# Patient Record
Sex: Female | Born: 1960 | Race: White | Hispanic: No | Smoking: Current every day smoker
Health system: Southern US, Community
[De-identification: ages and names within clinical notes are randomized; demographics above are authoritative.]

## PROBLEM LIST (undated history)

## (undated) DIAGNOSIS — K2970 Gastritis, unspecified, without bleeding: Secondary | ICD-10-CM

## (undated) DIAGNOSIS — R05 Cough: Secondary | ICD-10-CM

## (undated) DIAGNOSIS — M659 Unspecified synovitis and tenosynovitis, unspecified site: Secondary | ICD-10-CM

## (undated) DIAGNOSIS — K30 Functional dyspepsia: Secondary | ICD-10-CM

## (undated) DIAGNOSIS — H269 Unspecified cataract: Secondary | ICD-10-CM

## (undated) DIAGNOSIS — A048 Other specified bacterial intestinal infections: Secondary | ICD-10-CM

## (undated) DIAGNOSIS — I10 Essential (primary) hypertension: Secondary | ICD-10-CM

## (undated) DIAGNOSIS — F419 Anxiety disorder, unspecified: Secondary | ICD-10-CM

## (undated) DIAGNOSIS — T7840XA Allergy, unspecified, initial encounter: Secondary | ICD-10-CM

## (undated) DIAGNOSIS — J40 Bronchitis, not specified as acute or chronic: Secondary | ICD-10-CM

## (undated) DIAGNOSIS — K219 Gastro-esophageal reflux disease without esophagitis: Secondary | ICD-10-CM

## (undated) DIAGNOSIS — F17200 Nicotine dependence, unspecified, uncomplicated: Secondary | ICD-10-CM

## (undated) DIAGNOSIS — F32A Depression, unspecified: Secondary | ICD-10-CM

## (undated) DIAGNOSIS — K648 Other hemorrhoids: Secondary | ICD-10-CM

## (undated) DIAGNOSIS — K8050 Calculus of bile duct without cholangitis or cholecystitis without obstruction: Secondary | ICD-10-CM

## (undated) DIAGNOSIS — K635 Polyp of colon: Secondary | ICD-10-CM

## (undated) DIAGNOSIS — F1020 Alcohol dependence, uncomplicated: Secondary | ICD-10-CM

## (undated) DIAGNOSIS — F329 Major depressive disorder, single episode, unspecified: Secondary | ICD-10-CM

## (undated) DIAGNOSIS — K644 Residual hemorrhoidal skin tags: Secondary | ICD-10-CM

## (undated) DIAGNOSIS — E871 Hypo-osmolality and hyponatremia: Secondary | ICD-10-CM

## (undated) HISTORY — DX: Other specified bacterial intestinal infections: A04.8

## (undated) HISTORY — PX: APPENDECTOMY: SHX54

## (undated) HISTORY — DX: Unspecified cataract: H26.9

## (undated) HISTORY — DX: Gastritis, unspecified, without bleeding: K29.70

## (undated) HISTORY — DX: Cough: R05

## (undated) HISTORY — DX: Depression, unspecified: F32.A

## (undated) HISTORY — DX: Bronchitis, not specified as acute or chronic: J40

## (undated) HISTORY — DX: Unspecified synovitis and tenosynovitis, unspecified site: M65.90

## (undated) HISTORY — DX: Synovitis and tenosynovitis, unspecified: M65.9

## (undated) HISTORY — DX: Gastro-esophageal reflux disease without esophagitis: K21.9

## (undated) HISTORY — DX: Major depressive disorder, single episode, unspecified: F32.9

## (undated) HISTORY — DX: Anxiety disorder, unspecified: F41.9

## (undated) HISTORY — DX: Hypo-osmolality and hyponatremia: E87.1

## (undated) HISTORY — DX: Other hemorrhoids: K64.8

## (undated) HISTORY — DX: Polyp of colon: K63.5

## (undated) HISTORY — DX: Residual hemorrhoidal skin tags: K64.4

## (undated) HISTORY — DX: Allergy, unspecified, initial encounter: T78.40XA

## (undated) HISTORY — DX: Functional dyspepsia: K30

## (undated) HISTORY — DX: Nicotine dependence, unspecified, uncomplicated: F17.200

## (undated) HISTORY — DX: Alcohol dependence, uncomplicated: F10.20

---

## 1998-11-23 ENCOUNTER — Other Ambulatory Visit: Admission: RE | Admit: 1998-11-23 | Discharge: 1998-11-23 | Payer: Self-pay | Admitting: Obstetrics & Gynecology

## 2003-12-02 ENCOUNTER — Other Ambulatory Visit: Admission: RE | Admit: 2003-12-02 | Discharge: 2003-12-02 | Payer: Self-pay | Admitting: Obstetrics & Gynecology

## 2004-03-12 HISTORY — PX: OTHER SURGICAL HISTORY: SHX169

## 2004-04-09 ENCOUNTER — Ambulatory Visit (HOSPITAL_COMMUNITY): Admission: RE | Admit: 2004-04-09 | Discharge: 2004-04-10 | Payer: Self-pay | Admitting: Neurosurgery

## 2004-05-27 ENCOUNTER — Encounter: Admission: RE | Admit: 2004-05-27 | Discharge: 2004-05-27 | Payer: Self-pay | Admitting: Neurosurgery

## 2004-07-15 ENCOUNTER — Encounter: Admission: RE | Admit: 2004-07-15 | Discharge: 2004-07-15 | Payer: Self-pay | Admitting: Neurosurgery

## 2004-08-26 ENCOUNTER — Encounter: Admission: RE | Admit: 2004-08-26 | Discharge: 2004-08-26 | Payer: Self-pay | Admitting: Neurosurgery

## 2006-11-09 ENCOUNTER — Ambulatory Visit (HOSPITAL_COMMUNITY): Admission: RE | Admit: 2006-11-09 | Discharge: 2006-11-09 | Payer: Self-pay | Admitting: *Deleted

## 2006-11-09 ENCOUNTER — Encounter (INDEPENDENT_AMBULATORY_CARE_PROVIDER_SITE_OTHER): Payer: Self-pay | Admitting: Specialist

## 2007-12-09 ENCOUNTER — Encounter: Admission: RE | Admit: 2007-12-09 | Discharge: 2007-12-09 | Payer: Self-pay | Admitting: Neurosurgery

## 2008-03-18 ENCOUNTER — Ambulatory Visit (HOSPITAL_COMMUNITY): Admission: RE | Admit: 2008-03-18 | Discharge: 2008-03-18 | Payer: Self-pay | Admitting: *Deleted

## 2008-03-18 ENCOUNTER — Encounter (INDEPENDENT_AMBULATORY_CARE_PROVIDER_SITE_OTHER): Payer: Self-pay | Admitting: *Deleted

## 2009-03-10 ENCOUNTER — Encounter: Admission: RE | Admit: 2009-03-10 | Discharge: 2009-03-10 | Payer: Self-pay | Admitting: Internal Medicine

## 2009-10-29 ENCOUNTER — Encounter: Admission: RE | Admit: 2009-10-29 | Discharge: 2009-10-29 | Payer: Self-pay | Admitting: Obstetrics & Gynecology

## 2010-06-07 ENCOUNTER — Emergency Department (HOSPITAL_COMMUNITY): Admission: EM | Admit: 2010-06-07 | Discharge: 2010-06-08 | Payer: Self-pay | Admitting: Emergency Medicine

## 2010-10-14 ENCOUNTER — Other Ambulatory Visit: Payer: Self-pay | Admitting: Obstetrics and Gynecology

## 2010-11-19 ENCOUNTER — Other Ambulatory Visit: Payer: Self-pay | Admitting: Occupational Medicine

## 2010-11-19 ENCOUNTER — Ambulatory Visit: Payer: Self-pay

## 2010-11-19 DIAGNOSIS — R52 Pain, unspecified: Secondary | ICD-10-CM

## 2010-11-25 LAB — COMPREHENSIVE METABOLIC PANEL
ALT: 19 U/L (ref 0–35)
AST: 27 U/L (ref 0–37)
Albumin: 4.2 g/dL (ref 3.5–5.2)
Alkaline Phosphatase: 35 U/L — ABNORMAL LOW (ref 39–117)
BUN: 4 mg/dL — ABNORMAL LOW (ref 6–23)
CO2: 20 mEq/L (ref 19–32)
Calcium: 8.7 mg/dL (ref 8.4–10.5)
Chloride: 98 mEq/L (ref 96–112)
Creatinine, Ser: 0.62 mg/dL (ref 0.4–1.2)
GFR calc Af Amer: >60 mL/min (ref >60)
GFR calc non Af Amer: >60 mL/min (ref >60)
Glucose, Bld: 74 mg/dL (ref 70–99)
Potassium: 3.6 mEq/L (ref 3.5–5.1)
Sodium: 132 mEq/L — ABNORMAL LOW (ref 135–145)
Total Bilirubin: 0.7 mg/dL (ref 0.3–1.2)
Total Protein: 6.9 g/dL (ref 6.0–8.3)

## 2010-11-25 LAB — URINALYSIS, ROUTINE W REFLEX MICROSCOPIC
Bilirubin Urine: NEGATIVE
Glucose, UA: NEGATIVE mg/dL
Ketones, ur: NEGATIVE mg/dL
Leukocytes, UA: NEGATIVE
Nitrite: NEGATIVE
Protein, ur: NEGATIVE mg/dL
Specific Gravity, Urine: 1.005 (ref 1.005–1.030)
Urobilinogen, UA: 0.2 mg/dL (ref 0.0–1.0)
pH: 6 (ref 5.0–8.0)

## 2010-11-25 LAB — URINE MICROSCOPIC-ADD ON

## 2010-11-25 LAB — POCT CARDIAC MARKERS
CKMB, poc: <1 ng/mL — ABNORMAL LOW (ref 1.0–8.0)
CKMB, poc: <1 ng/mL — ABNORMAL LOW (ref 1.0–8.0)
Myoglobin, poc: 22.7 ng/mL (ref 12–200)
Myoglobin, poc: 25.8 ng/mL (ref 12–200)
Troponin i, poc: <0.05 ng/mL (ref 0.00–0.09)
Troponin i, poc: <0.05 ng/mL (ref 0.00–0.09)

## 2010-11-25 LAB — RAPID URINE DRUG SCREEN, HOSP PERFORMED
Amphetamines: NOT DETECTED
Barbiturates: NOT DETECTED
Benzodiazepines: POSITIVE — AB
Cocaine: NOT DETECTED
Opiates: NOT DETECTED
Tetrahydrocannabinol: NOT DETECTED

## 2010-11-25 LAB — CBC
HCT: 40.9 % (ref 36.0–46.0)
Hemoglobin: 14.4 g/dL (ref 12.0–15.0)
MCH: 30.8 pg (ref 26.0–34.0)
MCHC: 35.2 g/dL (ref 30.0–36.0)
MCV: 87.4 fL (ref 78.0–100.0)
Platelets: 396 10*3/uL (ref 150–400)
RBC: 4.68 MIL/uL (ref 3.87–5.11)
RDW: 13.4 % (ref 11.5–15.5)
WBC: 12 10*3/uL — ABNORMAL HIGH (ref 4.0–10.5)

## 2010-11-25 LAB — D-DIMER, QUANTITATIVE: D-Dimer, Quant: 0.38 ug/mL-FEU (ref 0.00–0.48)

## 2010-12-09 ENCOUNTER — Observation Stay (HOSPITAL_COMMUNITY): Admit: 2010-12-09 | Payer: Self-pay | Admitting: Internal Medicine

## 2010-12-09 ENCOUNTER — Emergency Department (HOSPITAL_COMMUNITY)
Admission: EM | Admit: 2010-12-09 | Discharge: 2010-12-09 | Disposition: A | Discharge status: Home / Self Care | Payer: BC Managed Care – PPO | Attending: Emergency Medicine | Admitting: Emergency Medicine

## 2010-12-09 DIAGNOSIS — F3289 Other specified depressive episodes: Secondary | ICD-10-CM | POA: Insufficient documentation

## 2010-12-09 DIAGNOSIS — F329 Major depressive disorder, single episode, unspecified: Secondary | ICD-10-CM | POA: Insufficient documentation

## 2010-12-10 ENCOUNTER — Inpatient Hospital Stay (HOSPITAL_COMMUNITY)
Admission: AD | Admit: 2010-12-10 | Discharge: 2010-12-14 | DRG: 751 | Disposition: A | Discharge status: Home / Self Care | Payer: BC Managed Care – PPO | Source: Ambulatory Visit | Attending: Internal Medicine | Admitting: Internal Medicine

## 2010-12-10 ENCOUNTER — Inpatient Hospital Stay (HOSPITAL_COMMUNITY): Payer: BC Managed Care – PPO

## 2010-12-10 DIAGNOSIS — F329 Major depressive disorder, single episode, unspecified: Secondary | ICD-10-CM | POA: Diagnosis present

## 2010-12-10 DIAGNOSIS — Z981 Arthrodesis status: Secondary | ICD-10-CM

## 2010-12-10 DIAGNOSIS — R030 Elevated blood-pressure reading, without diagnosis of hypertension: Secondary | ICD-10-CM | POA: Diagnosis present

## 2010-12-10 DIAGNOSIS — F1020 Alcohol dependence, uncomplicated: Principal | ICD-10-CM | POA: Diagnosis present

## 2010-12-10 DIAGNOSIS — M542 Cervicalgia: Secondary | ICD-10-CM | POA: Diagnosis present

## 2010-12-10 DIAGNOSIS — G4700 Insomnia, unspecified: Secondary | ICD-10-CM | POA: Diagnosis present

## 2010-12-10 DIAGNOSIS — F17200 Nicotine dependence, unspecified, uncomplicated: Secondary | ICD-10-CM | POA: Diagnosis present

## 2010-12-10 DIAGNOSIS — F3289 Other specified depressive episodes: Secondary | ICD-10-CM | POA: Diagnosis present

## 2010-12-10 DIAGNOSIS — F10239 Alcohol dependence with withdrawal, unspecified: Secondary | ICD-10-CM | POA: Diagnosis present

## 2010-12-10 DIAGNOSIS — F10939 Alcohol use, unspecified with withdrawal, unspecified: Secondary | ICD-10-CM | POA: Diagnosis present

## 2010-12-10 DIAGNOSIS — K219 Gastro-esophageal reflux disease without esophagitis: Secondary | ICD-10-CM | POA: Diagnosis present

## 2010-12-10 LAB — BASIC METABOLIC PANEL
BUN: 3 mg/dL — ABNORMAL LOW (ref 6–23)
CO2: 23 mEq/L (ref 19–32)
Chloride: 97 mEq/L (ref 96–112)
Creatinine, Ser: 0.41 mg/dL (ref 0.4–1.2)
Potassium: 4.2 mEq/L (ref 3.5–5.1)

## 2010-12-10 LAB — RAPID URINE DRUG SCREEN, HOSP PERFORMED
Amphetamines: NOT DETECTED
Barbiturates: NOT DETECTED
Benzodiazepines: NOT DETECTED
Cocaine: NOT DETECTED
Opiates: NOT DETECTED
Tetrahydrocannabinol: NOT DETECTED

## 2010-12-10 LAB — DIFFERENTIAL
Basophils Absolute: 0 10*3/uL (ref 0.0–0.1)
Basophils Relative: 0 % (ref 0–1)
Eosinophils Absolute: 0.1 10*3/uL (ref 0.0–0.7)
Eosinophils Relative: 1 % (ref 0–5)
Lymphocytes Relative: 30 % (ref 12–46)
Lymphs Abs: 3.6 10*3/uL (ref 0.7–4.0)
Monocytes Absolute: 0.8 10*3/uL (ref 0.1–1.0)
Monocytes Relative: 7 % (ref 3–12)
Neutro Abs: 7.5 10*3/uL (ref 1.7–7.7)
Neutrophils Relative %: 62 % (ref 43–77)

## 2010-12-10 LAB — CBC
HCT: 42.9 % (ref 36.0–46.0)
Hemoglobin: 14.7 g/dL (ref 12.0–15.0)
MCH: 29.3 pg (ref 26.0–34.0)
MCHC: 34.3 g/dL (ref 30.0–36.0)
MCV: 85.6 fL (ref 78.0–100.0)
Platelets: 377 10*3/uL (ref 150–400)
RBC: 5.01 MIL/uL (ref 3.87–5.11)
RDW: 13.9 % (ref 11.5–15.5)
WBC: 12.2 10*3/uL — ABNORMAL HIGH (ref 4.0–10.5)

## 2010-12-10 LAB — ETHANOL: Alcohol, Ethyl (B): 62 mg/dL — ABNORMAL HIGH (ref 0–10)

## 2010-12-10 LAB — ACETAMINOPHEN LEVEL: Acetaminophen (Tylenol), Serum: <10 ug/mL — ABNORMAL LOW (ref 10–30)

## 2010-12-10 LAB — SALICYLATE LEVEL: Salicylate Lvl: <4 mg/dL (ref 2.8–20.0)

## 2010-12-10 LAB — POCT PREGNANCY, URINE: Preg Test, Ur: NEGATIVE

## 2010-12-11 DIAGNOSIS — F1020 Alcohol dependence, uncomplicated: Secondary | ICD-10-CM

## 2010-12-13 NOTE — Consult Note (Signed)
Selena Farrell, Selena Farrell               ACCOUNT NO.:  [id number]  MEDICAL RECORD NO.:  [id number]           PATIENT TYPE:  I  LOCATION:  1414                         FACILITY:  [facility]  PHYSICIAN:  Eulogio Ditch, MD DATE OF BIRTH:  [birth year]  DATE OF CONSULTATION:  12/11/2010 DATE OF DISCHARGE:                                CONSULTATION   REASON FOR CONSULTATION:  Alcohol dependence and suicidal ideations.  HISTORY OF PRESENT ILLNESS:  A 50 year old white female with history of alcohol dependence.  The patient told me she is regularly drinking since 6 months for 6-7 beers per day.  The patient reported that she is feeling depressed after the death of the husband 4-1/2 years ago. Earlier she was started on Wellbutrin which did not help her, later on she was put on Lexapro which helped her, but she started having increased anxiety and decreased sleep.  The patient is very logical and goal directed during the interview.  She told me that she has ongoing conflict with the brother-in-law.  The patient denied suicidal ideation and she told me that she will never kill herself.  The patient told me that she takes care of her granddaughter who is 35-1/51-year-old and she loves taking care of her and she is one reason that she is never going to hurt herself.  The patient denied any past psych hospitalization or suicide attempt. The patient denies abuse of any illicit drugs.  The patient denies hearing any voices or any visual hallucinations. Denied any manic symptoms.  No flight of ideas present during the interview.  PAST MEDICAL HISTORY:  History of hypertension, GERD.  ALLERGIES:  The patient is allergic to, 1. ASPIRIN. 2. CODEINE. 3. CELEBREX. 4. PREDNISONE.  CURRENT PSYCHIATRIC MEDICATIONS:  The patient is on temazepam 50 mg at bedtime as needed for sleep.  The patient is on Librium detox.  MENTAL STATUS EXAMINATION:  The patient is very calm, cooperative during the  interview, pleasant on approach.  No psychomotor agitation retardation noted during the interview.  No tremors present in the hands.  Hygiene, grooming fair.  Speech normal in rate, rhythm, and volume.  Mood "okay," affect mood congruent.  Thought process, logical and goal directed.  Thought content, not suicidal or homicidal, not delusional. Thought perception, no audiovisual hallucinations reported, not internally preoccupied.  Cognition alert, awake, oriented x3. Memory immediate, recent, remote fair.  Attention and concentration good.  Abstraction ability good.  Insight and judgment intact.  Fund of knowledge fair.  DIAGNOSES:  Axis I:  Chronic alcohol dependence, depressive disorder not otherwise specified, rule out major depressive disorder recurrent type. Axis II:  Deferred. Axis III:  See medical notes. Axis IV:  Chronic alcohol abuse. Axis V:  50.  RECOMMENDATIONS: 1. The patient can be continued on Librium at this time. 2. The patient can be started on Celexa 20 mg p.o. daily for     depressive symptoms. 3. The patient wants counseling in the outpatient setting.  She do not     want any inpatient rehab at this time. 4. Psychoeducation given regarding alcohol abuse.  Thank you for involving me  in taking care of this patient.     Eulogio Ditch, MD     SA/MEDQ  D:  12/11/2010  T:  12/11/2010  Job:  [id number]  Electronically Signed by Eulogio Ditch  on 12/13/2010 02:45:30 PM

## 2010-12-23 NOTE — Discharge Summary (Signed)
NAMELATAJA, NEWLAND               ACCOUNT NO.:  [id number]  MEDICAL RECORD NO.:  [id number]           PATIENT TYPE:  I  LOCATION:  1414                         FACILITY:  [facility]  PHYSICIAN:  Massie Maroon, MD        DATE OF BIRTH:  Jan 29, 1961  DATE OF ADMISSION:  12/10/2010 DATE OF DISCHARGE:  12/14/2010                              DISCHARGE SUMMARY   DISCHARGE DIAGNOSES: 1. Alcohol dependence/in remission. 2. Minor alcohol withdrawal. 3. Depression. 4. Elevated blood pressure without diagnosis of hypertension. 5. Gastroesophageal reflux disease with mild erosions. 6. History of gastritis. 7. Appendectomy. 8. C-section. 9. Neck surgery. 10.Colonoscopy, April 26, 1994 - normal. 11.EGD, April 26, 1994, mild erosions/gastritis.  DISCHARGE MEDICATIONS: 1. Citalopram 20 mg p.o. daily. 2. Vitamin B12 1000 mcg p.o. daily. 3. Folic acid 1 mg p.o. daily. 4. Nicotine patch 21 mg topically daily. 5. Pregabalin 75 mg p.o. b.i.d. 6. Thiamine 100 mg p.o. daily. 7. Tramadol 50 mg p.o. q.i.d. p.r.n. 8. Ativan 0.5 mg p.o. q.6 h. p.r.n. anxiety. 9. Temazepam 15 mg 1-2 p.o. nightly p.r.n. insomnia. 10.Protonix 40 mg p.o. daily  HOSPITAL COURSE:  A 50 year old female with depression over the fact that she is facing financial stress as well as emotional stress from her family.  She is also alcohol dependent and wanted to have detox. The patient did not want psychiatric detox, so admitted to medical floor for detox from alcoholism.  The patient had some shaking over the course of the first 2-3 days and since then has felt better.  She has not had any delirium or seizures.  The patient was initiated on Librium taper and then also covered with Ativan IV.  She has typically been requiring Ativan 1 mg IV t.i.d. to q.i.d. over the last 2 days to help with her shaking.  The patient is also a smoker and has tried to quit smoking during this time.  The patient feels much better.  She has  received Celexa from Psychiatry and feels improved.  She did not voice any suicidal or homicidal ideation at this time.  The patient lives with her daughter and grandchild and has a job and is highly functional.  She has rental property as well.  There is no prior history of suicide.  No family history of suicide in her family according to the patient.  The patient is low risk for suicide at this time.  The patient will follow up with Dr. Pearson Grippe in 1 week for followup of alcohol withdrawal and dependence, now in remission.  PHYSICAL EXAMINATION:  VITAL SIGNS:  Temperature 98.5, pulse 71, blood pressure 109/70, pulse oximetry is 97% on room air. HEENT:  Anicteric. NECK:  No JVD. HEART:  Regular rate and rhythm, S1 and S2.  No murmurs, gallops, or rubs. LUNGS:  Clear to auscultation bilaterally. ABDOMEN:  Soft.  No hepatosplenomegaly. EXTREMITIES:  No cyanosis, clubbing, or edema. SKIN:  No palmar erythema.  No telangiectasias.  No asterixis.  Positive rosacea.  LABORATORY DATA:  On December 09, 2010, WBC 12.2, hemoglobin 14.7, platelet count 377.  Sodium 131, potassium 4.2, BUN  3, creatinine 0.41.  Alcohol level initially 62.  Tylenol level less than 10.0.  Salicylate level less than 4.  Urine pregnancy test was negative.  X-ray of the C-spine showed ACIF at C5-C6 with lucency at the inferior screws, concerning for loosening, comparison to a prior imaging would be helpful to evaluate for stability (I spoke with Dr. Jordan Likes, her Neurosurgeon, on the 2nd day of admission and he recommended that she have outpatient followup.  He reviewed the x-rays and did not feel any surgical emergency at this time.  We obviously appreciate his input).  ASSESSMENT AND PLAN: 1. Alcohol dependence in remission, with minimal symptoms of alcohol     withdrawal during detox, continue Ativan 0.5 mg p.o. q.6 h. p.r.n. anxiety. 1. Neck pain:  Please follow up with Dr. Jordan Likes, your neurosurgeon.     Continue  pregabalin 75 mg p.o. b.i.d. for neck pain and continue     tramadol 50 mg p.o. q.i.d. p.r.n. pain. 2. Tobacco dependence:  The patient was counseled for 10 minutes on     smoking cessation.  She will be prescribed a nicotine patch for     symptoms of nicotine dependence. 3. Depression.  Continue citalopram 20 mg p.o. daily.  Please call to     find a psychiatrist within the next week.  The patient has a list     of psychiatrists that are covered by her insurance.  DISPOSITION:  Follow up with Dr. Pearson Grippe in 1 week.     Massie Maroon, MD     JYK/MEDQ  D:  12/14/2010  T:  12/15/2010  Job:  [id number]  Electronically Signed by Pearson Grippe MD on 12/23/2010 01:50:20 AM

## 2010-12-23 NOTE — H&P (Signed)
Selena Farrell, Selena Farrell               ACCOUNT NO.:  [id number]  MEDICAL RECORD NO.:  [id number]           PATIENT TYPE:  I  LOCATION:  1414                         FACILITY:  [facility]  PHYSICIAN:  Massie Maroon, MD        DATE OF BIRTH:  08-28-1961  DATE OF ADMISSION:  12/10/2010 DATE OF DISCHARGE:                             HISTORY & PHYSICAL   CHIEF COMPLAINT:  Alcohol dependence, "I want to quit drinking."  HISTORY OF PRESENT ILLNESS:  Alcohol Dependence:  The patient states that she wants to quit drinking.  She would like to stop now, but she knows that she will have symptoms of withdrawal.  She would like inpatient admission to have detox as well as evaluation by Psychiatry because of depression.  It is difficult to say whether her depression is from her alcohol abuse or whether she truly has depression at this time. She has thought in the past of taking pills, but does not have any apparent present thoughts of suicidal ideation or homicidal ideation. She slept all yesterday with Librium and has not drunk for 24 hours. The patient also notes some neck pain.  She thinks it is from stress and there is no radiation of the pain down her arms.  She has had prior neck surgery.  She denies any weakness, numbness, tingling.  PAST MEDICAL HISTORY: 1. Elevated BP without diagnosis of hypertension. 2. GERD with mild erosions. 3. History of gastritis.  PAST SURGICAL HISTORY: 1. Appendectomy. 2. C-section. 3. Neck surgery. 4. Colonoscopy April 26, 1994 - normal. 5. EGD April 26, 1994 - mild erosions and gastritis.  SOCIAL HISTORY:  Tobacco Use:  One pack per day for 26 years, alcohol use heavy.  Occupation:  Works at [employer].  Born: [place], [place].  Religion:  [employer].  FAMILY HISTORY:  Father died when the patient was 50 and had hypertension.  Her mother had a stroke at age 69 and passed at the age of 25.  Her maternal aunt had diabetes.  Her  husband passed away from lung cancer, PE, and blood clots.  One sister had a brain aneurysm and she has a family history of alcoholism.  Her father in particular drank heavily.  ALLERGIES: 1. ASPIRIN. 2. CODEINE. 3. CELEBREX. 4. PREDNISONE.  MEDICATIONS: 1. Aciphex 20 mg p.o. daily. 2. Temazepam 50 mg p.o. q.h.s. p.r.n. 3. Ambien 10 mg p.o. q.h.s. p.r.n.  REVIEW OF SYSTEMS:  Negative for all 10-organ systems except for pertinent positives stated above.  PHYSICAL EXAMINATION:  VITAL SIGNS:  Height 5 feet 4-1/2 inches, weight 135 pounds, pulse 84, blood pressure 124/74. HEENT:  Anicteric. NECK:  No JVD, no bruit, no thyromegaly, no adenopathy. HEART:  Regular rate and rhythm, S1, S2.  No murmurs, gallops, or rubs. LUNGS:  Clear to auscultation bilaterally. ABDOMEN:  Soft, nontender, nondistended.  Positive bowel sounds. EXTREMITIES:  No cyanosis, clubbing, or edema. SKIN:  No palmar erythema, no rashes. LYMPH NODES:  No adenopathy. NEUROLOGIC:  Nonfocal.  No asterixis.  LABORATORY DATA:  Alcohol level yesterday December 09, 2010, 65.ASSESSMENT: 1. Alcohol dependence. 2. Depression.  3. Elevated blood pressure without history of hypertension. 4. Gastroesophageal reflux disease/gastritis. 5. Tobacco dependence.  PLAN:  The patient will be placed on Librium 50 mg p.o. daily for 1 day, then 25 mg p.o. daily for 1 day and also on Ativan IV CIWA protocol.  We will consult Psychiatry in the a.m. in regard to depression and possible suggestions on how to manage her depression.  She has been on Lexapro in the past, but this did not seem to agree with her.  We appreciate our psychiatry colleagues input.     Massie Maroon, MD     JYK/MEDQ  D:  12/11/2010  T:  12/11/2010  Job:  [id number]  Electronically Signed by Pearson Grippe MD on 12/23/2010 01:50:29 AM

## 2011-01-25 NOTE — Op Note (Signed)
NAMEQUIERA, Selena Farrell               ACCOUNT NO.:  [id number]   MEDICAL RECORD NO.:  [id number]          PATIENT TYPE:  AMB   LOCATION:  ENDO                         FACILITY:  [facility]   PHYSICIAN:  Georgiana Spinner, M.D.    DATE OF BIRTH:  09/03/1961   DATE OF PROCEDURE:  03/18/2008  DATE OF DISCHARGE:                               OPERATIVE REPORT   PROCEDURE:  Upper endoscopy.   INDICATIONS:  GERD.   ANESTHESIA:  1. Fentanyl 60 mcg.  2. Versed 6 mg.   PROCEDURE:  With the patient mildly sedated in the left lateral  decubitus position, the Pentax videoscopic endoscope was inserted in the  mouth and passed under direct vision through the esophagus, which  appeared normal, until we reached the distal esophagus.  There appeared  to be islands of Barrett's esophagus, photographed, and biopsies were  taken.  The endoscope was advanced into stomach through a hiatal hernia.  Fundus, body, antrum, duodenal bulb, and second portion of the duodenum  all appeared normal.  From this point, the endoscope was slowly  withdrawn, taking circumferential views of duodenal mucosa, until the  endoscope had been pulled back into the stomach and placed in  retroflexion to view the stomach from below.  The endoscope was then  straightened and withdrawn, taking circumferential views of the  remaining gastric and esophageal mucosa.  The patient's vital signs and  pulse oximeter remained stable.  The patient tolerated the procedure  well, without apparent complications.   FINDINGS:  Small islands of Barrett's esophagus.  Biopsies taken above a  hiatal hernia.  Await biopsy report.  The patient will call me for  results and follow up with me as an outpatient.           ______________________________  Georgiana Spinner, M.D.     GMO/MEDQ  D:  03/18/2008  T:  03/18/2008  Job:  [id number]

## 2011-01-28 NOTE — Op Note (Signed)
[name], Selena Farrell               ACCOUNT NO.:  [id number]   MEDICAL RECORD NO.:  [id number]          PATIENT TYPE:  AMB   LOCATION:  ENDO                         FACILITY:  [facility]   PHYSICIAN:  Georgiana Spinner, M.D.    DATE OF BIRTH:  April 19, 1961   DATE OF PROCEDURE:  11/09/2006  DATE OF DISCHARGE:                               OPERATIVE REPORT   PROCEDURE:  Upper endoscopy.   INDICATIONS:  GERD.   ANESTHESIA:  Fentanyl 75 mcg, Versed 7.5 mg.   PROCEDURE:  With patient mildly sedated in the left lateral decubitus  position, the Pentax videoscopic endoscope was inserted in the mouth,  passed under direct vision through the esophagus, which appeared normal  until we reached the distal esophagus and there appeared to be changes  of islands of Barrett, photographed and biopsied.  We entered into the  stomach.  The fundus, body, antrum appeared normal.  The duodenal bulb  showed some erythematous changes that were photographed and biopsied.  The second portion of duodenum appeared normal.  From this point, the  endoscope was slowly withdrawn taking circumferential views of the  duodenal mucosa until the endoscope had been pulled back into the  stomach, placed in retroflexion and viewed the stomach from below.  The  endoscope was straightened and withdrawn, taking circumferential views  of the remaining gastric and esophageal mucosa.  The patient's vital  signs and pulse oximeter remained stable.  The patient tolerated the  procedure well.  No apparent complications.   FINDINGS:  Question of Barrett esophagus, biopsied.  Erythema of  duodenal bulb, biopsied.  Await biopsy reports.  The patient will call  me for results and follow up with me as an outpatient.  Continue  Aciphex, which seems to be treating the patient's abdominal pain  symptoms quite well at this time.           ______________________________  Georgiana Spinner, M.D.     GMO/MEDQ  D:  11/09/2006  T:  11/09/2006  Job:   [id number]

## 2011-01-28 NOTE — Op Note (Signed)
NAMEJAKYLAH, Selena Farrell                           ACCOUNT NO.:  [id number]   MEDICAL RECORD NO.:  [id number]                   PATIENT TYPE:  OIB   LOCATION:  2899                                 FACILITY:  [facility]   PHYSICIAN:  Kathaleen Maser. Pool, M.D.                 DATE OF BIRTH:  08-15-1961   DATE OF PROCEDURE:  04/09/2004  DATE OF DISCHARGE:                                 OPERATIVE REPORT   SURGEON:  Sherilyn Cooter A. Pool, M.D.   SERVICE:  Neurosurgery.   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   PROCEDURE PERFORMED:  C5-C6 anterior discectomy and fusion with allograft  anterior plating.   SURGEON:  Kathaleen Maser. Pool, M.D.   ASSISTANT:  Donzetta Sprung. Wynetta Emery, M.D.   ANESTHESIA:  General.   INDICATIONS FOR PROCEDURE:  Selena Farrell is a 50 year old female with a history  of severe neck and right upper extremity symptoms, paresthesias, and  weakness due to right sided C6 radiculopathy.  She has failed conservative  management.  Workup demonstrates evidence of a large right sided C5-C6 disc  herniation with compression of the spinal cord and exiting right sided C6  nerve root.  The patient has been counseled as to her options.  She has  decided to proceed with a C5-C6 anterior cervical discectomy and fusion with  allograft anterior plating in hopes of improving her symptoms.   PROCEDURE:  The patient was brought to the operating room and placed on the  operating table in the supine position.  After an adequate level of  anesthesia was achieved, the patient was positioned supine with her neck  slightly extended and held in place with the halter traction.  The patient's  anterior cervical region was prepped and draped sterilely.  A 10 blade was  used to make a linear incision overlying the C5-C6 interspace.  This was  carried down sharply to the platysma.  The platysma was  divided vertically  and dissection proceeded along the medial border of the sternocleidomastoid  muscle and carotid sheath.  The trachea and esophagus were mobilized and  retracted toward the left.  The prevertebral fascia was stripped off the  anterior spinal column.  The longus colli muscles were elevated bilaterally  using electrocautery.  Deep self-retaining retractors were placed.  Interoperative fluoroscopy was used and the level was confirmed.  The disc  space at C5-C6 was then incised with a 15 blade.  Wide disc space clean out  was then achieved using pituitary rongeurs, forward and backward Carlens,  Kerrison rongeurs, and a high speed drill.  All elements of the disc were  removed down to the level of the posterior annulus.  The microscope was  brought onto the field and used throughout the remainder of the discectomy.  The remaining aspects of the  annulus and osteophytes were removed using the  high speed drill down to the level of the posterior longitudinal ligament.  The posterior longitudinal ligament was then elevated and resected in  piecemeal fashion using Kerrison rongeurs.  The underlying thecal sac was  identified.  A wide central decompression was then performed by under  cutting the bodies of C5 and C6 using Kerrison rongeurs.  Decompression then  proceeded out to left sided C6 foramen.  The C6 nerve root was identified  proximally and was found to be free of any compression.  Decompression then  proceeded to the right sided C6 foramen.  A large amount of free disc  herniation was encountered along the way and this was completely resected.  A wide anterior foraminotomy was performed along the course of the exiting  C6 nerve root.  There was no evidence of residual disc herniation.  There  was no evidence of injury to the thecal sac or nerve roots.  The wound was  then irrigated with antibiotic solution.  Gelfoam was placed topically and  hemostasis was found to be  good.  A 7 mm patellar wedge allograft was  impacted into place and recessed approximately 1 mm from the anterior  cortical surface.  A 23 mm Atlantis anterior cervical plate was then placed  over the C5 and C6 levels.  This was then attached under fluoroscopic  guidance using 13 mm variable angle screws.  All four screws were identified  and tightened and found to be solid in bone.  Locking screws were engaged at  both levels.  The final images revealed good position of bone grafts and  hardware.  The wound was inspected for hemostasis which was found to be  good.  It was irrigated one final time and then closed in typical fashion.  Steri-Strips and sterile dressing were applied.  There were no  complications.  The patient tolerated the procedure well and she was  returned to the recovery room postop.                                               Henry A. Pool, M.D.    HAP/MEDQ  D:  04/09/2004  T:  04/09/2004  Job:  [id number]

## 2011-04-30 ENCOUNTER — Emergency Department (HOSPITAL_COMMUNITY)
Admission: EM | Admit: 2011-04-30 | Discharge: 2011-05-01 | Disposition: A | Discharge status: Home / Self Care | Payer: BC Managed Care – PPO | Attending: Emergency Medicine | Admitting: Emergency Medicine

## 2011-04-30 DIAGNOSIS — R197 Diarrhea, unspecified: Secondary | ICD-10-CM | POA: Insufficient documentation

## 2011-04-30 DIAGNOSIS — R10812 Left upper quadrant abdominal tenderness: Secondary | ICD-10-CM | POA: Insufficient documentation

## 2011-04-30 DIAGNOSIS — F329 Major depressive disorder, single episode, unspecified: Secondary | ICD-10-CM | POA: Insufficient documentation

## 2011-04-30 DIAGNOSIS — I10 Essential (primary) hypertension: Secondary | ICD-10-CM | POA: Insufficient documentation

## 2011-04-30 DIAGNOSIS — R112 Nausea with vomiting, unspecified: Secondary | ICD-10-CM | POA: Insufficient documentation

## 2011-04-30 DIAGNOSIS — K5289 Other specified noninfective gastroenteritis and colitis: Secondary | ICD-10-CM | POA: Insufficient documentation

## 2011-04-30 DIAGNOSIS — K219 Gastro-esophageal reflux disease without esophagitis: Secondary | ICD-10-CM | POA: Insufficient documentation

## 2011-04-30 DIAGNOSIS — F3289 Other specified depressive episodes: Secondary | ICD-10-CM | POA: Insufficient documentation

## 2011-05-01 LAB — COMPREHENSIVE METABOLIC PANEL
ALT: 17 U/L (ref 0–35)
AST: 22 U/L (ref 0–37)
Albumin: 4.3 g/dL (ref 3.5–5.2)
Alkaline Phosphatase: 40 U/L (ref 39–117)
BUN: 3 mg/dL — ABNORMAL LOW (ref 6–23)
Chloride: 92 mEq/L — ABNORMAL LOW (ref 96–112)
Potassium: 3.8 mEq/L (ref 3.5–5.1)
Sodium: 127 mEq/L — ABNORMAL LOW (ref 135–145)
Total Protein: 7.1 g/dL (ref 6.0–8.3)

## 2011-05-01 LAB — CBC
HCT: 42.2 % (ref 36.0–46.0)
MCH: 31.6 pg (ref 26.0–34.0)
MCV: 85.4 fL (ref 78.0–100.0)
RBC: 4.94 MIL/uL (ref 3.87–5.11)
WBC: 12.7 10*3/uL — ABNORMAL HIGH (ref 4.0–10.5)

## 2011-05-01 LAB — DIFFERENTIAL
Eosinophils Absolute: 0.2 10*3/uL (ref 0.0–0.7)
Eosinophils Relative: 1 % (ref 0–5)
Lymphocytes Relative: 29 % (ref 12–46)
Lymphs Abs: 3.6 10*3/uL (ref 0.7–4.0)
Monocytes Relative: 11 % (ref 3–12)
Neutrophils Relative %: 59 % (ref 43–77)

## 2011-05-01 LAB — URINALYSIS, ROUTINE W REFLEX MICROSCOPIC
Bilirubin Urine: NEGATIVE
Ketones, ur: NEGATIVE mg/dL
Specific Gravity, Urine: 1.009 (ref 1.005–1.030)
pH: 5.5 (ref 5.0–8.0)

## 2011-05-01 LAB — URINE MICROSCOPIC-ADD ON

## 2011-05-01 LAB — LIPASE, BLOOD: Lipase: 20 U/L (ref 11–59)

## 2011-05-14 DIAGNOSIS — K2970 Gastritis, unspecified, without bleeding: Secondary | ICD-10-CM

## 2011-05-14 HISTORY — DX: Gastritis, unspecified, without bleeding: K29.70

## 2011-05-19 ENCOUNTER — Emergency Department (HOSPITAL_COMMUNITY): Payer: BC Managed Care – PPO

## 2011-05-19 ENCOUNTER — Inpatient Hospital Stay (HOSPITAL_COMMUNITY)
Admission: EM | Admit: 2011-05-19 | Discharge: 2011-05-23 | DRG: 183 | Disposition: A | Discharge status: Home / Self Care | Payer: BC Managed Care – PPO | Attending: Internal Medicine | Admitting: Internal Medicine

## 2011-05-19 DIAGNOSIS — F17200 Nicotine dependence, unspecified, uncomplicated: Secondary | ICD-10-CM | POA: Diagnosis present

## 2011-05-19 DIAGNOSIS — F1020 Alcohol dependence, uncomplicated: Secondary | ICD-10-CM | POA: Diagnosis present

## 2011-05-19 DIAGNOSIS — R0789 Other chest pain: Secondary | ICD-10-CM | POA: Diagnosis present

## 2011-05-19 DIAGNOSIS — K2980 Duodenitis without bleeding: Secondary | ICD-10-CM | POA: Diagnosis present

## 2011-05-19 DIAGNOSIS — K2990 Gastroduodenitis, unspecified, without bleeding: Principal | ICD-10-CM | POA: Diagnosis present

## 2011-05-19 DIAGNOSIS — K259 Gastric ulcer, unspecified as acute or chronic, without hemorrhage or perforation: Secondary | ICD-10-CM | POA: Diagnosis present

## 2011-05-19 DIAGNOSIS — K2970 Gastritis, unspecified, without bleeding: Principal | ICD-10-CM | POA: Diagnosis present

## 2011-05-19 DIAGNOSIS — I10 Essential (primary) hypertension: Secondary | ICD-10-CM | POA: Diagnosis present

## 2011-05-19 HISTORY — DX: Essential (primary) hypertension: I10

## 2011-05-19 LAB — CBC
HCT: 45.6 % (ref 36.0–46.0)
Hemoglobin: 16.1 g/dL — ABNORMAL HIGH (ref 12.0–15.0)
MCH: 31.1 pg (ref 26.0–34.0)
RBC: 5.17 MIL/uL — ABNORMAL HIGH (ref 3.87–5.11)

## 2011-05-19 LAB — DIFFERENTIAL
Basophils Relative: 0 % (ref 0–1)
Lymphocytes Relative: 21 % (ref 12–46)
Monocytes Relative: 11 % (ref 3–12)
Neutro Abs: 7.8 10*3/uL — ABNORMAL HIGH (ref 1.7–7.7)
Neutrophils Relative %: 68 % (ref 43–77)

## 2011-05-19 LAB — COMPREHENSIVE METABOLIC PANEL
ALT: 20 U/L (ref 0–35)
Alkaline Phosphatase: 38 U/L — ABNORMAL LOW (ref 39–117)
CO2: 25 mEq/L (ref 19–32)
Calcium: 9.7 mg/dL (ref 8.4–10.5)
GFR calc Af Amer: >60 mL/min (ref >60)
GFR calc non Af Amer: >60 mL/min (ref >60)
Glucose, Bld: 84 mg/dL (ref 70–99)
Potassium: 4.2 mEq/L (ref 3.5–5.1)
Sodium: 132 mEq/L — ABNORMAL LOW (ref 135–145)

## 2011-05-20 ENCOUNTER — Encounter (HOSPITAL_COMMUNITY): Payer: Self-pay | Admitting: Radiology

## 2011-05-20 ENCOUNTER — Inpatient Hospital Stay (HOSPITAL_COMMUNITY): Payer: BC Managed Care – PPO

## 2011-05-20 DIAGNOSIS — R072 Precordial pain: Secondary | ICD-10-CM

## 2011-05-20 LAB — COMPREHENSIVE METABOLIC PANEL
AST: 20 U/L (ref 0–37)
BUN: 9 mg/dL (ref 6–23)
CO2: 28 mEq/L (ref 19–32)
Calcium: 9 mg/dL (ref 8.4–10.5)
Chloride: 101 mEq/L (ref 96–112)
Creatinine, Ser: 0.79 mg/dL (ref 0.50–1.10)
GFR calc Af Amer: >60 mL/min (ref >60)
GFR calc non Af Amer: >60 mL/min (ref >60)
Glucose, Bld: 101 mg/dL — ABNORMAL HIGH (ref 70–99)
Total Bilirubin: 0.6 mg/dL (ref 0.3–1.2)

## 2011-05-20 LAB — CARDIAC PANEL(CRET KIN+CKTOT+MB+TROPI)
CK, MB: 1.7 ng/mL (ref 0.3–4.0)
Total CK: 33 U/L (ref 7–177)
Troponin I: <0.3 ng/mL (ref <0.30)

## 2011-05-20 LAB — D-DIMER, QUANTITATIVE: D-Dimer, Quant: 0.49 ug/mL-FEU — ABNORMAL HIGH (ref 0.00–0.48)

## 2011-05-20 LAB — TSH: TSH: 1.852 u[IU]/mL (ref 0.350–4.500)

## 2011-05-20 MED ORDER — IOHEXOL 300 MG/ML  SOLN
80.0000 mL | Freq: Once | INTRAMUSCULAR | Status: AC | PRN
  Administered 2011-05-20: 80 mL via INTRAVENOUS

## 2011-05-21 ENCOUNTER — Inpatient Hospital Stay (HOSPITAL_COMMUNITY): Payer: BC Managed Care – PPO

## 2011-05-21 DIAGNOSIS — K219 Gastro-esophageal reflux disease without esophagitis: Secondary | ICD-10-CM

## 2011-05-21 DIAGNOSIS — R1013 Epigastric pain: Secondary | ICD-10-CM

## 2011-05-21 DIAGNOSIS — R079 Chest pain, unspecified: Secondary | ICD-10-CM

## 2011-05-21 LAB — CBC
MCH: 30.1 pg (ref 26.0–34.0)
MCHC: 33.6 g/dL (ref 30.0–36.0)
Platelets: 312 10*3/uL (ref 150–400)
RBC: 4.45 MIL/uL (ref 3.87–5.11)

## 2011-05-21 LAB — BASIC METABOLIC PANEL
Calcium: 8.9 mg/dL (ref 8.4–10.5)
GFR calc non Af Amer: >60 mL/min (ref >60)
Potassium: 4.2 mEq/L (ref 3.5–5.1)
Sodium: 136 mEq/L (ref 135–145)

## 2011-05-21 LAB — LIPID PANEL
Cholesterol: 168 mg/dL (ref 0–200)
HDL: 67 mg/dL (ref >39)
Triglycerides: 70 mg/dL (ref <150)

## 2011-05-21 LAB — DIFFERENTIAL
Basophils Absolute: 0 10*3/uL (ref 0.0–0.1)
Basophils Relative: 0 % (ref 0–1)
Eosinophils Absolute: 0.1 10*3/uL (ref 0.0–0.7)
Monocytes Relative: 12 % (ref 3–12)
Neutrophils Relative %: 60 % (ref 43–77)

## 2011-05-22 ENCOUNTER — Other Ambulatory Visit: Payer: Self-pay | Admitting: Internal Medicine

## 2011-05-22 DIAGNOSIS — K219 Gastro-esophageal reflux disease without esophagitis: Secondary | ICD-10-CM

## 2011-05-22 DIAGNOSIS — R1013 Epigastric pain: Secondary | ICD-10-CM

## 2011-05-22 LAB — CBC
HCT: 42.3 % (ref 36.0–46.0)
Hemoglobin: 14.2 g/dL (ref 12.0–15.0)
MCH: 30 pg (ref 26.0–34.0)
MCHC: 33.6 g/dL (ref 30.0–36.0)
MCV: 89.4 fL (ref 78.0–100.0)

## 2011-05-23 DIAGNOSIS — R1013 Epigastric pain: Secondary | ICD-10-CM

## 2011-05-23 DIAGNOSIS — K259 Gastric ulcer, unspecified as acute or chronic, without hemorrhage or perforation: Secondary | ICD-10-CM

## 2011-05-24 ENCOUNTER — Telehealth: Payer: Self-pay | Admitting: *Deleted

## 2011-05-24 NOTE — Telephone Encounter (Signed)
lmom at cell for pt to return our call. Cell [phone]

## 2011-05-25 ENCOUNTER — Telehealth: Payer: Self-pay | Admitting: *Deleted

## 2011-05-25 MED ORDER — CLARITHROMYCIN 500 MG PO TABS: 500.0000 mg | ORAL_TABLET | Freq: Two times a day (BID) | ORAL | Status: AC

## 2011-05-25 MED ORDER — AMOXICILLIN 500 MG PO CAPS: ORAL_CAPSULE | ORAL | Status: DC

## 2011-05-25 NOTE — Telephone Encounter (Signed)
See previous note

## 2011-05-25 NOTE — Telephone Encounter (Signed)
Message copied by Florene Glen on Wed May 25, 2011  8:55 AM ------      Message from: Beverley Fiedler      Created: Wed May 25, 2011  8:49 AM       Aram Beecham      This is a patient I perform an upper endoscopy on last week at the hospital      She had gastritis with a gastric ulcer.      Will you please contact her and let her know that the biopsies reveal H. Pylori.      She will need to continue twice daily PPI (I prescribed this for her during hospital visit), and she will need to be given a prescription for H. pylori therapy (prev-pak, or other is okay).      She does need of followup EGD in 8-12 weeks to ensure healing      Please let her know that completing antibiotic therapy is important      Thanks

## 2011-05-25 NOTE — Telephone Encounter (Signed)
lmom to call back. She needs to schedule a f/u EGD in 8-12 weeks and she is + for H.Pylori

## 2011-05-25 NOTE — Telephone Encounter (Signed)
Informed pt that she was + for H. Pylori and she needs to complete all antibiotics we will order; pt requested [facility]. Notified pt I cannot schedule into November for Dr Rhea Belton, so I will send her info when I can schedule. She is to take the pantoprazole bid with the amoxicillin and clarithromycin. Pt stated understanding.

## 2011-06-10 NOTE — Discharge Summary (Signed)
NAMECOURTNEY, Selena Farrell               ACCOUNT NO.:  [id number]  MEDICAL RECORD NO.:  [id number]  LOCATION:  4733                         FACILITY:  [facility]  PHYSICIAN:  Richarda Overlie, MD       DATE OF BIRTH:  Nov 16, 1960  DATE OF ADMISSION:  05/19/2011 DATE OF DISCHARGE:  05/23/2011                              DISCHARGE SUMMARY   PRIMARY CARE PHYSICIAN:  Massie Maroon, MD  DISCHARGE DIAGNOSES: 1. Atypical chest pain likely secondary to gastritis. 2. Epigastric pain secondary to antral ulcer, gastritis, and     duodenitis in the proximal part of the duodenum status post     endoscopy. 3. Hypertension. 4. Nicotine dependence. 5. Alcohol dependence. 6. Alcohol dependence without any signs of withdrawal. 7. History of depression. 8. History of appendectomy, C-section.  SURGERIES:  Colonoscopy in 1995, EGD in 1995, repeat EGD in September 2012.  DISCHARGE MEDICATIONS: 1. Atenolol 25 mg p.o. daily. 2. Folic acid 1 mg p.o. daily. 3. Protonix 40 mg p.o. twice daily. 4. Thiamine 100 mg p.o. daily. 5. Ativan 0.5 mg p.o. q.6 p.r.n. 6. Celexa 20 mg p.o. daily. 7. Nicotine patch 21 mg per 24 hours. 8. Zofran 8 mg p.o. twice daily. 9. Temazepam 15 mg 1-2 capsules p.o. daily. 10.Tramadol 50 mg 1 tablet 4 times a day as needed.  SUBJECTIVE:  This is a 50 year old female who presents to the ER with a chief complaint of chest pain, epigastric in origin radiating into the chest associated with some nausea, diaphoresis, relieved with sublingual nitro.  The patient had a relatively negative workup in the ER including a negative EKG, a negative chest x-ray.  She did have some mild ST elevation with sloping into 3 and AVF.  She was admitted to the hospital for further evaluation.  HOSPITAL COURSE: 1. Chest pain.  Cardiac enzymes were cycled and were found to be     negative.  She was evaluated for risk factors and her cholesterol     panel was found to be within normal limits.  The patient also  had a     2-D echo done that did not show any wall motion abnormalities and     she had an EF of 60-65%.  The patient also had a D-dimer that was     mildly elevated at 0.49.  Subsequently she had a CT angio of the     chest that showed mild motion degradation, no evidence of pulmonary     embolism or aortic dissection.  She also had an ultrasound of the     abdomen that showed prominence of the common bile duct but her     lipase and liver function tests were within normal limits. 2. The patient had a GI consultation by [facility] and had an     endoscopy done that showed that the patient had gastritis,     duodenitis, and antral ulcer.  Explaining her symptoms, the patient     is recommended to be on a PPI for 8-12 weeks.  She would need     repeat GI consultation with [facility] and possibly a repeat endoscopy     to ensure healing  of her ulcers. 3. Alcohol dependence.  The patient was placed on CIWA protocol     without any signs of withdrawal.  The patient can continue with     folic acid, thiamine, and p.r.n. Ativan.  FOLLOWUP PLAN AND RECOMMENDATIONS:  Follow up with PCP in 5-7 days. Follow up with  GI in 3-4 weeks.  The patient will need an outpatient mammography to be arranged by the primary care provider.  She will need an outpatient adenosine Myoview to be arranged by the primary care provider.  She has been advised not to be on any aspirin, ibuprofen, or NSAIDs and the mefenamic acid has been discontinued.     Richarda Overlie, MD     NA/MEDQ  D:  05/23/2011  T:  05/23/2011  Job:  [id number]  Electronically Signed by Richarda Overlie MD on 06/10/2011 07:15:07 AM

## 2011-06-13 ENCOUNTER — Telehealth: Payer: Self-pay | Admitting: Internal Medicine

## 2011-06-13 NOTE — Telephone Encounter (Signed)
Either office visit with me or extender Thanks

## 2011-06-13 NOTE — Telephone Encounter (Signed)
Dr Rhea Belton saw pt in the hospital and did an EGD showing Gastritis and + for H.Pylori. She is to have a repeat EGD in 8-12 weeks. Pt reports she completed the antibiotic last Tuesday and she remains on Pantoprazole bid. Yesterday she woke up with her stomach hurting like it did when she went to the hospital. She reports being nauseated, her BP was up and she can't eat. [facility] was [facility] when I spoke with her this am checking her BP- 148/91. No BM in 2 days but a lot of gas. She can't sleep and the tramadol isn't helping her pain. She was on Amoxicillin and Biaxan for H. Pylori. Please advise.

## 2011-06-14 ENCOUNTER — Other Ambulatory Visit (INDEPENDENT_AMBULATORY_CARE_PROVIDER_SITE_OTHER): Payer: BC Managed Care – PPO

## 2011-06-14 ENCOUNTER — Encounter: Payer: Self-pay | Admitting: Nurse Practitioner

## 2011-06-14 ENCOUNTER — Telehealth: Payer: Self-pay | Admitting: Internal Medicine

## 2011-06-14 ENCOUNTER — Ambulatory Visit (INDEPENDENT_AMBULATORY_CARE_PROVIDER_SITE_OTHER): Payer: BC Managed Care – PPO | Admitting: Nurse Practitioner

## 2011-06-14 VITALS — BP 128/64 | HR 72 | Ht 64.0 in | Wt 131.0 lb

## 2011-06-14 DIAGNOSIS — R1013 Epigastric pain: Secondary | ICD-10-CM | POA: Insufficient documentation

## 2011-06-14 DIAGNOSIS — A048 Other specified bacterial intestinal infections: Secondary | ICD-10-CM

## 2011-06-14 DIAGNOSIS — K279 Peptic ulcer, site unspecified, unspecified as acute or chronic, without hemorrhage or perforation: Secondary | ICD-10-CM

## 2011-06-14 DIAGNOSIS — F17200 Nicotine dependence, unspecified, uncomplicated: Secondary | ICD-10-CM

## 2011-06-14 DIAGNOSIS — Z720 Tobacco use: Secondary | ICD-10-CM

## 2011-06-14 DIAGNOSIS — K2970 Gastritis, unspecified, without bleeding: Secondary | ICD-10-CM

## 2011-06-14 DIAGNOSIS — B9681 Helicobacter pylori [H. pylori] as the cause of diseases classified elsewhere: Secondary | ICD-10-CM | POA: Insufficient documentation

## 2011-06-14 LAB — CBC WITH DIFFERENTIAL/PLATELET
Basophils Absolute: 0 10*3/uL (ref 0.0–0.1)
Basophils Relative: 0.2 % (ref 0.0–3.0)
Eosinophils Absolute: 0 10*3/uL (ref 0.0–0.7)
MCHC: 33.5 g/dL (ref 30.0–36.0)
MCV: 91.3 fl (ref 78.0–100.0)
Monocytes Absolute: 0.9 10*3/uL (ref 0.1–1.0)
Neutrophils Relative %: 84.2 % — ABNORMAL HIGH (ref 43.0–77.0)
Platelets: 293 10*3/uL (ref 150.0–400.0)
RDW: 13.3 % (ref 11.5–14.6)

## 2011-06-14 LAB — COMPREHENSIVE METABOLIC PANEL
AST: 72 U/L — ABNORMAL HIGH (ref 0–37)
Albumin: 4.4 g/dL (ref 3.5–5.2)
Alkaline Phosphatase: 125 U/L — ABNORMAL HIGH (ref 39–117)
BUN: 7 mg/dL (ref 6–23)
Potassium: 4.4 mEq/L (ref 3.5–5.1)
Sodium: 138 mEq/L (ref 135–145)

## 2011-06-14 LAB — LIPASE: Lipase: 20 U/L (ref 11.0–59.0)

## 2011-06-14 MED ORDER — HYDROCODONE-ACETAMINOPHEN 5-325 MG PO TABS: 1.0000 | ORAL_TABLET | Freq: Four times a day (QID) | ORAL | Status: AC | PRN

## 2011-06-14 MED ORDER — SUCRALFATE 1 GM/10ML PO SUSP: 1.0000 g | Freq: Four times a day (QID) | ORAL | Status: DC

## 2011-06-14 NOTE — Assessment & Plan Note (Signed)
Plans cessation on November 1st.

## 2011-06-14 NOTE — Progress Notes (Signed)
Selena Farrell [id number] 10/23/1960   HISTORY OR PRESENT ILLNESS : Patient is a 50 year old female hospitalized a couple of days early last month with nausea, vomiting, abdominal pain and chest pains. Her CBC on admission was mildly elevated at 11.5. Hemoglobin normal. Her liver enzyme studies were normal. Lipase normal at 15. She had upper endoscopy by Dr. Rhea Belton which showed several small isolated islands of salmon-colored mucosa. A clean-based superficial ulcer was found in the antrum, moderate gastritis. Biopsies were positive for H. pylori, patient completed the prescribed treatment. Her pain resolved but recurred Sunday and yeserday she developed nausea and vomiting (non-blodoy emesis).Taking Protonix twice daily, no NSAIDS. Ultram not even "touching the pain". No BMs okay though no BM in 3 days. History of ETOH, none since prior to hospital admission.   Current Medications, Allergies, Past Medical History, Past Surgical History, Family History and Social History were reviewed in [employer] record.   PHYSICAL EXAMINATION : General: Well developed  female in no acute distress Head: Normocephalic and atraumatic Eyes:  sclerae anicteric,conjunctive pink. Ears: Normal auditory acuity Mouth: No deformity or lesions Neck: Supple, no masses.  Lungs: Clear throughout to auscultation Heart: Regular rate and rhythm; no murmurs heard Abdomen: Soft, nondistended, moderate RUQ tenderness. No masses or hepatomegaly noted. Normal bowel sounds Rectal: not done Musculoskeletal: Symmetrical with no gross deformities  Skin: No lesions on visible extremities Extremities: No edema or deformities noted Neurological: Alert oriented x 4, grossly nonfocal Cervical Nodes:  No significant cervical adenopathy Psychological:  Alert and cooperative. Emotional.   ASSESSMENT AND PLAN :

## 2011-06-14 NOTE — Assessment & Plan Note (Signed)
She completed antibiotics last Tuesday. Will check stool H. pylori antigen for eradication.

## 2011-06-14 NOTE — Telephone Encounter (Signed)
Pt reports she felt better last pm, but today she woke up with nausea again. Pt to see Selena Cluster, NP at 2pm.

## 2011-06-14 NOTE — Assessment & Plan Note (Addendum)
Recurrent epigastric pain, severe and associated with nausea and vomiting. Pain reminiscent of when she was recently hospitalized and found to have a clean based gastric ulcer. She successfully treated H.Pylori treatment, is still on twice daily PPI so doubtful PUD causing all this pain.  U/S in the hospital was negative for stones but the CBD was 12mm which needs to be evaluated. Will give her Vicodin for pain, Zofran for nausea. Add Carafate. Her LFTs were normal in hospital but need to recheck. Also check Lipase and CBC. If LFTs are elevated will obtain MRCP, if LFTs abnormal may move on to ERCP for evaluation of upper abdominal pain and enlarged CBD. Will call her with test results and further recommendations.

## 2011-06-14 NOTE — Patient Instructions (Addendum)
Please go to the basement level to have your labs drawn.   You have been given a separate informational sheet regarding your tobacco use, the importance of quitting and local resources to help you quit.  We [facility]ed a prescription [facility]odin to [facility].  We have given you the prescription to take to the pharmacy.   We sent a prescription for Carafate to take 4 times daily, with meals and at bedtime.

## 2011-06-15 ENCOUNTER — Encounter: Payer: Self-pay | Admitting: Nurse Practitioner

## 2011-06-15 DIAGNOSIS — K279 Peptic ulcer, site unspecified, unspecified as acute or chronic, without hemorrhage or perforation: Secondary | ICD-10-CM | POA: Insufficient documentation

## 2011-06-15 NOTE — Assessment & Plan Note (Signed)
Clean based antral ulcer on recent EGD. H.Pylori positive, treatment complete. Continue BID PPI until evaluation of recurrent upper abdominal pain is complete.

## 2011-06-16 ENCOUNTER — Encounter: Payer: Self-pay | Admitting: Nurse Practitioner

## 2011-06-16 ENCOUNTER — Telehealth: Payer: Self-pay | Admitting: *Deleted

## 2011-06-16 DIAGNOSIS — R1013 Epigastric pain: Secondary | ICD-10-CM

## 2011-06-16 NOTE — Progress Notes (Signed)
Reviewed and agree.

## 2011-06-16 NOTE — Telephone Encounter (Signed)
Received a call from [name], NP that patient needs to have MRCP set up due to abnormal labs, abd pain, abnormal ultrasound.  Scheduled patient on 06/23/11 11:45 AM arrival for 12:00 PM procedure. NPO 4 hours prior. Patient does have surgical screws in her neck. Tobi Bastos with radiology scheduling aware and made a note of this. Spoke with patient and gave her the instruction. She states she is still hurting and the pain medication is only lasting 3 hours. She has not had a bowel movement in 3 days. She is not sure she can go back to work tomorrow with the way she is feeling today.

## 2011-06-17 ENCOUNTER — Telehealth: Payer: Self-pay | Admitting: Nurse Practitioner

## 2011-06-17 ENCOUNTER — Other Ambulatory Visit: Payer: Self-pay | Admitting: Nurse Practitioner

## 2011-06-17 ENCOUNTER — Telehealth: Payer: Self-pay | Admitting: *Deleted

## 2011-06-17 ENCOUNTER — Other Ambulatory Visit: Payer: BC Managed Care – PPO

## 2011-06-17 DIAGNOSIS — Z720 Tobacco use: Secondary | ICD-10-CM

## 2011-06-17 DIAGNOSIS — R7989 Other specified abnormal findings of blood chemistry: Secondary | ICD-10-CM

## 2011-06-17 DIAGNOSIS — R1013 Epigastric pain: Secondary | ICD-10-CM

## 2011-06-17 DIAGNOSIS — B9681 Helicobacter pylori [H. pylori] as the cause of diseases classified elsewhere: Secondary | ICD-10-CM

## 2011-06-17 DIAGNOSIS — R945 Abnormal results of liver function studies: Secondary | ICD-10-CM

## 2011-06-17 MED ORDER — ACETAMINOPHEN-CODEINE #3 300-30 MG PO TABS: 1.0000 | ORAL_TABLET | ORAL | Status: DC | PRN

## 2011-06-17 MED ORDER — CIPROFLOXACIN HCL 500 MG PO TABS: 500.0000 mg | ORAL_TABLET | Freq: Two times a day (BID) | ORAL | Status: AC

## 2011-06-17 NOTE — Telephone Encounter (Signed)
-----   Message ----- From: Hart Carwin, MD Sent: 06/16/2011 10:35 PM To: Meredith Pel, NP Subject: abnl LFT's   I agree to start Cipro 500mg  po bid, repeat LFT on Monday 108/2012, Tylenol #3 #15 1 po q 4 hrs prn pain, Call if chiils or fever ----- Message ----- From: Meredith Pel, NP Sent: 06/16/2011 5:57 PM To: Hart Carwin, MD  Verlee Monte, I sent you my note on Selena Farrell who i saw Tuesday. Her lfts are mildly elevated but they were normal in hospital recently. Also her white count is over 14 now. She has significant RUQ pain and tenderness with a CBD of 12mm. No stones or other findings on ultrasound. [name] couldn't get her MRCP scheduled any sooner than next Thursday but I am not comfortable with that. I wonder about acalculous cholecystitis though not sure why CBD would be enlarged. Do you think we should cover her with some antibiotics for now? Thanks            Forwarded by:     Meredith Pel Date: 06/17/2011         Spoke with patient and gave her recommendations. She will use Miralax or enema for constipation as per [name], NP. Rx sent to pharmacy

## 2011-06-17 NOTE — Telephone Encounter (Signed)
I spoke to the pt after [name] had called her with the information from Dr. Juanda Chance, the prescription for Tylenol #3 and the antibiotic.  The pt had just used an enema right before I called her.  She was able to get some stool out with the enema.  She did fill some stool in the stool cup the lab gave her. She wasn't sure it was enough. I told her I would call the lab.  The lab told me if she has the blue top bottle, she doesn't need to worry as long as she has some stool in the bottle.  If it is the [place], [place] or [place], there is liquid in the bottle and the stool will bring the liquid up to the red line.  The pt has the blue top stool bottle and her husband is bringing it to the lab today .I spoke to [place] and she said it is okay for the pt to do another enema this evening and she can use the Miralax or the similar product the hospital gave her to move her bowels 1-2 times a day until she gets into a rhythm of moving her bowels.

## 2011-06-18 NOTE — H&P (Signed)
Selena Farrell, Selena Farrell NO.:  [id number]  MEDICAL RECORD NO.:  [id number]  LOCATION:  [facility]                         FACILITY:  [facility]  PHYSICIAN:  Houston Siren, MD           DATE OF BIRTH:  02/18/1961  DATE OF ADMISSION:  05/19/2011 DATE OF DISCHARGE:                             HISTORY & PHYSICAL   PRIMARY CARE PHYSICIAN:  Massie Maroon, MD  REASON FOR ADMISSION:  Chest pain.  ADVANCE DIRECTIVE:  Full code.  HISTORY OF PRESENT ILLNESS:  This is a 50 year old female with history of Barrett esophagitis, prior ulcer many years ago, hypertension, tobacco use, presents to the emergency room with substernal chest pain that was severe at rest today, accompanied with diaphoresis and nausea. She had relief with 3 sublingual nitroglycerins.  Currently, she is rather pain-free.  Workup included an unremarkable EKG and negative cardiac markers along with a clear chest x-ray.  Her EKG specifically showed sinus rhythm at 80 with axis of 60 degrees.  There is a vague suggestion of ST elevation with sloping in II, III aVF through the precordial leads.  Hospitalist was asked to admit the patient for rule out.  Though she said she was allergic to ASPIRIN, she was told not to take aspirin because of previous ulcer.  She had an EEG done 3 years ago reportedly was negative except for Barrett esophagitis.  PAST MEDICAL HISTORY:  As above.  FAMILY HISTORY:  No family history of coronary artery disease.  SOCIAL HISTORY:  She is a widow.  She does smoke, but denies alcohol or drug use.  She has 2 children.  PHYSICAL EXAMINATION:  VITAL SIGNS:  Blood pressure 122/70, previous blood pressure 195/112, pulse of 80, respiratory rate of 16, afebrile. GENERAL:  She is alert and oriented and in no apparent distress. HEENT:  She has facial symmetry and fluent speech.  Tongue is midline. No carotid bruit. NECK:  Supple.  She has a scar from her neck surgery anteriorly, well healed. CARDIAC:   S1 and S2 regular.  I did not hear any friction rub. LUNGS:  Clear.  No wheezes, rales or any evidence of consolidation. ABDOMEN:  Soft, nondistended, and nontender. EXTREMITIES:  No edema.  Good distal pulses.  No calf tenderness. SKIN:  Warm and dry.  LABORATORY STUDY:  White count 11.5 thousand, hemoglobin 16.1. Creatinine of 0.73, potassium of 4.2, serum sodium 132.  Chest x-ray shows no acute cardiopulmonary abnormality.  Troponin less than 0.3.  IMPRESSION:  This is a 50 year old female with chest pain and increased cardiac risk factors, admitted for atypical chest pain.  Her EKG  gaves a hint of pericarditis, but her symptoms are not consistent with that of the diagnosis.  I will give her aspirin and Protonix.  Because of her hypertension and her getting cardiac ruled out, we will go ahead and start her on atenolol 25 mg per day.  I like to add nitro paste 1 inch q.8 h. to her chest wall.  We will get an echo along with fasting lipid profile.  She must stop smoking and should continue her nicotine patch.  We will admit her to  [facility].  She is a full code.     Houston Siren, MD     PL/MEDQ  D:  05/20/2011  T:  05/20/2011  Job:  [id number]  cc:   Massie Maroon, MD  Electronically Signed by Houston Siren  on 06/18/2011 01:50:59 AM

## 2011-06-20 ENCOUNTER — Other Ambulatory Visit (INDEPENDENT_AMBULATORY_CARE_PROVIDER_SITE_OTHER): Payer: BC Managed Care – PPO

## 2011-06-20 ENCOUNTER — Other Ambulatory Visit: Payer: Self-pay | Admitting: *Deleted

## 2011-06-20 DIAGNOSIS — R7989 Other specified abnormal findings of blood chemistry: Secondary | ICD-10-CM

## 2011-06-20 DIAGNOSIS — R945 Abnormal results of liver function studies: Secondary | ICD-10-CM

## 2011-06-20 LAB — HEPATIC FUNCTION PANEL
ALT: 46 U/L — ABNORMAL HIGH (ref 0–35)
Total Protein: 6.4 g/dL (ref 6.0–8.3)

## 2011-06-20 MED ORDER — ACETAMINOPHEN-CODEINE #3 300-30 MG PO TABS: 1.0000 | ORAL_TABLET | ORAL | Status: AC | PRN

## 2011-06-20 NOTE — Telephone Encounter (Signed)
Pt came to our office today , she had been down to the lab this morning.  I asked her to call me with a condition update.  She is a little better than when she was here last week but still in pain.  She did finish the Tylenol #3 Dr Juanda Chance has prescribed and the vicodin [name] ACNP prescribed last week.  Gunnar Fusi did agree to prescribe today # 20 no refills on the Tylenol #3.  I [facility]ed a signed [facility] to [facility].  I discussed with the pt to continue the Purilax ( Miralax) twice daily Am & PM.  She should also continue enemas 1-2 daily to try to empty bowels.  She is able to move bowels after an enema but stools very dark.  She is seeing some BRB but she is certain that is from the hemorrhoids.  She is using suppository at bedtime and rectal cream during the day. I reminded her of her appt on Thurs 06-23-2011 for the MR/MRA.  She asked me about taking time off , she said she cannot work feeling this way.  I told her she would have to discuss this with her place of employment. If they give her paperwork she should take to our department in our basement level that would fill out the forms. They would then send them up here for [facility] to look over and review. If she agrees with time off for the patient she would sign them.

## 2011-06-21 ENCOUNTER — Inpatient Hospital Stay (HOSPITAL_COMMUNITY): Payer: BC Managed Care – PPO

## 2011-06-21 ENCOUNTER — Other Ambulatory Visit: Payer: Self-pay | Admitting: *Deleted

## 2011-06-21 ENCOUNTER — Inpatient Hospital Stay (HOSPITAL_COMMUNITY)
Admission: AD | Admit: 2011-06-21 | Discharge: 2011-06-26 | DRG: 183 | Disposition: A | Discharge status: Home / Self Care | Payer: BC Managed Care – PPO | Source: Ambulatory Visit | Attending: Internal Medicine | Admitting: Internal Medicine

## 2011-06-21 ENCOUNTER — Telehealth: Payer: Self-pay | Admitting: *Deleted

## 2011-06-21 ENCOUNTER — Telehealth: Payer: Self-pay | Admitting: Internal Medicine

## 2011-06-21 DIAGNOSIS — K2970 Gastritis, unspecified, without bleeding: Principal | ICD-10-CM | POA: Diagnosis present

## 2011-06-21 DIAGNOSIS — I10 Essential (primary) hypertension: Secondary | ICD-10-CM | POA: Diagnosis present

## 2011-06-21 DIAGNOSIS — K5900 Constipation, unspecified: Secondary | ICD-10-CM | POA: Diagnosis present

## 2011-06-21 DIAGNOSIS — K8020 Calculus of gallbladder without cholecystitis without obstruction: Secondary | ICD-10-CM | POA: Diagnosis present

## 2011-06-21 DIAGNOSIS — Z79899 Other long term (current) drug therapy: Secondary | ICD-10-CM

## 2011-06-21 DIAGNOSIS — Z23 Encounter for immunization: Secondary | ICD-10-CM

## 2011-06-21 DIAGNOSIS — F1010 Alcohol abuse, uncomplicated: Secondary | ICD-10-CM | POA: Diagnosis present

## 2011-06-21 DIAGNOSIS — F411 Generalized anxiety disorder: Secondary | ICD-10-CM | POA: Diagnosis present

## 2011-06-21 DIAGNOSIS — R1013 Epigastric pain: Secondary | ICD-10-CM

## 2011-06-21 DIAGNOSIS — Z6379 Other stressful life events affecting family and household: Secondary | ICD-10-CM

## 2011-06-21 DIAGNOSIS — F329 Major depressive disorder, single episode, unspecified: Secondary | ICD-10-CM | POA: Diagnosis present

## 2011-06-21 DIAGNOSIS — R112 Nausea with vomiting, unspecified: Secondary | ICD-10-CM

## 2011-06-21 DIAGNOSIS — F3289 Other specified depressive episodes: Secondary | ICD-10-CM | POA: Diagnosis present

## 2011-06-21 LAB — HELICOBACTER PYLORI  SPECIAL ANTIGEN

## 2011-06-21 LAB — CBC
Hemoglobin: 13.4 g/dL (ref 12.0–15.0)
Platelets: 330 10*3/uL (ref 150–400)
RBC: 4.35 MIL/uL (ref 3.87–5.11)
WBC: 11.1 10*3/uL — ABNORMAL HIGH (ref 4.0–10.5)

## 2011-06-21 LAB — COMPREHENSIVE METABOLIC PANEL
AST: 32 U/L (ref 0–37)
Albumin: 3.4 g/dL — ABNORMAL LOW (ref 3.5–5.2)
Alkaline Phosphatase: 263 U/L — ABNORMAL HIGH (ref 39–117)
Chloride: 100 mEq/L (ref 96–112)
Potassium: 3.5 mEq/L (ref 3.5–5.1)
Total Bilirubin: 0.3 mg/dL (ref 0.3–1.2)
Total Protein: 6.4 g/dL (ref 6.0–8.3)

## 2011-06-21 LAB — PROTIME-INR
INR: 0.94 (ref 0.00–1.49)
Prothrombin Time: 12.8 seconds (ref 11.6–15.2)

## 2011-06-21 NOTE — Telephone Encounter (Signed)
Pt will go to [facility] before 5pm. Faxed admission orders to nurse's station at [phone], [phone] and to admitting [phone]

## 2011-06-21 NOTE — Telephone Encounter (Signed)
Notified pt that we are admitting her to [facility]; we will fax admission orders. Pt stated understanding.

## 2011-06-21 NOTE — Telephone Encounter (Signed)
lmom for pt to call back. Spoke with both Dr Rhea Belton and [name], NP about pt. Gunnar Fusi will consider a Direct Admit because MRCP can't be done any earlier per scheduling.

## 2011-06-22 ENCOUNTER — Inpatient Hospital Stay (HOSPITAL_COMMUNITY): Payer: BC Managed Care – PPO

## 2011-06-22 DIAGNOSIS — R1013 Epigastric pain: Secondary | ICD-10-CM

## 2011-06-22 DIAGNOSIS — R112 Nausea with vomiting, unspecified: Secondary | ICD-10-CM

## 2011-06-22 DIAGNOSIS — R932 Abnormal findings on diagnostic imaging of liver and biliary tract: Secondary | ICD-10-CM

## 2011-06-22 LAB — COMPREHENSIVE METABOLIC PANEL WITH GFR
ALT: 35 U/L (ref 0–35)
AST: 31 U/L (ref 0–37)
Albumin: 3 g/dL — ABNORMAL LOW (ref 3.5–5.2)
Alkaline Phosphatase: 232 U/L — ABNORMAL HIGH (ref 39–117)
BUN: 5 mg/dL — ABNORMAL LOW (ref 6–23)
CO2: 27 meq/L (ref 19–32)
Calcium: 9 mg/dL (ref 8.4–10.5)
Chloride: 101 meq/L (ref 96–112)
Creatinine, Ser: 0.72 mg/dL (ref 0.50–1.10)
GFR calc Af Amer: >90 mL/min
GFR calc non Af Amer: >90 mL/min
Glucose, Bld: 96 mg/dL (ref 70–99)
Potassium: 4.1 meq/L (ref 3.5–5.1)
Sodium: 138 meq/L (ref 135–145)
Total Bilirubin: 0.3 mg/dL (ref 0.3–1.2)
Total Protein: 6 g/dL (ref 6.0–8.3)

## 2011-06-22 LAB — CBC
HCT: 37.6 % (ref 36.0–46.0)
Hemoglobin: 12.6 g/dL (ref 12.0–15.0)
MCHC: 33.5 g/dL (ref 30.0–36.0)
RBC: 4.19 MIL/uL (ref 3.87–5.11)

## 2011-06-22 LAB — LIPASE, BLOOD: Lipase: 19 U/L (ref 11–59)

## 2011-06-22 LAB — HEPATITIS A ANTIBODY, TOTAL: Hep A Total Ab: NEGATIVE

## 2011-06-22 LAB — HEPATITIS B SURFACE ANTIBODY,QUALITATIVE

## 2011-06-22 LAB — HEPATITIS C ANTIBODY: HCV Ab: NEGATIVE

## 2011-06-22 MED ORDER — GADOBENATE DIMEGLUMINE 529 MG/ML IV SOLN
15.0000 mL | Freq: Once | INTRAVENOUS | Status: AC
  Administered 2011-06-22: 15 mL via INTRAVENOUS

## 2011-06-23 ENCOUNTER — Ambulatory Visit (HOSPITAL_COMMUNITY)
Admission: RE | Admit: 2011-06-23 | Discharge: 2011-06-23 | Disposition: A | Discharge status: Home / Self Care | Payer: BC Managed Care – PPO | Source: Ambulatory Visit | Attending: Internal Medicine | Admitting: Internal Medicine

## 2011-06-23 ENCOUNTER — Other Ambulatory Visit (HOSPITAL_COMMUNITY): Payer: BC Managed Care – PPO

## 2011-06-23 ENCOUNTER — Inpatient Hospital Stay (HOSPITAL_COMMUNITY): Payer: BC Managed Care – PPO

## 2011-06-23 DIAGNOSIS — K8050 Calculus of bile duct without cholangitis or cholecystitis without obstruction: Secondary | ICD-10-CM

## 2011-06-23 HISTORY — DX: Calculus of bile duct without cholangitis or cholecystitis without obstruction: K80.50

## 2011-06-23 HISTORY — PX: ERCP W/ SPHICTEROTOMY: SHX1523

## 2011-06-25 DIAGNOSIS — R1013 Epigastric pain: Secondary | ICD-10-CM

## 2011-06-25 DIAGNOSIS — R112 Nausea with vomiting, unspecified: Secondary | ICD-10-CM

## 2011-06-25 DIAGNOSIS — R932 Abnormal findings on diagnostic imaging of liver and biliary tract: Secondary | ICD-10-CM

## 2011-06-27 ENCOUNTER — Telehealth: Payer: Self-pay | Admitting: *Deleted

## 2011-06-27 NOTE — Telephone Encounter (Signed)
Phoned pt to schedule her appt and she had called this am to schedule; pt will come 07/07/11 at 3pm. Pt stated she feels so much better and will call for further questions or problems.

## 2011-06-29 ENCOUNTER — Ambulatory Visit: Payer: BC Managed Care – PPO | Admitting: Internal Medicine

## 2011-06-30 ENCOUNTER — Encounter: Payer: Self-pay | Admitting: Internal Medicine

## 2011-07-04 ENCOUNTER — Ambulatory Visit (INDEPENDENT_AMBULATORY_CARE_PROVIDER_SITE_OTHER): Payer: BC Managed Care – PPO | Admitting: Nurse Practitioner

## 2011-07-04 ENCOUNTER — Telehealth: Payer: Self-pay | Admitting: Internal Medicine

## 2011-07-04 ENCOUNTER — Other Ambulatory Visit: Payer: BC Managed Care – PPO

## 2011-07-04 ENCOUNTER — Encounter: Payer: Self-pay | Admitting: Nurse Practitioner

## 2011-07-04 ENCOUNTER — Ambulatory Visit (INDEPENDENT_AMBULATORY_CARE_PROVIDER_SITE_OTHER)
Admission: RE | Admit: 2011-07-04 | Discharge: 2011-07-04 | Disposition: A | Discharge status: Home / Self Care | Payer: BC Managed Care – PPO | Source: Ambulatory Visit | Attending: Nurse Practitioner | Admitting: Nurse Practitioner

## 2011-07-04 ENCOUNTER — Other Ambulatory Visit (INDEPENDENT_AMBULATORY_CARE_PROVIDER_SITE_OTHER): Payer: BC Managed Care – PPO

## 2011-07-04 ENCOUNTER — Telehealth: Payer: Self-pay | Admitting: Nurse Practitioner

## 2011-07-04 VITALS — BP 110/60 | HR 92 | Temp 98.4°F | Ht 64.0 in | Wt 136.0 lb

## 2011-07-04 DIAGNOSIS — R109 Unspecified abdominal pain: Secondary | ICD-10-CM

## 2011-07-04 DIAGNOSIS — R10A1 Flank pain, right side: Secondary | ICD-10-CM

## 2011-07-04 LAB — COMPREHENSIVE METABOLIC PANEL
Albumin: 3.8 g/dL (ref 3.5–5.2)
Alkaline Phosphatase: 86 U/L (ref 39–117)
BUN: 8 mg/dL (ref 6–23)
CO2: 26 mEq/L (ref 19–32)
Calcium: 8.2 mg/dL — ABNORMAL LOW (ref 8.4–10.5)
Chloride: 95 mEq/L — ABNORMAL LOW (ref 96–112)
Glucose, Bld: 73 mg/dL (ref 70–99)
Potassium: 3.1 mEq/L — ABNORMAL LOW (ref 3.5–5.1)
Sodium: 131 mEq/L — ABNORMAL LOW (ref 135–145)
Total Protein: 6.4 g/dL (ref 6.0–8.3)

## 2011-07-04 NOTE — Assessment & Plan Note (Addendum)
Nausea, vomiting, right flank and right back pain. I do not think this is GI in nature but will check some basic labs, obtain urinalysis and get a KUB of the abdomen. This could be a kidney stone though pain more constant than expected. Pyelonephritis is possible. Musculoskeletal pain possible but dysuria doesn't fit that picture. I will call patient with results as soon as they become available.

## 2011-07-04 NOTE — Progress Notes (Signed)
Selena Farrell [id number] 10/09/1960   HISTORY OR PRESENT ILLNESS : Patient is a 50 year old female who was hospitalized in September with upper abdominal pain, found to have a clean based antral ulcer on EGD. She was H. Pylori positive and completed treatment for that. Patient readmitted several days ago with recurrent upper abdominal pain (mainly RUQ) which was this time associated with elevated LFTs. She had a dilated CBD of unclear etiology on U/S and MRCP. For further evaluation she underwent ERCP which revealed dilated common hepatic duct and common bile duct dilation up to 11mm. No strictures, stones or other pathology seen. A sphincterotomy through a long segment of sphincter was done.  Post-ERCP patient's pain significantly improved and it has now totally resolved. Patient comes in today with a new type of pain. She has acute right flank / back pain, dysuria, nausea and vomiting. No fevers. No hematuria. She hasn't ever had this pain before. Of note, no evidence of residual / recurrent gastric ulcers on recent ERCP.  Current Medications, Allergies, Past Medical History, Past Surgical History, Family History and Social History were reviewed in [employer] electronic medical record  PHYSICAL EXAMINATION : General: Well developed  female lying on exam table moaning. Eyes:  sclerae anicteric,conjunctive pink. Ears: Normal auditory acuity Mouth: No deformity or lesions Neck: Supple, no masses.  Lungs: Clear throughout to auscultation Heart: Regular rate and rhythm; no murmurs heard Abdomen: Soft, nondistended, nontender. No masses or hepatomegaly noted. Normal bowel sounds +CVA tenderness. Rectal:not done Musculoskeletal: Symmetrical with no gross deformities  Skin: No lesions on visible extremities Extremities: No edema or deformities noted Neurological: Alert oriented x 4, grossly nonfocal Cervical Nodes:  No significant cervical adenopathy Psychological:  Alert and cooperative.  Normal mood and affect  ASSESSMENT AND PLAN :

## 2011-07-04 NOTE — Telephone Encounter (Signed)
Selena Farrell, you saw this pt on 06/14/11 for a f/u hospitalization. Pt with hx of abdominal pain seen in the ER several times since August with a couple of admissions. In September you was found to have a gastic ulcer by Dr Rhea Belton and was positive for H. Pylori. On 06/23/11, pt was again hospitalized for apin and underwent an ERCP with Sphincterotomy with dilation of both the Common Bile Duct and the Common Hepatic Duct.  Yesterday she developed terrible pain on her right side unrelieved by Tramadol, but relieved by Tylox. She has had BM's that are loose, semi formed. She did call Dr Juanda Chance yesterday. Pt given an appt with Selena Farrell at 1:30pm today

## 2011-07-04 NOTE — Patient Instructions (Signed)
Go to the X-ray department, basement level.  Please go to the basement level to have your labs drawn.   We will call you with the results of the labs and X-ray.

## 2011-07-05 LAB — LIPASE: Lipase: 28 U/L (ref 11.0–59.0)

## 2011-07-05 LAB — URINALYSIS
Ketones, ur: NEGATIVE
Specific Gravity, Urine: 1.01 (ref 1.000–1.030)
Total Protein, Urine: NEGATIVE
Urine Glucose: NEGATIVE
Urobilinogen, UA: 0.2 (ref 0.0–1.0)
pH: 8 (ref 5.0–8.0)

## 2011-07-05 NOTE — Telephone Encounter (Signed)
Spoke with patient and she is feeling some better today. She is wondering if gas build up was causing her the pain. She had been taking Gas ex daily. She is also wanting the results of her xray. Please, advise.

## 2011-07-06 ENCOUNTER — Telehealth: Payer: Self-pay | Admitting: *Deleted

## 2011-07-06 DIAGNOSIS — E876 Hypokalemia: Secondary | ICD-10-CM

## 2011-07-06 NOTE — Telephone Encounter (Signed)
See phone note on 07/06/11

## 2011-07-06 NOTE — Telephone Encounter (Signed)
Spoke with patient. She is not taking any diuretics. She is having diarrhea 3-4/day. States she is having the "force out" liquid stool and it makes her back hurt. She is having flank pain. She will come tomorrow for repeat labs.

## 2011-07-06 NOTE — Telephone Encounter (Signed)
Message copied by Daphine Deutscher on Wed Jul 06, 2011 10:28 AM ------      Message from: Meredith Pel      Created: Wed Jul 06, 2011 10:22 AM       [name], urine and labs look okay except low sodium and potassium, ??reason.  Has she taken any diuretics?  Is she having diarrhea? We need to recheck BMET tomorrow.  Also, how is the flank pain?

## 2011-07-06 NOTE — Progress Notes (Signed)
Agree with initial assessment and plan. Selena Farrell. to followup on ordered studies and respond to their results accordingly

## 2011-07-07 ENCOUNTER — Ambulatory Visit (INDEPENDENT_AMBULATORY_CARE_PROVIDER_SITE_OTHER): Payer: BC Managed Care – PPO | Admitting: Internal Medicine

## 2011-07-07 ENCOUNTER — Encounter: Payer: Self-pay | Admitting: Internal Medicine

## 2011-07-07 ENCOUNTER — Other Ambulatory Visit (INDEPENDENT_AMBULATORY_CARE_PROVIDER_SITE_OTHER): Payer: BC Managed Care – PPO

## 2011-07-07 DIAGNOSIS — K834 Spasm of sphincter of Oddi: Secondary | ICD-10-CM | POA: Insufficient documentation

## 2011-07-07 DIAGNOSIS — IMO0001 Reserved for inherently not codable concepts without codable children: Secondary | ICD-10-CM

## 2011-07-07 DIAGNOSIS — M62838 Other muscle spasm: Secondary | ICD-10-CM

## 2011-07-07 DIAGNOSIS — E876 Hypokalemia: Secondary | ICD-10-CM

## 2011-07-07 DIAGNOSIS — M791 Myalgia, unspecified site: Secondary | ICD-10-CM

## 2011-07-07 LAB — BASIC METABOLIC PANEL
CO2: 27 mEq/L (ref 19–32)
Calcium: 9.2 mg/dL (ref 8.4–10.5)
Creatinine, Ser: 0.8 mg/dL (ref 0.4–1.2)
GFR: 82.66 mL/min (ref >60.00)
Sodium: 134 mEq/L — ABNORMAL LOW (ref 135–145)

## 2011-07-07 MED ORDER — TRAMADOL HCL 50 MG PO TABS: 100.0000 mg | ORAL_TABLET | Freq: Four times a day (QID) | ORAL | Status: DC | PRN

## 2011-07-07 MED ORDER — TRAZODONE HCL 50 MG PO TABS: 50.0000 mg | ORAL_TABLET | Freq: Every day | ORAL | Status: DC

## 2011-07-07 MED ORDER — CYCLOBENZAPRINE HCL 10 MG PO TABS: 10.0000 mg | ORAL_TABLET | Freq: Three times a day (TID) | ORAL | Status: AC | PRN

## 2011-07-07 NOTE — Assessment & Plan Note (Signed)
Type 1 - CBD dilation, LFT abnormalities, and pain. Sphincterotomy October 2012

## 2011-07-07 NOTE — Progress Notes (Signed)
Subjective:    Patient ID: Selena Farrell, female    DOB: 26-May-1961, 50 y.o.   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 Of note the patient was hospitalized earlier in October 4 epigastric and right upper quadrant abdominal pain associated with LFTs and abnormal MRCP demonstrating CBD dilation. During this hospitalization she underwent ERCP with sphincterotomy which revealed a dilated bile duct without other abnormality. The patient's LFTs and pain improved after sphincterotomy.  Also at endoscopy, her previous gastric ulcers were documented to have healed.    Recently, 4 days ago the patient awoke with right flank pain. She was seen by one of our extenders earlier in the week and underwent abdominal x-ray which was unremarkable as well as labs plus urinalysis. Urinalysis was negative for signs of infection. The labs were unrevealing other than a mildly low sodium and potassium.  She returns today stating that her flank pain is persistent. This pain is constant and seems to be worse with twisting or moving. It also is worse when she sits down to urinate. A heating pad as may get some better. It is not associated with eating. She's not having trouble with nausea or vomiting. No heartburn. She reports the pain is "different" than her previous epigastric and right upper quadrant pain. She denies trauma or known inciting event. She is having 3-4 loose stools a day, which has been her baseline of late. She's had no blood in her stool or melena. She reports her appetite remains okay. She has taken tramadol for the flank pain and has helped some. She's been using 100 mg of tramadol and is almost out of this medication.  She reports ongoing trouble with sleep though she would like to wean herself off of the temazepam. She is somewhat concerned about its  addictive properties. She's been without this medication for several nights, and recently tried Tylenol PM without benefit.  Review of Systems Constitutional: Negative for fever, chills, night sweats, activity change, appetite change and unexpected weight change HEENT: Negative for sore throat, mouth sores and trouble swallowing. Eyes: Negative for visual disturbance Respiratory: Negative for cough, chest tightness and shortness of breath Cardiovascular: Negative for chest pain, palpitations and lower extremity swelling Gastrointestinal: See history of present illness Genitourinary: Negative for dysuria and hematuria.  Musculoskeletal: see HPI. Skin: Negative for rash or color change Neurological: Negative for headaches, weakness, numbness Hematological: Negative for adenopathy, negative for easy bruising/bleeding Psychiatric/behavioral: Positive for ongoing depressed mood and anxiety. Trouble sleeping  Patient Active Problem List  Diagnoses  . Epigastric pain  . Tobacco abuse  . Helicobacter positive gastritis  . PUD (peptic ulcer disease)  . 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 cyclobenzaprine (FLEXERIL) 10 MG tablet Take 1 tablet (10 mg total) by mouth 3 (three) times daily as needed for muscle spasms.  30 tablet  1  . folic acid (FOLVITE) 1 MG tablet Take 1 mg by mouth daily.        Marland Kitchen LORazepam (ATIVAN) 0.5 MG tablet Take 0.5 mg by mouth every 6 (six) hours as needed.        . nicotine (NICODERM CQ - DOSED IN MG/24 HOURS) 21 mg/24hr patch Place 1 patch onto the skin daily.        Marland Kitchen  pantoprazole (PROTONIX) 40 MG tablet Take 40 mg by mouth daily.        . polyethylene glycol (MIRALAX / GLYCOLAX) packet Take 17 g by mouth daily.        . temazepam (RESTORIL) 15 MG capsule Take 15 mg by mouth at bedtime as needed.        . traMADol (ULTRAM) 50 MG tablet Take 2 tablets (100 mg total) by mouth every  6 (six) hours as needed.  90 tablet  1  . traZODone (DESYREL) 50 MG tablet Take 1 tablet (50 mg total) by mouth at bedtime.  30 tablet  1   Allergies  Allergen Reactions  . Aspirin   . Vicodin (Hydrocodone-Acetaminophen)     Social History  . Marital Status: Widowed    Number of Children: 2   Occupational History  .  [employer]   Social History Main Topics  . Smoking status: Current Everyday Smoker  . Smokeless tobacco: Never Used   Comment: Counseling to quit smoking given to patient in exam room   . Alcohol Use: No     Heavy drinker quit  Sept 2012   . Drug Use: No    Family History  Problem Relation Age of Onset  . Colon cancer Neg Hx       Objective:   Physical Exam BP 132/80  Pulse 72  Wt 133 lb 6.4 oz (60.51 kg) Constitutional: Well-developed and well-nourished. No distress. HEENT: Normocephalic and atraumatic. Oropharynx is clear and moist. No oropharyngeal exudate. Conjunctivae are normal. Pupils are equal round and reactive to light. No scleral icterus. Neck: Neck supple. Trachea midline. Cardiovascular: Normal rate, regular rhythm and intact distal pulses. No M/R/G Pulmonary/chest: Effort normal and breath sounds normal. No wheezing, rales or rhonchi. Abdominal: Soft, nontender, nondistended. Bowel sounds active throughout. There are no masses palpable. No hepatosplenomegaly. Extremities: no clubbing, cyanosis, or edema Musculoskeletal: Patient has some tenderness to palpation over the right flank near the 11th and 12th rib. Mild CVA tenderness. Pain worse with movement.  No bruising or rashes noted in this area Lymphadenopathy: No cervical adenopathy noted. Neurological: Alert and oriented to person place and time. Skin: Skin is warm and dry. No rashes noted. Psychiatric: Normal mood and affect. Behavior is normal.  CMP     Component Value Date/Time   NA 134* 07/07/2011 1121   K 4.4 07/07/2011 1121   CL 99 07/07/2011 1121   CO2 27  07/07/2011 1121   GLUCOSE 129* 07/07/2011 1121   BUN 6 07/07/2011 1121   CREATININE 0.8 07/07/2011 1121   CALCIUM 9.2 07/07/2011 1121   PROT 6.4 07/04/2011 1516   ALBUMIN 3.8 07/04/2011 1516   AST 17 07/04/2011 1516   ALT 15 07/04/2011 1516   ALKPHOS 86 07/04/2011 1516   BILITOT 0.3 07/04/2011 1516   GFRNONAA >90 06/22/2011 0625   GFRAA >90 06/22/2011 0625   CBC    Component Value Date/Time   WBC 9.7 06/22/2011 0625   RBC 4.19 06/22/2011 0625   HGB 12.6 06/22/2011 0625   HCT 37.6 06/22/2011 0625   PLT 309 06/22/2011 0625   MCV 89.7 06/22/2011 0625   MCH 30.1 06/22/2011 0625   MCHC 33.5 06/22/2011 0625   RDW 13.4 06/22/2011 0625   LYMPHSABS 1.3 06/14/2011 1601   MONOABS 0.9 06/14/2011 1601   EOSABS 0.0 06/14/2011 1601   BASOSABS 0.0 06/14/2011 1601      Assessment & Plan:  50 yo female with PMH of peptic ulcer  disease and 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 Right flank pain -- the patient's pain is most consistent with musculoskeletal injury. Her labs were reassuring and her urinalysis was negative for infection. Also there is no evidence of hematuria which might suggest renal stone. The pain is inconsistent with renal colic given it's more constant nature and the fact that it worsens with movement and palpation. I also have reviewed her abdominal imaging/MRI from earlier in the month and her kidneys at that time were normal with no evidence of stones.  For now we'll treat this as muscular skeletal pain with muscle relaxer, cyclobenzaprine 10 mg 3 times a day when necessary. Also refill tramadol 100 mg every 6 hours when necessary pain.  I've asked that she followup for this problem with her primary care physician, Dr. Selena Batten  2. Sleep -- I think that she continues to struggle with insomnia but also with likely anxiety with depression. I understand her desire to wean off temazepam given it's addictive nature.  She is out of this medication for now and I will not refill it. I will give her trazodone 50 mg each bedtime when necessary for sleep. She can discuss this further with Dr. Selena Batten and decide if this is the best medication for her long-term for insomnia.  3. PUD -- her peptic ulcers have been documented to have healed and she was treated for H. pylori. I have recommended that she continue to avoid NSAIDs  4. 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 This seems to have resolved with sphincterotomy and at this point nothing further is necessary.  Followup when necessary

## 2011-07-07 NOTE — Discharge Summary (Signed)
Selena Farrell, Selena Farrell               ACCOUNT NO.:  [id number]  MEDICAL RECORD NO.:  [id number]  LOCATION:  5527                         FACILITY:  [facility]  PHYSICIAN:  Erick Blinks, MD         DATE OF BIRTH:  1961-01-16  DATE OF ADMISSION:  06/21/2011 DATE OF DISCHARGE:  06/26/2011                              DISCHARGE SUMMARY   ADMITTING DIAGNOSES: 1. Nausea, vomiting and abdominal pain since August 2012. 2. Antral gastric ulcer on endoscopy on May 22, 2011.  Completed     treatment for positive Helicobacter in mid September 2012.     Symptoms persistent despite Helicobacter treatment and ongoing     proton pump inhibitor therapy.  Now with enlarged bile duct on     ultrasound and rising LFTs.  Rule out acalculous cholecystitis,     rule out ampullary stenosis, rule out sphincter of Oddi     dysfunction, rule out occult biliary/liver/pancreatic neoplasia. 3. History of alcohol abuse.  The patient has been sober for over 4     weeks. 4. Depression and anxiety. 5. Constipation likely owing to lack of p.o. intake as well as p.r.n.     use of narcotics. 6. Status post cervical spine diskectomy in February 2005. 7. Questionable history of Barrett esophagus.  None of the biopsies     performed on her esophagus has ever confirmed any metaplasia or     dysplasia. 8. Status post Cesarean section. 9. Status post appendectomy. 10.Hypertension. 11.Gastroesophageal reflux disease. 12.Allergies to ASPIRIN, CODEINE, CELEBREX, PREDNISONE.  Reactions to     these medications unknown. 13.Status post normal colonoscopy in August 1995. 14.Ongoing tobacco abuse.  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  Status post endoscopic retrograde cholangiopancreatography with sphincterotomy on June 23, 2011.  PROCEDURES:  Endoscopic retrograde cholangiopancreatography with sphincterotomy by Dr. Claudette Head.  Performed on June 23, 2011. Findings:   An 11-mm common bile duct and dilated common hepatic duct. Previously noted antral ulcer healed.  Stone retrieval balloon passed several times, but no apparent stones nor sludge removed.  Intra and extrahepatic bile ducts, otherwise normal, ampulla normal, no capsulitis or ampullary trauma noted.  A sphincterotomy was performed and very good drainage noted following sphincterotomy.  Intentionally, the pancreatic duct was neither cannulated nor filled.  CONSULTATIONS:  None.  BRIEF HISTORY:  Mrs. Larrivee is a 50 year old white female who started having epigastric pain as well as nausea, vomiting back in early to mid August 2012.  On May 22, 2011, an endoscopy revealed gastritis, duodenitis, and an antral ulcer.  Biopsies were positive for H. pylori and as an outpatient, she completed antibiotic treatment by June 07, 2011.  She had been on chronic PPI therapy following the endoscopy, which was performed when she was an inpatient.  She has had only brief reprieve from her GI symptoms.  She has gone back to see [name], nurse practitioner at the GI Office and further testing was performed including LFTs, which showed mild abnormality of alkaline phosphatase as well as transaminases.  During hospitalization in September, these labs have been normal.  An ultrasound performed during hospitalization on May 21, 2011, showed a 12-mm common bile duct and normal-looking gallbladder.  The patient was to have an outpatient MRCP on June 23, 2011.  However, she was having ongoing pain despite the use of narcotics and she was throwing up at times.  She was admitted to the hospital for management to GI Service covered by Dr. Erick Blinks.  LABORATORY STUDIES:  White blood cell count 11.1, decreased to 9.7 on recheck.  Hemoglobin 13.4, hematocrit 38.3, MCV 88, platelets 330.  PT 12.8, INR 0.9.  Sodium 138, potassium 4.1.  Chloride 101, CO2 27. Glucose 96.  BUN 5, creatinine 0.7.   Total bilirubin 0.3.  Alkaline phosphatase initially 263, on recheck 232.  ALT went from 40 down to 35. AST went from 32 to 31.  Albumin 3.0.  Lipase 19.  Hepatitis B core antibody negative.  Hepatitis B surface antibody negative.  Hepatitis A total antibody negative.  Hepatitis A IgM negative.  Hepatitis B surface antigen negative.  MRCP of June 22, 2011, showed mild intra and extrahepatic biliary duct dilatation.  There was abrupt tapering of the common duct at the level of the pancreatic head.  No pancreatic ductal dilatation, no pancreatic head mass.  HOSPITAL COURSE:  The patient was admitted in the evening of June 21, 2011.  She was started on IV fluids, p.r.n. Zofran and p.r.n. Dilaudid. Her oral outpatient medications were continued.  MRCP demonstrated dilated common bile duct.  She underwent ERCP with sphincterotomy as described above by Dr. Russella Dar. Following the sphincterotomy and ERCP, the patient had noticeable improvement in the level of nausea, vomiting, and abdominal pain.  Her LFTs were trending downward.  She was discharged to home in stable condition.  MEDICATIONS AT DISCHARGE: 1. Temazepam 15 mg one to two p.o. at bedtime p.r.n. sleep. 2. Citalopram 20 mg one p.o. daily. 3. Zofran 8 mg one p.o. b.i.d. for nausea. 4. Ativan 0.5 mg one p.o. q.6 h. p.r.n. anxiety. 5. Folic acid 1 mg daily. 6. Thiamin 100 mg daily. 7. Atenolol 25 mg one daily at bedtime. 8. Protonix 40 mg one p.o. daily. 9. Ambien 10 mg two p.o. at bedtime p.r.n. sleep.  This alternates     with temazepam. 10.Tramadol 50 mg one p.o. q.8 h. p.r.n. pain. 11.MiraLax 17 g p.o. daily for constipation.  Office follow up with Dr. Rhea Belton.  The patient will be called with a scheduled appointment by the GI Office.  Note that during this hospitalization, the patient underwent pneumococcal vaccine along with influenza vaccine on June 23, 2011.  DIET AT DISCHARGE:  Heart healthy.  ACTIVITY AT  DISCHARGE:  Increase activity slowly.     Jennye Moccasin, PA-C   ______________________________ Erick Blinks, MD    SG/MEDQ  D:  06/30/2011  T:  07/01/2011  Job:  [id number]  cc:   Massie Maroon, MD  Electronically Signed by Jennye Moccasin PA-C on 07/06/2011 03:57:48 PM Electronically Signed by Erick Blinks MD on 07/07/2011 11:23:02 AM

## 2011-07-07 NOTE — H&P (Signed)
Selena Farrell, Selena Farrell               ACCOUNT NO.:  [id number]  MEDICAL RECORD NO.:  [id number]  LOCATION:  5527                         FACILITY:  [facility]  PHYSICIAN:  Erick Blinks, MD         DATE OF BIRTH:  Jul 10, 1961  DATE OF ADMISSION:  06/21/2011 DATE OF DISCHARGE:                             HISTORY & PHYSICAL   PRIMARY CARE DOCTOR:  Massie Maroon, MD  REASON FOR ADMISSION:  Ongoing abdominal pain with nausea and vomiting.  HISTORY OF PRESENT ILLNESS:  Selena Farrell is a pleasant 50 year old white female whose GI symptoms date back to at least early to mid August 2012. She has been complaining of abdominal pain which is primarily epigastric but when it gets severe it tends to radiate throughout the abdomen.  She throws up non-coffee ground material, and she does not throw up solid food.  She was hospitalized briefly in early September for the symptoms. She underwent upper endoscopy on May 22, 2011.  She had gastritis, duodenitis, and an antral ulcer on that study.  The biopsies proved positive for H. pylori, and as an outpatient she completed antibiotic treatment on June 07, 2011, to eradicate the H. pylori.  She has had brief reprieves from the GI symptoms towards the end of her hospitalization in September and then again for 5 days towards the end and after completing treatment with the antibiotics.  Notably during her hospitalization in September, she underwent an ultrasound of the abdomen on May 21, 2011.  This showed a stoneless, 12-mm common bile duct. The gallbladder was visualized and was unremarkable.  Her LFTs, lipase, and amylase in September were normal.  On June 14, 2011, the patient was seen at the GI office by Tiburcio Pea, nurse-practitioner.  She was complaining of her GI symptoms and labs were obtained which showed a mildly abnormal alkaline phosphatase, AST and ALT but the bilirubin was normal as were the lipase and amylase. She was set up to  undergo an outpatient MRCP on June 23, 2011.  She was provided with a prescription for Vicodin.  The Vicodin controlled the pain but did not control it for very long, so yesterday after calling the office complaining of pain and ongoing nausea as well as constipation related to the narcotics and perhaps related to her lack of p.o. intake, she received a prescription for Tylenol No. 3, which she says does work more effectively for longer periods of time to control the pain.  She is still nauseated.  She is taking her Protonix twice daily.  Because of ongoing symptoms and the fact that she was really having trouble making it to the MRCP, arrangements were made to admit her to the hospital this afternoon.  She also had a repeat set of LFTs on June 20, 2011, which when compared with June 14, 2011, showed a rising alkaline phosphatase at 231 compared to 125.  AST was actually down from 72 on June 14, 2011, to 50 on June 20, 2011.  The ALT had gone from 80 down to 46.  The bilirubin has been normal both in October as well as when she was hospitalized in September.  On June 14, 2011, she had a white count of 14.7.  The patient describes some nocturnal chills and sweats.  The patient's p.o. intake has been limited to clear liquids since last week.  She is taking these in small to moderate amounts without vomiting them up, however, when she takes a lot of volume and she feels like she is going to vomit.  She did last vomit yesterday although nausea is ongoing through today.  The patient has been using enemas as well as MiraLax to try to induce a bowel movement but she only had the results of a small amount of stool from an enema a couple of days ago.  CURRENT MEDICATIONS: 1. Tylenol No. 3 one p.o. q.4 hours. 2. Protonix 40 mg twice daily. 3. Celexa 20 mg once daily. 4. Thiamin 100 mg daily. 5. Folic acid 1 mg daily. 6. Atenolol 25 mg daily. 7. Ativan 0.5 mg 1 p.o. q.6 h. p.r.n.   She rarely uses this. 8. Temazepam 15 mg 1-2 tablets p.o. at bedtime for sleep. 9. MiraLax 17 g once to twice daily p.r.n. constipation. 10.Fleet type enema p.r.n.  ALLERGIES:  Listed are ASPIRIN, CODEINE, CELEBREX, PREDNISONE, however, no reactions are listed.  FAMILY HISTORY:  Her father was an alcoholic.  He died when the patient was 14 from consequences of hypertension.  There is no family history of colon cancer.  Her mother suffered a stroke at 60 and died at 6.  She has a sister who suffered a brain injury and aneurysm.  REVIEW OF SYSTEMS:  For comparison, her weight on June 14, 2011, was 131 pounds.  She describes chills, nocturnal sweats, and generally feeling achy.  She denies musculoskeletal review, however, is negative for joint pain or chronic arthritis.  NEUROLOGIC:  No chronic headaches. No seizures.  PSYCHIATRIC:  She has been having insomnia mostly because of pain.  ENDOCRINE:  No history of diabetes.  HEMATOLOGIC:  No history of transfusions.  ALLERGIES AND IMMUNIZATION STATUS:  She has not had a 2012 flu shot, but she has taken flu shots without adverse affects in the past.  GENITOURINARY:  The patient does not endorse oliguria or dysuria or hematuria.  GI:  No dysphagia.  She completed antibiotic H. pylori regimen treatment on June 07, 2011.  RESPIRATORY:  If the pain is severe, she does get a bit short of breath, but generally does not have a cough or dyspnea on exertion.  CARDIOVASCULAR:  No palpitations, no extremity edema.  ENT:  No oral ulcers, no dental problems.  OCULAR:  The patient does wear glasses for reading.  SOCIAL HISTORY:  The patient is a widow.  She has 2 children.  She does have a committed boyfriend, but she does not live with him.  She smokes about a pack per day.  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  The patient says her last alcohol was about 2 days prior to her early September  admission.  PRIOR ENDOSCOPIC STUDIES: 1. Colonoscopy was normal in 1995, performed by Dr. Virginia Rochester. 2. Endoscopy in 2008, by Dr. Virginia Rochester raised the question of Barrett's but     the biopsy of the esophagus was negative for Barrett's.  She had     some bulbar erythema. 3. EGD in 2009, showed grossly small islands of Barrett's but again     the biopsy of this area was negative for Barrett's, also showed a     hiatal hernia. 4. The latest  endoscopy described above on May 22, 2011.  LABORATORIES:  The latest from June 20, 2011, with bilirubin 0.3, alkaline phosphatase 231, AST 50, ALT 46.  H. pylori serum testing was negative on June 17, 2011.  Lipase was 20 and amylase were 25 on June 14, 2011.  X-RAYS AND IMAGING:  None since May 21, 2011.  Abdominal ultrasound showing 12-mm common bile duct.  No stones.  Pancreas obscured. Gallbladder unremarkable.  PHYSICAL EXAMINATION:  VITAL SIGNS:  Not yet been obtained. GENERAL:  The patient is not diaphoretic.  She looks moderately ill. OCULAR:  No icterus, no pallor.  Extraocular movements are intact. ENT:  The oral mucosa is moist and clear. NECK:  No masses.  No JVD. LUNGS:  Clear to auscultation and percussion bilaterally. CARDIAC:  There is a regular rate and rhythm, S1, S2 audible and no murmurs, rubs or gallops. ABDOMEN:  Soft and nondistended.  There is tenderness without guarding or rebound on all quadrants though much less so in the right lower quadrant. RECTAL:  Was not performed. EXTREMITIES:  There is no pedal edema. NEUROLOGIC:  The patient has no tremor.  She moves all 4.  She is alert and oriented x3.  IMPRESSION: 1. Chronic nausea and vomiting with abdominal pain since August 2012.     May 22, 2011, upper endoscopy revealed reflux disease as well     as an antral gastric ulcer.  She was treated for Helicobacter     pylori in mid September.  She has only had brief reprieves from her     gastrointestinal  symptoms since August.  Ultrasound is showing an     enlarged bile duct and currently her alkaline phosphatase is rising     and she has minor elevation of her transaminases but normal     bilirubin.  Question if her symptoms are biliary in etiology     perhaps acalculous cholecystitis.  She does have an elevated white     count on June 14, 2011. 2. Constipation.  This may be secondary to narcotics as well as lack     of solid food intake. 3. History of alcohol abuse.  The patient has been sober for over 4     weeks. 4. Depression and anxiety.  These are stable although she is obviously     a bit more depressed because of her physical symptoms.  PLAN:  The patient being admitted for IV fluids, diet will be limited to clear liquids.  We will plan to repeat labs.  Plan for MRCP tomorrow but tonight we will get plain abdominal films to assess bowel gas pattern.     Jennye Moccasin, PA-C   ______________________________ Erick Blinks, MD    SG/MEDQ  D:  06/21/2011  T:  06/22/2011  Job:  [id number]  Electronically Signed by Jennye Moccasin PA-C on 06/30/2011 02:37:16 PM Electronically Signed by Erick Blinks MD on 07/07/2011 11:23:00 AM

## 2011-07-07 NOTE — Patient Instructions (Signed)
We have sent the following medications to your pharmacy for you to pick up at your convenience:  Dr. Rhea Belton would like you to follow up with your Primary care physician  cc: Floy Sabina.D.

## 2011-07-08 ENCOUNTER — Telehealth: Payer: Self-pay | Admitting: *Deleted

## 2011-07-08 NOTE — Telephone Encounter (Signed)
Notified pt she may pick up her LOA papers. Pt stated understanding and said to inform Dr Rhea Belton the medicine helped the muscle in her back.

## 2011-07-12 ENCOUNTER — Telehealth: Payer: Self-pay | Admitting: *Deleted

## 2011-07-12 NOTE — Telephone Encounter (Signed)
Left a message for patient and her voice mail that electrolytes have improved as per [name], NP

## 2011-07-12 NOTE — Telephone Encounter (Signed)
Message copied by Daphine Deutscher on Tue Jul 12, 2011 11:27 AM ------      Message from: Meredith Pel      Created: Tue Jul 12, 2011 11:22 AM       [name], please let her know that electrolytes improved. Thanks

## 2011-07-22 ENCOUNTER — Telehealth: Payer: Self-pay | Admitting: Internal Medicine

## 2011-07-22 NOTE — Telephone Encounter (Signed)
Faxed a Return To Work note to Lupita Leash at [phone]; rtw effective 07/11/2011.

## 2011-07-25 ENCOUNTER — Other Ambulatory Visit: Payer: Self-pay | Admitting: Internal Medicine

## 2011-08-12 ENCOUNTER — Other Ambulatory Visit: Payer: Self-pay | Admitting: Internal Medicine

## 2011-08-14 ENCOUNTER — Other Ambulatory Visit: Payer: Self-pay | Admitting: Internal Medicine

## 2011-08-15 NOTE — Telephone Encounter (Signed)
Dr Rhea Belton should the pt call Dr Selena Batten about this med per your last office note the pt should speak to Dr Selena Batten about long term use?

## 2011-09-07 ENCOUNTER — Telehealth: Payer: Self-pay | Admitting: Internal Medicine

## 2011-09-07 NOTE — Telephone Encounter (Signed)
Pt called to request Dr Rhea Belton write a new script for Trazadone to increase the dose to 50mg  , 2 tabs qhs. Pt stated 1 tablet simply doesn't help and she has had to increase nightly which is causing her to be almost out of the med. Please advise. Thanks.

## 2011-09-08 MED ORDER — TRAZODONE HCL 50 MG PO TABS: ORAL_TABLET | ORAL | Status: DC

## 2011-09-08 NOTE — Telephone Encounter (Signed)
Notified pt I ordered her med, but she needs to f/u with her PCP for chronic sleep problems per Dr Rhea Belton; pt stated understanding.

## 2011-09-08 NOTE — Telephone Encounter (Signed)
Yes, but insomnia is likely best managed chronically by her PCP.  Can increase to trazodone 100 mg qhsprn sleep.

## 2011-09-08 NOTE — Telephone Encounter (Signed)
Okay to increase to 50 mg qhs prn sleep.

## 2011-09-08 NOTE — Telephone Encounter (Signed)
OK to increase from 50mg  TO 100mg  qhs prn sleep? Thanks.

## 2011-09-23 ENCOUNTER — Other Ambulatory Visit: Payer: Self-pay | Admitting: Internal Medicine

## 2011-09-25 ENCOUNTER — Other Ambulatory Visit: Payer: Self-pay | Admitting: Internal Medicine

## 2011-10-21 ENCOUNTER — Other Ambulatory Visit: Payer: Self-pay | Admitting: Internal Medicine

## 2011-11-02 ENCOUNTER — Other Ambulatory Visit: Payer: Self-pay | Admitting: Internal Medicine

## 2011-11-13 ENCOUNTER — Other Ambulatory Visit: Payer: Self-pay | Admitting: Internal Medicine

## 2011-11-18 IMAGING — CR DG ABDOMEN 2V
2 series · 2 of 2 positions shown · non-contrast
Comparison: Acute abdominal series 06/21/2011.

CLINICAL DATA: Severe acute right-sided abdominal pain with nausea,
vomiting and diarrhea.

ABDOMEN - 2 VIEW

[view not recorded (1 of 2)]
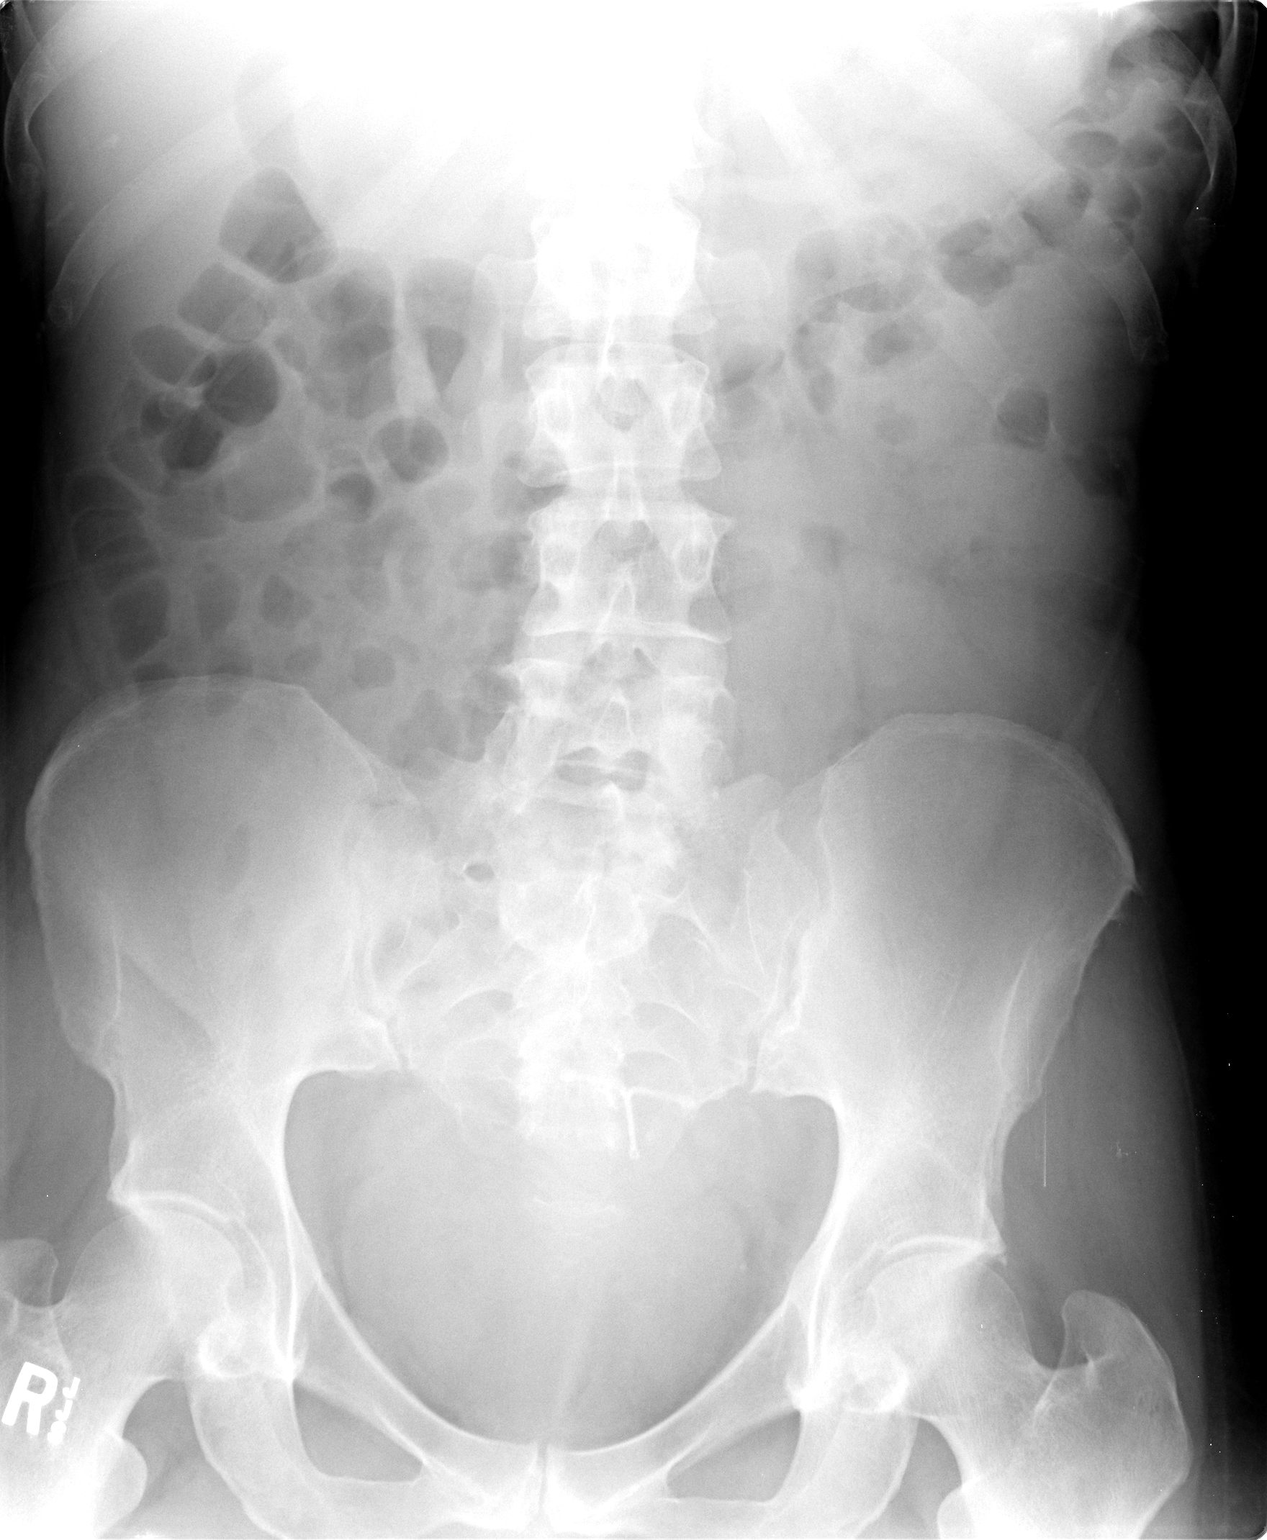

[view not recorded (2 of 2)]
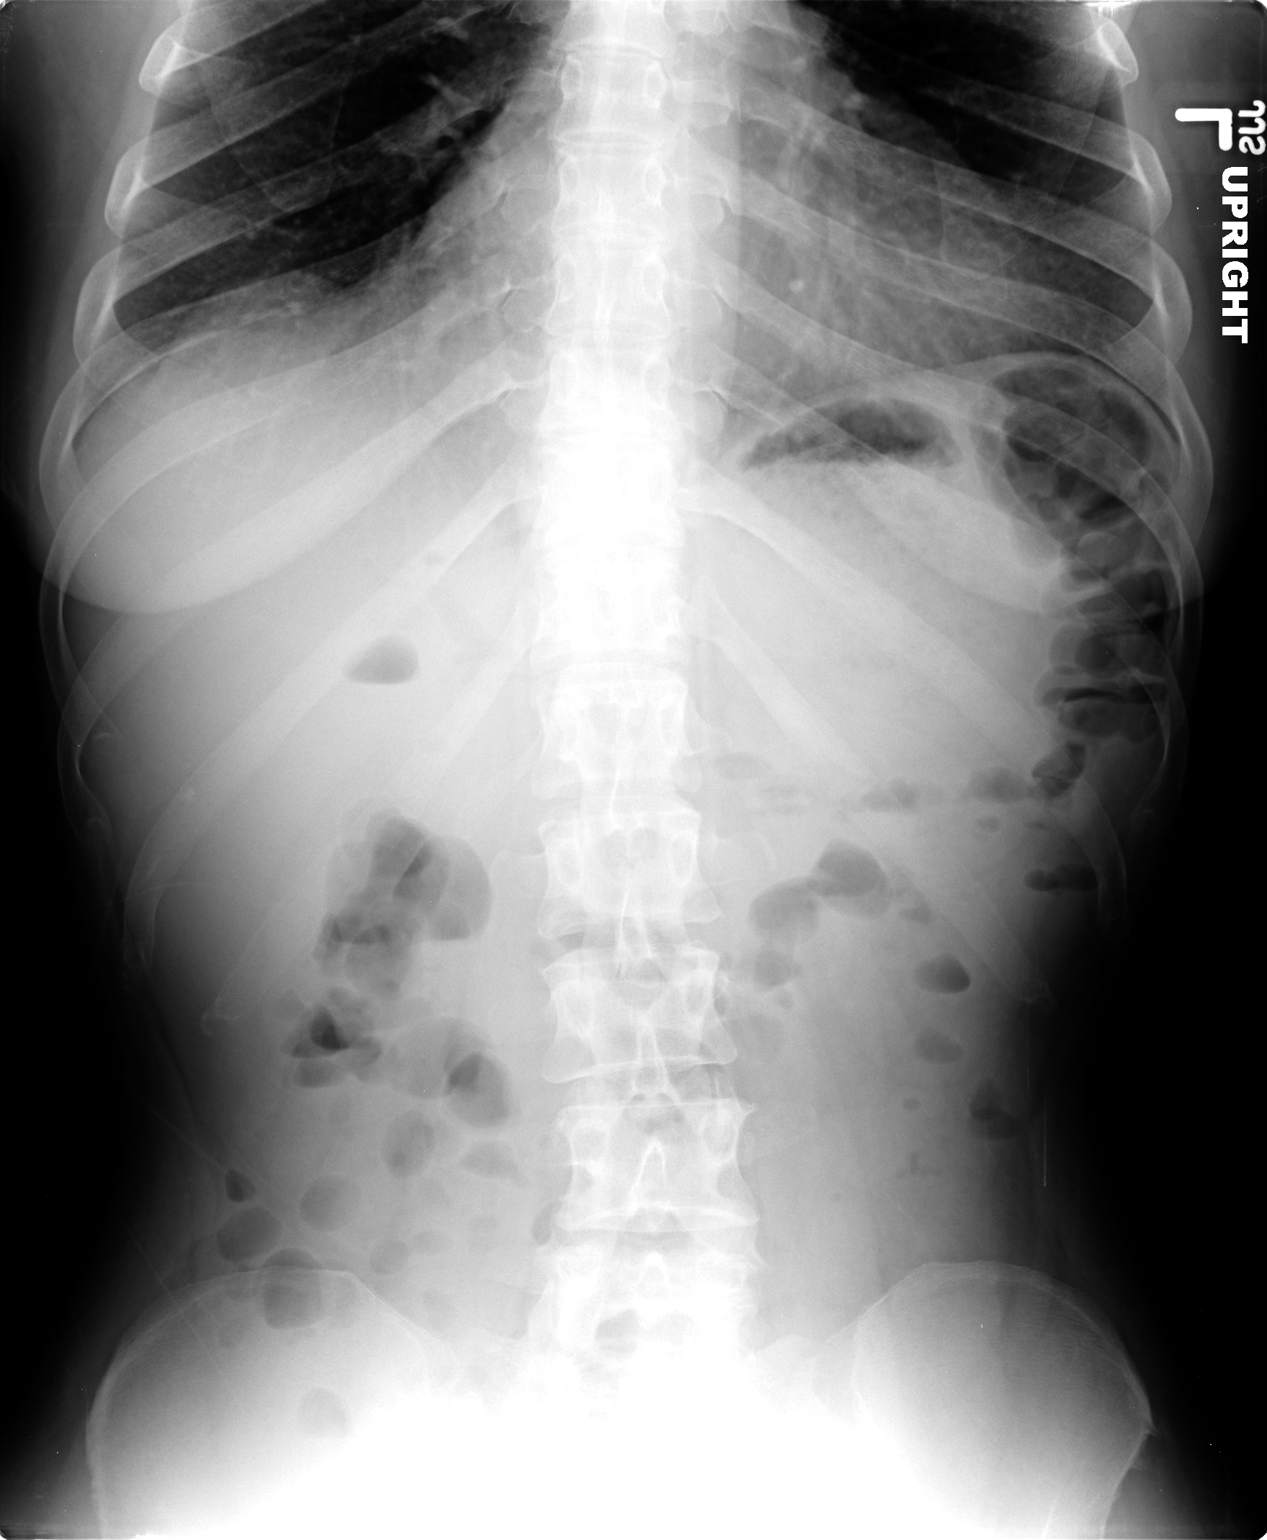

[2 of 2 positions shown; findings below may reference images not displayed]

FINDINGS: The bowel gas pattern is normal.  There is no free
intraperitoneal air or suspicious abdominal calcification.
Intrauterine device is noted.  A mild scoliosis may be positional.
There are no acute osseous findings.
IMPRESSION: No acute abdominal findings demonstrated.

## 2011-12-18 ENCOUNTER — Other Ambulatory Visit: Payer: Self-pay | Admitting: Internal Medicine

## 2011-12-27 ENCOUNTER — Other Ambulatory Visit: Payer: Self-pay | Admitting: Internal Medicine

## 2011-12-28 ENCOUNTER — Telehealth: Payer: Self-pay | Admitting: Internal Medicine

## 2011-12-28 NOTE — Telephone Encounter (Signed)
Pt reports a "popping" in upper r breast area where " her bile duct surgery was". She reports this "builds up" and she catches her breath, it pops and then she gets relief until it happens again. This can occur when drinking, eating or when she's idle doing nothing. The "popping" has gotten worse since she had a GI bug on Friday. Please advise. Thanks.

## 2011-12-29 ENCOUNTER — Telehealth: Payer: Self-pay | Admitting: *Deleted

## 2011-12-29 NOTE — Telephone Encounter (Signed)
Uncertain what this is. May be a post-infectious irritable bowel issue, if she is recovering from a GI bug. Would rec that she continue with pantoprazole daily and can use tramadol which we recently renewed if needed. Also, can try simethicone 160 mg with meals to try and relief potential gas pressure

## 2011-12-29 NOTE — Telephone Encounter (Signed)
Her primary care doctor needs to handle her sleep issues. This is beyond the scope of her GI care

## 2011-12-29 NOTE — Telephone Encounter (Signed)
Informed pt of Dr Lauro Franklin advice to try Simethicone with meals; call for worsening. Pt stated understanding.

## 2011-12-29 NOTE — Telephone Encounter (Signed)
Pt called back to report she hasn't slept in 2 nights; wants to know if you will order anything. She has Ativan and Restoril listed and reports she tried them and they didn't work. Please advise. Thanks.

## 2011-12-30 NOTE — Telephone Encounter (Signed)
lmom for pt to call back  If she has questions, but Dr Rhea Belton requests she call her PCP for sleep issues.

## 2012-01-22 ENCOUNTER — Other Ambulatory Visit: Payer: Self-pay | Admitting: Internal Medicine

## 2012-02-12 ENCOUNTER — Other Ambulatory Visit: Payer: Self-pay | Admitting: Internal Medicine

## 2012-02-24 ENCOUNTER — Other Ambulatory Visit: Payer: Self-pay | Admitting: Internal Medicine

## 2012-03-20 ENCOUNTER — Other Ambulatory Visit: Payer: Self-pay | Admitting: Internal Medicine

## 2012-03-21 ENCOUNTER — Other Ambulatory Visit: Payer: Self-pay | Admitting: Internal Medicine

## 2012-03-27 ENCOUNTER — Other Ambulatory Visit: Payer: Self-pay | Admitting: Gastroenterology

## 2012-03-27 MED ORDER — TRAMADOL HCL 50 MG PO TABS: 50.0000 mg | ORAL_TABLET | Freq: Four times a day (QID) | ORAL | Status: DC | PRN

## 2012-03-31 ENCOUNTER — Telehealth: Payer: Self-pay | Admitting: Internal Medicine

## 2012-03-31 ENCOUNTER — Other Ambulatory Visit: Payer: Self-pay | Admitting: Internal Medicine

## 2012-03-31 MED ORDER — ATENOLOL 25 MG PO TABS: 25.0000 mg | ORAL_TABLET | Freq: Every day | ORAL | Status: DC

## 2012-03-31 MED ORDER — ONDANSETRON 4 MG PO TBDP: 4.0000 mg | ORAL_TABLET | Freq: Three times a day (TID) | ORAL | Status: AC | PRN

## 2012-03-31 MED ORDER — TRAZODONE HCL 50 MG PO TABS: 100.0000 mg | ORAL_TABLET | Freq: Every day | ORAL | Status: DC

## 2012-03-31 NOTE — Telephone Encounter (Signed)
Ms Beauchaine called.  She had return of upper abd pain with nausea over the past several days.  She is particularly stressed right now at home.  Has not been sleeping well.  Is out of BP meds.  No vomiting.  No fevers.  No bleeding. Taking protonix daily.  Using tramadol for chronic pains, but doesn't seem to last long enough.  This is likely a flare of her IBS type symptoms.  She has been treated for PUD with H pylori in the past and her ulcers were documented to have healed.  She has remained on PPI. She thinks this is mostly stress related, and perhaps it is.  Her BP is a bit high at home as she has run out of atenolol in the last 2 days.  I have refilled atenolol 25 mg daily, refill trazodone 100 mg qhsprn sleep.  I also with give her 20 tab of zofran ODT for nausea. I've asked she call if not better soon or if her symptoms worsen. She voiced understanding and thanked me for the call.

## 2012-04-13 ENCOUNTER — Emergency Department (HOSPITAL_COMMUNITY): Payer: BC Managed Care – PPO

## 2012-04-13 ENCOUNTER — Emergency Department (HOSPITAL_COMMUNITY)
Admission: EM | Admit: 2012-04-13 | Discharge: 2012-04-13 | Disposition: A | Discharge status: Home / Self Care | Payer: BC Managed Care – PPO | Attending: Emergency Medicine | Admitting: Emergency Medicine

## 2012-04-13 ENCOUNTER — Encounter (HOSPITAL_COMMUNITY): Payer: Self-pay | Admitting: *Deleted

## 2012-04-13 DIAGNOSIS — K623 Rectal prolapse: Secondary | ICD-10-CM | POA: Insufficient documentation

## 2012-04-13 DIAGNOSIS — F3289 Other specified depressive episodes: Secondary | ICD-10-CM | POA: Insufficient documentation

## 2012-04-13 DIAGNOSIS — K644 Residual hemorrhoidal skin tags: Secondary | ICD-10-CM | POA: Insufficient documentation

## 2012-04-13 DIAGNOSIS — Z79899 Other long term (current) drug therapy: Secondary | ICD-10-CM | POA: Insufficient documentation

## 2012-04-13 DIAGNOSIS — K5900 Constipation, unspecified: Secondary | ICD-10-CM | POA: Insufficient documentation

## 2012-04-13 DIAGNOSIS — I10 Essential (primary) hypertension: Secondary | ICD-10-CM | POA: Insufficient documentation

## 2012-04-13 DIAGNOSIS — F17200 Nicotine dependence, unspecified, uncomplicated: Secondary | ICD-10-CM | POA: Insufficient documentation

## 2012-04-13 DIAGNOSIS — F329 Major depressive disorder, single episode, unspecified: Secondary | ICD-10-CM | POA: Insufficient documentation

## 2012-04-13 DIAGNOSIS — Z9089 Acquired absence of other organs: Secondary | ICD-10-CM | POA: Insufficient documentation

## 2012-04-13 LAB — COMPREHENSIVE METABOLIC PANEL
ALT: 10 U/L (ref 0–35)
AST: 17 U/L (ref 0–37)
Albumin: 4 g/dL (ref 3.5–5.2)
Alkaline Phosphatase: 69 U/L (ref 39–117)
CO2: 19 mEq/L (ref 19–32)
Chloride: 100 mEq/L (ref 96–112)
Creatinine, Ser: 0.56 mg/dL (ref 0.50–1.10)
GFR calc non Af Amer: >90 mL/min (ref >90)
Potassium: 4 mEq/L (ref 3.5–5.1)
Total Bilirubin: 0.8 mg/dL (ref 0.3–1.2)

## 2012-04-13 LAB — URINALYSIS, ROUTINE W REFLEX MICROSCOPIC
Bilirubin Urine: NEGATIVE
Glucose, UA: NEGATIVE mg/dL
Ketones, ur: 15 mg/dL — AB
Leukocytes, UA: NEGATIVE
Protein, ur: NEGATIVE mg/dL
pH: 6.5 (ref 5.0–8.0)

## 2012-04-13 LAB — CBC WITH DIFFERENTIAL/PLATELET
Basophils Absolute: 0 10*3/uL (ref 0.0–0.1)
Basophils Relative: 0 % (ref 0–1)
HCT: 45.7 % (ref 36.0–46.0)
Hemoglobin: 16 g/dL — ABNORMAL HIGH (ref 12.0–15.0)
Lymphocytes Relative: 11 % — ABNORMAL LOW (ref 12–46)
MCHC: 35 g/dL (ref 30.0–36.0)
Monocytes Absolute: 1.5 10*3/uL — ABNORMAL HIGH (ref 0.1–1.0)
Neutro Abs: 18 10*3/uL — ABNORMAL HIGH (ref 1.7–7.7)
Neutrophils Relative %: 82 % — ABNORMAL HIGH (ref 43–77)
RDW: 13.4 % (ref 11.5–15.5)
WBC: 21.9 10*3/uL — ABNORMAL HIGH (ref 4.0–10.5)

## 2012-04-13 LAB — URINE MICROSCOPIC-ADD ON

## 2012-04-13 MED ORDER — HYDROMORPHONE HCL PF 1 MG/ML IJ SOLN
1.0000 mg | Freq: Once | INTRAMUSCULAR | Status: AC
  Administered 2012-04-13: 1 mg via INTRAVENOUS
  Filled 2012-04-13: qty 1

## 2012-04-13 MED ORDER — POLYETHYLENE GLYCOL 3350 17 GM/SCOOP PO POWD: 17.0000 g | Freq: Every day | ORAL | Status: AC

## 2012-04-13 MED ORDER — HYDROMORPHONE HCL PF 1 MG/ML IJ SOLN: 1.0000 mg | Freq: Once | INTRAMUSCULAR | Status: DC

## 2012-04-13 MED ORDER — HYDROMORPHONE HCL PF 1 MG/ML IJ SOLN
0.5000 mg | Freq: Once | INTRAMUSCULAR | Status: AC
  Administered 2012-04-13: 0.5 mg via INTRAVENOUS
  Filled 2012-04-13: qty 1

## 2012-04-13 MED ORDER — MAGNESIUM CITRATE PO SOLN
1.0000 | Freq: Once | ORAL | Status: AC
  Administered 2012-04-13: 1 via ORAL
  Filled 2012-04-13: qty 296

## 2012-04-13 MED ORDER — FLEET ENEMA 7-19 GM/118ML RE ENEM
1.0000 | ENEMA | Freq: Once | RECTAL | Status: AC
  Administered 2012-04-13: 1 via RECTAL
  Filled 2012-04-13: qty 1

## 2012-04-13 MED ORDER — ONDANSETRON HCL 4 MG/2ML IJ SOLN
INTRAMUSCULAR | Status: AC
  Administered 2012-04-13: 4 mg
  Filled 2012-04-13: qty 2

## 2012-04-13 MED ORDER — MORPHINE SULFATE 4 MG/ML IJ SOLN
4.0000 mg | Freq: Once | INTRAMUSCULAR | Status: AC
  Administered 2012-04-13: 4 mg via INTRAVENOUS
  Filled 2012-04-13: qty 1

## 2012-04-13 MED ORDER — DOCUSATE SODIUM 100 MG PO CAPS: 100.0000 mg | ORAL_CAPSULE | Freq: Two times a day (BID) | ORAL | Status: AC

## 2012-04-13 MED ORDER — LORAZEPAM 2 MG/ML IJ SOLN
1.0000 mg | Freq: Once | INTRAMUSCULAR | Status: AC
  Administered 2012-04-13: 1 mg via INTRAVENOUS
  Filled 2012-04-13: qty 1

## 2012-04-13 MED ORDER — IOHEXOL 300 MG/ML  SOLN
100.0000 mL | Freq: Once | INTRAMUSCULAR | Status: AC | PRN
  Administered 2012-04-13: 100 mL via INTRAVENOUS

## 2012-04-13 MED ORDER — PROMETHAZINE HCL 25 MG/ML IJ SOLN
12.5000 mg | INTRAMUSCULAR | Status: AC
  Administered 2012-04-13: 12.5 mg via INTRAVENOUS
  Filled 2012-04-13: qty 1

## 2012-04-13 MED ORDER — IOHEXOL 300 MG/ML  SOLN: 20.0000 mL | INTRAMUSCULAR | Status: AC

## 2012-04-13 MED ORDER — METOCLOPRAMIDE HCL 5 MG/ML IJ SOLN
10.0000 mg | Freq: Once | INTRAMUSCULAR | Status: AC
  Administered 2012-04-13: 10 mg via INTRAVENOUS
  Filled 2012-04-13: qty 2

## 2012-04-13 MED ORDER — SODIUM CHLORIDE 0.9 % IV BOLUS (SEPSIS)
500.0000 mL | Freq: Once | INTRAVENOUS | Status: AC
  Administered 2012-04-13: 500 mL via INTRAVENOUS

## 2012-04-13 NOTE — ED Notes (Addendum)
Pt in via EMS- pt in c/o constipation x5 days, pt with no relief with home enemas, states she has not tried any laxatives, pt in c/o abd pain and anxiety. Pt states she was recently placed on medication for constipation but she has not been able to start this yet. C/o nausea but denies vomiting

## 2012-04-13 NOTE — ED Notes (Signed)
Pt to bathroom to attempt bowel movement.

## 2012-04-13 NOTE — ED Notes (Signed)
Pt unable to give a urine sample, pt stated every time she "pushed to urinate, she had a watery bowel movement".

## 2012-04-13 NOTE — ED Provider Notes (Signed)
4:40 PM Assumed care of patient in the CDU.  Patient presented today with abdominal pain and constipation.  Patient had a CT done of her abdomen, which did not show any acute findings.  Patient brought to the CDU for symptomatic control. Patient has not yet been given any medication for constipation.   Will order patient Magnesium Citrate and an enema and then reassess..     6:45 PM Reassessed patient.  She reports that her pain has improved.  She has had a couple of bowel movements.  Pain improved after bowel movements.  No acute distress.  Patient alert and orientated x 3, Heart RRR, Lungs CTAB, Abdomen soft and nontender, Rectum with several nonthrombosed external hemorrhoids.  Patient requesting discharge.  Feel that patient can be discharged home.  Patient instructed to follow up with GI.  Patient also given prescription for Miralax and Colace.  Pascal Lux [place], PA-C 04/14/12 [phone]

## 2012-04-13 NOTE — ED Provider Notes (Signed)
History     CSN: [id number]  Arrival date & time 04/13/12  1045   First MD Initiated Contact with Patient 04/13/12 1127      Chief Complaint  Patient presents with  . Constipation    (Consider location/radiation/quality/duration/timing/severity/associated sxs/prior treatment) HPI  51 year old female with hx of abdominal surgery presents complaining of constipation. Patient reports having constipation for the past 5 days. Reports having increased thirst or having bowel movement without able to produce a good bowel movement. Reports having diffuse abdominal pain and described as a cramping sensation, constant, with associated nausea without vomiting. Have subjective fever and chills. Has history of hemorrhoid and is having increasing rectal pain while trying to having bowel movement. Denies blood per rectum. She has tried enema at home without any relief. She has prior history of bile duct obstruction but no history of small bowel obstruction. Denies chest pain or shortness of breath, back pain, urinary symptoms. Has history of anxiety and having increased anxiety due to unable to produce bowel movement.  Past Medical History  Diagnosis Date  . Hypertension   . Gastritis 05/2011    EGD-Dr. Rhea Belton   . Gastric ulcer 05/2011    EGD-Dr. Rhea Belton   . Depression   . Nicotine dependence   . Alcohol dependence     Quit Sept. 2012   . Helicobacter pylori (H. pylori) infection     Hx of     Past Surgical History  Procedure Date  . Appendectomy   . Cesarean section     Family History  Problem Relation Age of Onset  . Colon cancer Neg Hx     History  Substance Use Topics  . Smoking status: Current Everyday Smoker  . Smokeless tobacco: Never Used   Comment: Counseling to quit smoking given to patient in exam room   . Alcohol Use: No     Heavy drinker quit  Sept 2012     OB History    Grav Para Term Preterm Abortions TAB SAB Ect Mult Living                  Review of Systems    All other systems reviewed and are negative.    Allergies  Aspirin and Vicodin  Home Medications   Current Outpatient Rx  Name Route Sig Dispense Refill  . ATENOLOL 25 MG PO TABS Oral Take 25 mg by mouth daily.    Marland Kitchen CITALOPRAM HYDROBROMIDE 20 MG PO TABS Oral Take 20 mg by mouth daily.    Marland Kitchen LORAZEPAM 0.5 MG PO TABS Oral Take 0.5 mg by mouth every 6 (six) hours as needed. For anxiety    . MELATONIN 10 MG PO TABS Oral Take 10 mg by mouth at bedtime.    Marland Kitchen PANTOPRAZOLE SODIUM 40 MG PO TBEC Oral Take 40 mg by mouth daily.      Marland Kitchen TEMAZEPAM 15 MG PO CAPS Oral Take 15 mg by mouth at bedtime as needed. For sleep    . TRAMADOL HCL 50 MG PO TABS Oral Take 50 mg by mouth every 6 (six) hours as needed. For pain    . TRAZODONE HCL 50 MG PO TABS Oral Take 100 mg by mouth at bedtime.      There were no vitals taken for this visit.  Physical Exam  Nursing note and vitals reviewed. Constitutional: She is oriented to person, place, and time. She appears well-developed and well-nourished. No distress.       Awake, alert, nontoxic  appearance  HENT:  Head: Atraumatic.  Eyes: Conjunctivae are normal. Right eye exhibits no discharge. Left eye exhibits no discharge.  Neck: Neck supple.  Cardiovascular: Normal rate and regular rhythm.   Pulmonary/Chest: Effort normal. No respiratory distress. She exhibits no tenderness.  Abdominal: Soft. There is no tenderness. There is no rebound.       Generalized abdominal tenderness, most significant at left lower quadrant. Guarding without rebound tenderness  Horizontal surgical scar below abdomen and consistence with C-section. No evidence of hernia noted  Genitourinary: Guaiac positive stool.       Chaperone present  A non-thrombosed rectal prolapse visible on exam, ttp, no evidence of thrombosis.  No gross blood  Musculoskeletal: Normal range of motion. She exhibits no tenderness.       ROM appears intact, no obvious focal weakness  Neurological: She is  alert and oriented to person, place, and time.       Mental status and motor strength appears intact  Skin: Skin is warm. No rash noted.  Psychiatric: She has a normal mood and affect.    ED Course  Procedures (including critical care time)  Results for orders placed during the hospital encounter of 04/13/12  CBC WITH DIFFERENTIAL      Component Value Range   WBC 21.9 (*) 4.0 - 10.5 K/uL   RBC 5.34 (*) 3.87 - 5.11 MIL/uL   Hemoglobin 16.0 (*) 12.0 - 15.0 g/dL   HCT 62.1  30.8 - 65.7 %   MCV 85.6  78.0 - 100.0 fL   MCH 30.0  26.0 - 34.0 pg   MCHC 35.0  30.0 - 36.0 g/dL   RDW 84.6  96.2 - 95.2 %   Platelets 351  150 - 400 K/uL   Neutrophils Relative 82 (*) 43 - 77 %   Neutro Abs 18.0 (*) 1.7 - 7.7 K/uL   Lymphocytes Relative 11 (*) 12 - 46 %   Lymphs Abs 2.3  0.7 - 4.0 K/uL   Monocytes Relative 7  3 - 12 %   Monocytes Absolute 1.5 (*) 0.1 - 1.0 K/uL   Eosinophils Relative 0  0 - 5 %   Eosinophils Absolute 0.1  0.0 - 0.7 K/uL   Basophils Relative 0  0 - 1 %   Basophils Absolute 0.0  0.0 - 0.1 K/uL  COMPREHENSIVE METABOLIC PANEL      Component Value Range   Sodium 135  135 - 145 mEq/L   Potassium 4.0  3.5 - 5.1 mEq/L   Chloride 100  96 - 112 mEq/L   CO2 19  19 - 32 mEq/L   Glucose, Bld 118 (*) 70 - 99 mg/dL   BUN 6  6 - 23 mg/dL   Creatinine, Ser 8.41  0.50 - 1.10 mg/dL   Calcium 9.4  8.4 - 32.4 mg/dL   Total Protein 7.2  6.0 - 8.3 g/dL   Albumin 4.0  3.5 - 5.2 g/dL   AST 17  0 - 37 U/L   ALT 10  0 - 35 U/L   Alkaline Phosphatase 69  39 - 117 U/L   Total Bilirubin 0.8  0.3 - 1.2 mg/dL   GFR calc non Af Amer >90  >90 mL/min   GFR calc Af Amer >90  >90 mL/min  LIPASE, BLOOD      Component Value Range   Lipase 15  11 - 59 U/L  OCCULT BLOOD, POC DEVICE      Component Value Range   Fecal  Occult Bld POSITIVE     Ct Abdomen Pelvis W Contrast  04/13/2012  *RADIOLOGY REPORT*  Clinical Data: Abdominal pain, constipation, elevated white blood cell count, history of gastric  ulcer, hypertension and appendectomy  CT ABDOMEN AND PELVIS WITH CONTRAST  Technique:  Multidetector CT imaging of the abdomen and pelvis was performed following the standard protocol during bolus administration of intravenous contrast.  Contrast: OMNIPAQUE IOHEXOL 300 MG/ML  SOLN  Comparison: Abdominal MRI - 06/22/2011  Findings:  Normal hepatic contour. No discrete hepatic lesions. Unchanged mild intrahepatic biliary dilatation.  Normal appearance of the gallbladder.  There is unchanged mild dilatation of the common bile duct, measuring approximately 9 mm in greatest transverse axial dimension (image 28, series 2).  No ascites.  There is symmetric enhancement and excretion of the bilateral kidneys.  No definite renal stones on the post contrast examination.  No urinary obstruction.  No perinephric stranding. Normal appearance of the bilateral adrenal glands, pancreas and spleen.  A minimal amount enteric contrast has been ingested and extends to the level of the mid/distal small bowel.  Moderate to large colonic stool burden without evidence of obstruction.  The appendix is not visualized compatible with provided surgical history.  No pneumoperitoneum, pneumatosis or portal venous gas.  Scattered atherosclerotic calcifications within a normal caliber abdominal aorta.  The major branch vessels of the abdominal aorta are patent.  Incidental note is made of a retroaortic left sided renal vein.  No retroperitoneal, mesenteric, pelvic or inguinal lymphadenopathy.  Pelvic organs are normal for age with bilateral ovarian cysts.  An IUD is seen within the uterus.  No free fluid in the pelvis.  Limited visualization of the lower thorax demonstrates bibasilar dependent subpleural ground-glass opacities, right greater than left, likely atelectasis.  No focal airspace opacity or pleural effusion.  Normal heart size.  Small amount of pericardial fluid, presumably physiologic.  No acute or aggressive osseous  abnormalities.  IMPRESSION:  1.  Moderate to large colonic stool burden without evidence of enteric obstruction. 2.  Bilateral ovarian cysts, presumably physiologic.  3. Stable mild dilatation of the intrahepatic biliary system and common bile duct similar to prior abdominal MRI performed 06/2011.  Original Report Authenticated By: Waynard Reeds, M.D.   Dg Abd Acute W/chest  04/13/2012  *RADIOLOGY REPORT*  Clinical Data: Constipation, right lower quadrant abdominal pain  ACUTE ABDOMEN SERIES (ABDOMEN 2 VIEW & CHEST 1 VIEW)  Comparison: 07/04/2011; 06/21/2011  Findings:  Grossly unchanged cardiac silhouette and mediastinal contours. Granuloma again overlies the left lower lung.  There is persistent mild eventration of the right hemidiaphragm.  No focal parenchymal opacity.  No definite pleural effusion or pneumothorax.  Nonobstructive bowel gas pattern.  No definite abnormal intra- abdominal calcifications.  An IUD device overlies the pelvis.  Unchanged relatively mild scoliotic curvature of the thoracolumbar spine.  Post lower cervical ACDF, incompletely imaged.  IMPRESSION: 1.  No acute cardiopulmonary disease. 2.  Nonobstructive bowel gas pattern.  Original Report Authenticated By: Waynard Reeds, M.D.     1. Rectal prolapse  MDM  Constipation with diffused abd tenderness on exam.  Pt has evidence of non-thrombosed rectal prolapse on exam, possibly obstructing her rectum.  Acute abd xray shows no evidence of obstruction.     2:11 PM Pt continues to endorse abd pain.  Pain medication given.  Pt has elevated WBC.  Abd/pelvic CT ordered for further evaluation.    3:25 PM Report given to [place], PA-C in CDU, who  will continue to monitor patient and dispo as appropriate.  Discussed care with my attending, who agrees with plan.     Fayrene Helper, PA-C 04/13/12 1528

## 2012-04-14 NOTE — ED Provider Notes (Signed)
Medical screening examination/treatment/procedure(s) were performed by non-physician practitioner and as supervising physician I was immediately available for consultation/collaboration.   Loren Racer, MD 04/14/12 [phone]

## 2012-04-26 NOTE — Telephone Encounter (Signed)
See note on 03-27-12

## 2012-05-20 ENCOUNTER — Other Ambulatory Visit: Payer: Self-pay | Admitting: Internal Medicine

## 2012-05-21 MED ORDER — TRAMADOL HCL 50 MG PO TABS: 50.0000 mg | ORAL_TABLET | Freq: Four times a day (QID) | ORAL | Status: DC | PRN

## 2012-05-31 ENCOUNTER — Other Ambulatory Visit: Payer: Self-pay | Admitting: Internal Medicine

## 2012-06-28 ENCOUNTER — Other Ambulatory Visit: Payer: Self-pay | Admitting: Internal Medicine

## 2012-07-26 ENCOUNTER — Telehealth: Payer: Self-pay | Admitting: Gastroenterology

## 2012-07-26 NOTE — Telephone Encounter (Signed)
Per Dr. Lauro Franklin last office visit note, He gave pt an Rx for Trazodone, but said he would not refill it and pt. Should follow up with PCP regarding medication.  [facility] faxed over a refill authorization for the medication, I denied it, and put comment: to have pt follow up with PCP.

## 2012-08-22 ENCOUNTER — Other Ambulatory Visit: Payer: Self-pay | Admitting: Gastroenterology

## 2012-11-19 ENCOUNTER — Encounter: Payer: Self-pay | Admitting: Internal Medicine

## 2012-11-19 ENCOUNTER — Other Ambulatory Visit: Payer: Self-pay | Admitting: Internal Medicine

## 2012-11-19 DIAGNOSIS — R1011 Right upper quadrant pain: Secondary | ICD-10-CM

## 2012-11-20 ENCOUNTER — Ambulatory Visit (INDEPENDENT_AMBULATORY_CARE_PROVIDER_SITE_OTHER): Payer: BC Managed Care – PPO | Admitting: Internal Medicine

## 2012-11-20 ENCOUNTER — Encounter: Payer: Self-pay | Admitting: Internal Medicine

## 2012-11-20 VITALS — BP 118/74 | HR 64 | Ht 64.0 in | Wt 145.0 lb

## 2012-11-20 DIAGNOSIS — R110 Nausea: Secondary | ICD-10-CM

## 2012-11-20 DIAGNOSIS — R1011 Right upper quadrant pain: Secondary | ICD-10-CM

## 2012-11-20 DIAGNOSIS — R109 Unspecified abdominal pain: Secondary | ICD-10-CM

## 2012-11-20 MED ORDER — HYDROCODONE-ACETAMINOPHEN 5-325 MG PO TABS: 1.0000 | ORAL_TABLET | Freq: Four times a day (QID) | ORAL | Status: DC | PRN

## 2012-11-20 MED ORDER — PROMETHAZINE HCL 12.5 MG PO TABS: 25.0000 mg | ORAL_TABLET | Freq: Four times a day (QID) | ORAL | Status: DC | PRN

## 2012-11-20 NOTE — Progress Notes (Signed)
Patient ID: Selena Farrell, female   DOB: 1961/01/19, 52 y.o.   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 She is alone today. She reports over the last several months, but more specifically over the past week she has noticed an epigastric and right upper quadrant abdominal pain which radiates into her back and right shoulder. This is an intermittent pain which tends to come and go but can last for one to 2 hours. It does seem to be worse with eating. She is worried that it might be her gallbladder and notes that all 4 of her siblings have needed cholecystectomy for gallbladder disease. She's had associated nausea but no vomiting. She also reports a poor appetite and has noticed increased upper GI gas and belching. She has continue to strictly avoid NSAIDs. No change in bowel habits other than noticing mild constipation over last several days. No blood in her stool or melena. She denies jaundice, itching, and dark urine. She tried tramadol for this pain without benefit. No fevers or chills.  Review of Systems  As per history of present illness, otherwise negative   Past Medical History  Diagnosis Date  . Hypertension   . Gastritis 05/2011    EGD-Dr. Rhea Farrell   . Gastric ulcer 05/2011    EGD-Dr. Rhea Farrell   . Depression   . Nicotine dependence   . Alcohol dependence     Quit Sept. 2012   . Helicobacter pylori (H. pylori) infection     Hx of     Current Outpatient Prescriptions  Medication Sig Dispense Refill  . atenolol (TENORMIN) 25 MG tablet TAKE 1 TABLET (25 MG TOTAL) BY MOUTH DAILY.  30 tablet  1  . citalopram (CELEXA) 20 MG tablet Take 20 mg by mouth daily.      Marland Kitchen LORazepam (ATIVAN) 0.5 MG tablet Take 0.5 mg by mouth every 6 (six) hours as needed. For anxiety      .  Melatonin 10 MG TABS Take 10 mg by mouth at bedtime.      . pantoprazole (PROTONIX) 40 MG tablet Take 40 mg by mouth daily.        . temazepam (RESTORIL) 15 MG capsule Take 15 mg by mouth at bedtime as needed. For sleep      . traMADol (ULTRAM) 50 MG tablet TAKE 1 TABLET (50 MG TOTAL) BY MOUTH EVERY 6 (SIX) HOURS AS NEEDED FOR PAIN.  90 tablet  1  . traZODone (DESYREL) 50 MG tablet Take 100 mg by mouth at bedtime.       Marland Kitchen HYDROcodone-acetaminophen (NORCO/VICODIN) 5-325 MG per tablet Take 1 tablet by mouth every 6 (six) hours as needed for pain.  45 tablet  0  . promethazine (PHENERGAN) 12.5 MG tablet Take 2 tablets (25 mg total) by mouth every 6 (six) hours as needed for nausea.  30 tablet  0   No current facility-administered medications for this visit.    Allergies  Allergen Reactions  . Aspirin Other (See Comments)    Reaction unknown  . Vicodin (Hydrocodone-Acetaminophen) Other (See Comments)    Reaction unknown    Family History  Problem Relation Age of Onset  . Colon cancer Neg Hx     History  Substance Use Topics  . Smoking status:  Current Every Day Smoker -- 0.50 packs/day    Types: Cigarettes  . Smokeless tobacco: Never Used     Comment: Counseling to quit smoking given to patient in exam room   . Alcohol Use: No     Comment: Heavy drinker quit  Sept 2012     OBJECTIVE: BP 118/74  Pulse 64  Ht 5\' 4"  (1.626 m)  Wt 145 lb (65.772 kg)  BMI 24.88 kg/m2 Constitutional: Well-developed and well-nourished. No distress. HEENT: Normocephalic and atraumatic. Oropharynx is clear and moist. No oropharyngeal exudate. Conjunctivae are normal. No scleral icterus. Neck: Neck supple. Trachea midline. Cardiovascular: Normal rate, regular rhythm and intact distal pulses. No M/R/G Pulmonary/chest: Effort normal and breath sounds normal. No wheezing, rales or rhonchi. Abdominal: Soft, mild to moderate epigastric and right upper quadrant tenderness without rebound or guarding,  nondistended. Bowel sounds active throughout.  Extremities: no clubbing, cyanosis, or edema Lymphadenopathy: No cervical adenopathy noted. Neurological: Alert and oriented to person place and time. Skin: Skin is warm and dry. No rashes noted. Psychiatric: Normal mood and affect. Behavior is normal.  Labs and Imaging -- Labs performed recently by Dr. Selena Farrell, her PCP. These have been requested. She was also scheduled for an abdominal ultrasound this Thursday ordered by Dr. 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   1.  RUQ pain/nausea -- her symptoms could be gallbladder related, and I agree with the abdominal ultrasound. If this is negative then we will proceed with a HIDA scan.  Also given her history of H. pylori peptic ulcer disease, if her gallbladder workup is unrevealing she will likely require repeat endoscopy. She has avoided NSAIDs, and she is encouraged to continue to do so. She is nontoxic-appearing, but I do want to review her labs done yesterday by her primary care provider. We've requested these be faxed to [place] today. Recurrent papillary stenosis is felt less likely, but liver tests and ultrasound should help in this regard. I will give her prescription for Vicodin to be used as needed and as directed for severe pain. She was also given promethazine to be used as needed and as directed for nausea.

## 2012-11-20 NOTE — Patient Instructions (Addendum)
You have been scheduled for a HIDA scan at [facility] on 11/27/2012  @ 12:30pm  Please arrive 15 minutes prior to your appointment. If you need to reschedule please call [phone]  Have Nothing to eat or drink and NO VICODEN 6 hours prior to your scan.   We have sent the following medications to your pharmacy for you to pick up at your convenience: Vicoden and Phenagren please take all meds as prescribed.   Stay away from NSAIDS

## 2012-11-22 ENCOUNTER — Telehealth: Payer: Self-pay | Admitting: *Deleted

## 2012-11-22 ENCOUNTER — Ambulatory Visit
Admission: RE | Admit: 2012-11-22 | Discharge: 2012-11-22 | Disposition: A | Discharge status: Home / Self Care | Payer: BC Managed Care – PPO | Source: Ambulatory Visit | Attending: Internal Medicine | Admitting: Internal Medicine

## 2012-11-22 DIAGNOSIS — R935 Abnormal findings on diagnostic imaging of other abdominal regions, including retroperitoneum: Secondary | ICD-10-CM

## 2012-11-22 DIAGNOSIS — R1011 Right upper quadrant pain: Secondary | ICD-10-CM

## 2012-11-22 DIAGNOSIS — R109 Unspecified abdominal pain: Secondary | ICD-10-CM

## 2012-11-22 NOTE — Telephone Encounter (Signed)
Cancelled [facility] HIDA scan per Dr Rhea Belton. Ordered 2View of the Abdomen to r/o a CBD stent; also ordered MRCP to evaluate CBD abnormality seen by U/S. Spoke with pt to inform her she has debris in her CBD and she needs an XRAY today and an MRCP tomorrow. She can have the abd XRAY done here, but d/t her HTN she needs labs, cmet. She reports she had them done at Dr Kim's ofc yesterday. MRCP tomorrow at 5pm at [facility], NPO 4 hrs prior. Pt states understanding. 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 Thanks.  Received labs from Dr Selena Batten and results taxed to [facility] Scheduling [phone]

## 2012-11-23 ENCOUNTER — Other Ambulatory Visit: Payer: Self-pay | Admitting: Internal Medicine

## 2012-11-23 ENCOUNTER — Telehealth: Payer: Self-pay | Admitting: Internal Medicine

## 2012-11-23 ENCOUNTER — Ambulatory Visit (INDEPENDENT_AMBULATORY_CARE_PROVIDER_SITE_OTHER)
Admission: RE | Admit: 2012-11-23 | Discharge: 2012-11-23 | Disposition: A | Discharge status: Home / Self Care | Payer: BC Managed Care – PPO | Source: Ambulatory Visit | Attending: Internal Medicine | Admitting: Internal Medicine

## 2012-11-23 ENCOUNTER — Ambulatory Visit (HOSPITAL_COMMUNITY)
Admission: RE | Admit: 2012-11-23 | Discharge: 2012-11-23 | Disposition: A | Discharge status: Home / Self Care | Payer: BC Managed Care – PPO | Source: Ambulatory Visit | Attending: Internal Medicine | Admitting: Internal Medicine

## 2012-11-23 DIAGNOSIS — R935 Abnormal findings on diagnostic imaging of other abdominal regions, including retroperitoneum: Secondary | ICD-10-CM

## 2012-11-23 DIAGNOSIS — R1013 Epigastric pain: Secondary | ICD-10-CM | POA: Insufficient documentation

## 2012-11-23 DIAGNOSIS — K838 Other specified diseases of biliary tract: Secondary | ICD-10-CM | POA: Insufficient documentation

## 2012-11-23 MED ORDER — GADOBENATE DIMEGLUMINE 529 MG/ML IV SOLN
13.0000 mL | Freq: Once | INTRAVENOUS | Status: AC | PRN
  Administered 2012-11-23: 13 mL via INTRAVENOUS

## 2012-11-26 LAB — POCT I-STAT, CHEM 8
BUN: 6 mg/dL (ref 6–23)
Calcium, Ion: 1.23 mmol/L (ref 1.12–1.23)
Chloride: 102 mEq/L (ref 96–112)
Glucose, Bld: 88 mg/dL (ref 70–99)

## 2012-11-26 NOTE — Telephone Encounter (Signed)
MRCP shows a slightly enlarged CBD which is NOT new for her.  No stones or debris seen in the bile ducts.  Liver and pancreas look normal. [place] was a false positive for debris in the bile duct. Would proceed to HIDA with [facility] given her recent symptoms. Did we get recent LFTs or CMP from Dr. Selena Batten??

## 2012-11-26 NOTE — Telephone Encounter (Signed)
R/S the Hida I cancelled for the same time. Again, instructed her of time and location and NPO status. Pt stated understanding.

## 2012-11-26 NOTE — Telephone Encounter (Signed)
Yes okay for work excuse while work up of abd pain ongoing

## 2012-11-26 NOTE — Addendum Note (Signed)
Addended by: Florene Glen on: 11/26/2012 08:52 AM   Modules accepted: Orders

## 2012-11-26 NOTE — Telephone Encounter (Signed)
Pt states she just called about the results; informed her Dr Rhea Belton will review them today and we will let her know. Pt stated understanding.

## 2012-11-26 NOTE — Telephone Encounter (Signed)
Faxed work excuse to [employer], [phone]

## 2012-11-26 NOTE — Telephone Encounter (Signed)
Pt called back for a work excuse; OK to make it until the end of this week, 11/30/12? Also, labs: total bili 0.3; alk phos 86; ast 21; alt 24; all normal. Only things abnormal were: WBC 11.5; BUN 6; BUN/CREA Ratio 6.0  Range 11-26.

## 2012-11-27 ENCOUNTER — Telehealth: Payer: Self-pay | Admitting: *Deleted

## 2012-11-27 ENCOUNTER — Inpatient Hospital Stay (HOSPITAL_COMMUNITY)
Admission: EM | Admit: 2012-11-27 | Discharge: 2012-11-30 | DRG: 493 | Disposition: A | Discharge status: Home / Self Care | Payer: BC Managed Care – PPO | Attending: Internal Medicine | Admitting: Internal Medicine

## 2012-11-27 ENCOUNTER — Encounter (HOSPITAL_COMMUNITY)
Admission: RE | Admit: 2012-11-27 | Discharge: 2012-11-27 | Disposition: A | Discharge status: Home / Self Care | Payer: BC Managed Care – PPO | Source: Ambulatory Visit | Attending: Internal Medicine | Admitting: Internal Medicine

## 2012-11-27 ENCOUNTER — Encounter (HOSPITAL_COMMUNITY): Payer: BC Managed Care – PPO

## 2012-11-27 DIAGNOSIS — B9681 Helicobacter pylori [H. pylori] as the cause of diseases classified elsewhere: Secondary | ICD-10-CM

## 2012-11-27 DIAGNOSIS — F17200 Nicotine dependence, unspecified, uncomplicated: Secondary | ICD-10-CM | POA: Diagnosis present

## 2012-11-27 DIAGNOSIS — Z9089 Acquired absence of other organs: Secondary | ICD-10-CM

## 2012-11-27 DIAGNOSIS — R948 Abnormal results of function studies of other organs and systems: Secondary | ICD-10-CM

## 2012-11-27 DIAGNOSIS — E876 Hypokalemia: Secondary | ICD-10-CM | POA: Diagnosis present

## 2012-11-27 DIAGNOSIS — R109 Unspecified abdominal pain: Secondary | ICD-10-CM

## 2012-11-27 DIAGNOSIS — R110 Nausea: Secondary | ICD-10-CM

## 2012-11-27 DIAGNOSIS — K838 Other specified diseases of biliary tract: Secondary | ICD-10-CM | POA: Diagnosis present

## 2012-11-27 DIAGNOSIS — K279 Peptic ulcer, site unspecified, unspecified as acute or chronic, without hemorrhage or perforation: Secondary | ICD-10-CM | POA: Diagnosis present

## 2012-11-27 DIAGNOSIS — K828 Other specified diseases of gallbladder: Secondary | ICD-10-CM

## 2012-11-27 DIAGNOSIS — R1013 Epigastric pain: Secondary | ICD-10-CM

## 2012-11-27 DIAGNOSIS — Z886 Allergy status to analgesic agent status: Secondary | ICD-10-CM

## 2012-11-27 DIAGNOSIS — Z720 Tobacco use: Secondary | ICD-10-CM

## 2012-11-27 DIAGNOSIS — F3289 Other specified depressive episodes: Secondary | ICD-10-CM | POA: Diagnosis present

## 2012-11-27 DIAGNOSIS — Z800 Family history of malignant neoplasm of digestive organs: Secondary | ICD-10-CM

## 2012-11-27 DIAGNOSIS — R1011 Right upper quadrant pain: Secondary | ICD-10-CM | POA: Diagnosis present

## 2012-11-27 DIAGNOSIS — Z79899 Other long term (current) drug therapy: Secondary | ICD-10-CM

## 2012-11-27 DIAGNOSIS — D72829 Elevated white blood cell count, unspecified: Secondary | ICD-10-CM | POA: Diagnosis present

## 2012-11-27 DIAGNOSIS — K319 Disease of stomach and duodenum, unspecified: Secondary | ICD-10-CM | POA: Diagnosis present

## 2012-11-27 DIAGNOSIS — A048 Other specified bacterial intestinal infections: Secondary | ICD-10-CM | POA: Diagnosis present

## 2012-11-27 DIAGNOSIS — F1011 Alcohol abuse, in remission: Secondary | ICD-10-CM | POA: Diagnosis present

## 2012-11-27 DIAGNOSIS — R935 Abnormal findings on diagnostic imaging of other abdominal regions, including retroperitoneum: Secondary | ICD-10-CM

## 2012-11-27 DIAGNOSIS — I10 Essential (primary) hypertension: Secondary | ICD-10-CM | POA: Diagnosis present

## 2012-11-27 DIAGNOSIS — K811 Chronic cholecystitis: Principal | ICD-10-CM | POA: Diagnosis present

## 2012-11-27 DIAGNOSIS — Z885 Allergy status to narcotic agent status: Secondary | ICD-10-CM

## 2012-11-27 DIAGNOSIS — K2960 Other gastritis without bleeding: Secondary | ICD-10-CM | POA: Diagnosis present

## 2012-11-27 DIAGNOSIS — K259 Gastric ulcer, unspecified as acute or chronic, without hemorrhage or perforation: Secondary | ICD-10-CM | POA: Diagnosis present

## 2012-11-27 DIAGNOSIS — G8929 Other chronic pain: Secondary | ICD-10-CM | POA: Diagnosis present

## 2012-11-27 LAB — PROTIME-INR
INR: 0.97 (ref 0.00–1.49)
Prothrombin Time: 12.8 seconds (ref 11.6–15.2)

## 2012-11-27 LAB — CBC WITH DIFFERENTIAL/PLATELET
Basophils Absolute: 0 10*3/uL (ref 0.0–0.1)
Basophils Relative: 0 % (ref 0–1)
Eosinophils Relative: 1 % (ref 0–5)
HCT: 40.6 % (ref 36.0–46.0)
MCHC: 34.2 g/dL (ref 30.0–36.0)
MCV: 87.1 fL (ref 78.0–100.0)
Monocytes Absolute: 0.9 10*3/uL (ref 0.1–1.0)
RDW: 12.9 % (ref 11.5–15.5)

## 2012-11-27 LAB — LIPASE, BLOOD: Lipase: 21 U/L (ref 11–59)

## 2012-11-27 LAB — COMPREHENSIVE METABOLIC PANEL
AST: 29 U/L (ref 0–37)
Albumin: 3.8 g/dL (ref 3.5–5.2)
CO2: 27 mEq/L (ref 19–32)
Calcium: 9.2 mg/dL (ref 8.4–10.5)
Creatinine, Ser: 0.82 mg/dL (ref 0.50–1.10)
GFR calc non Af Amer: 81 mL/min — ABNORMAL LOW (ref >90)

## 2012-11-27 MED ORDER — MORPHINE SULFATE 4 MG/ML IJ SOLN
INTRAMUSCULAR | Status: AC
  Filled 2012-11-27: qty 1

## 2012-11-27 MED ORDER — HYDROMORPHONE HCL PF 1 MG/ML IJ SOLN
0.5000 mg | Freq: Once | INTRAMUSCULAR | Status: AC
  Administered 2012-11-27: 0.5 mg via INTRAVENOUS
  Filled 2012-11-27: qty 1

## 2012-11-27 MED ORDER — HYDROMORPHONE HCL PF 1 MG/ML IJ SOLN
0.5000 mg | Freq: Once | INTRAMUSCULAR | Status: AC
  Administered 2012-11-28: 0.5 mg via INTRAVENOUS

## 2012-11-27 MED ORDER — MORPHINE SULFATE 4 MG/ML IJ SOLN
2.6000 mg | Freq: Once | INTRAMUSCULAR | Status: AC
  Administered 2012-11-27: 2.6 mg via INTRAVENOUS

## 2012-11-27 MED ORDER — ONDANSETRON HCL 4 MG/2ML IJ SOLN
4.0000 mg | Freq: Once | INTRAMUSCULAR | Status: AC
  Administered 2012-11-27: 4 mg via INTRAVENOUS
  Filled 2012-11-27: qty 2

## 2012-11-27 MED ORDER — ONDANSETRON HCL 4 MG/2ML IJ SOLN
INTRAMUSCULAR | Status: AC
  Filled 2012-11-27: qty 2

## 2012-11-27 MED ORDER — TECHNETIUM TC 99M MEBROFENIN IV KIT
5.5000 | PACK | Freq: Once | INTRAVENOUS | Status: AC | PRN
  Administered 2012-11-27: 5.5 via INTRAVENOUS

## 2012-11-27 MED ORDER — SODIUM CHLORIDE 0.9 % IV SOLN
1000.0000 mL | Freq: Once | INTRAVENOUS | Status: AC
  Administered 2012-11-27: 1000 mL via INTRAVENOUS

## 2012-11-27 MED ORDER — ONDANSETRON HCL 4 MG/2ML IJ SOLN
4.0000 mg | Freq: Once | INTRAMUSCULAR | Status: AC
  Administered 2012-11-27: 4 mg via INTRAVENOUS

## 2012-11-27 NOTE — ED Provider Notes (Signed)
History     CSN: [id number]  Arrival date & time 11/27/12  2006   First MD Initiated Contact with Patient 11/27/12 2030      Chief Complaint  Patient presents with  . Abdominal Pain    (Consider location/radiation/quality/duration/timing/severity/associated sxs/prior treatment) HPI  The patient presents with worsening abdominal pain.  She notes a months long history of epigastric and right upper quadrant pain.  Today as part of the evaluation of her ongoing pain she had a HIDA scan.  Sternotomy exam the patient felt a recurrence of severe pain in the aforementioned areas.  The pain is severe, not improved at home narcotics.  There is mild new nausea, but no new fever, chest pain, dyspnea. No other new or concerning aspects of her pain.   Past Medical History  Diagnosis Date  . Hypertension   . Gastritis 05/2011    EGD-Dr. Rhea Belton   . Gastric ulcer 05/2011    EGD-Dr. Rhea Belton   . Depression   . Nicotine dependence   . Alcohol dependence     Quit Sept. 2012   . Helicobacter pylori (H. pylori) infection     Hx of     Past Surgical History  Procedure Laterality Date  . Appendectomy    . Cesarean section      Family History  Problem Relation Age of Onset  . Colon cancer Neg Hx     History  Substance Use Topics  . Smoking status: Current Every Day Smoker -- 0.50 packs/day    Types: Cigarettes  . Smokeless tobacco: Never Used     Comment: Counseling to quit smoking given to patient in exam room   . Alcohol Use: No     Comment: Heavy drinker quit  Sept 2012     OB History   Grav Para Term Preterm Abortions TAB SAB Ect Mult Living                  Review of Systems  Constitutional:       Per HPI, otherwise negative  HENT:       Per HPI, otherwise negative  Respiratory:       Per HPI, otherwise negative  Cardiovascular:       Per HPI, otherwise negative  Gastrointestinal: Positive for nausea, vomiting and abdominal pain. Negative for diarrhea and anal  bleeding.  Endocrine:       Negative aside from HPI  Genitourinary:       Neg aside from HPI   Musculoskeletal:       Per HPI, otherwise negative  Skin: Negative.   Neurological: Negative for syncope.    Allergies  Aspirin and Vicodin  Home Medications   Current Outpatient Rx  Name  Route  Sig  Dispense  Refill  . atenolol (TENORMIN) 25 MG tablet      TAKE 1 TABLET (25 MG TOTAL) BY MOUTH DAILY.   30 tablet   1   . citalopram (CELEXA) 20 MG tablet   Oral   Take 20 mg by mouth daily.         Marland Kitchen HYDROcodone-acetaminophen (NORCO/VICODIN) 5-325 MG per tablet   Oral   Take 1 tablet by mouth every 6 (six) hours as needed for pain.   45 tablet   0   . LORazepam (ATIVAN) 0.5 MG tablet   Oral   Take 0.5 mg by mouth every 6 (six) hours as needed. For anxiety         . pantoprazole (PROTONIX)  40 MG tablet   Oral   Take 40 mg by mouth daily.           . promethazine (PHENERGAN) 12.5 MG tablet   Oral   Take 2 tablets (25 mg total) by mouth every 6 (six) hours as needed for nausea.   30 tablet   0   . temazepam (RESTORIL) 15 MG capsule   Oral   Take 15 mg by mouth at bedtime as needed. For sleep         . traMADol (ULTRAM) 50 MG tablet      TAKE 1 TABLET (50 MG TOTAL) BY MOUTH EVERY 6 (SIX) HOURS AS NEEDED FOR PAIN.   90 tablet   1   . traZODone (DESYREL) 50 MG tablet   Oral   Take 100 mg by mouth at bedtime.            BP 148/87  Pulse 67  Temp(Src) 97.7 F (36.5 C) (Oral)  Resp 18  Ht 5\' 4"  (1.626 m)  Wt 144 lb (65.318 kg)  BMI 24.71 kg/m2  SpO2 98%  Physical Exam  Nursing note and vitals reviewed. Constitutional: She is oriented to person, place, and time. She appears well-developed and well-nourished. No distress.  HENT:  Head: Normocephalic and atraumatic.  Eyes: Conjunctivae and EOM are normal.  Cardiovascular: Normal rate and regular rhythm.   Pulmonary/Chest: Effort normal and breath sounds normal. No stridor. No respiratory distress.   Abdominal: She exhibits no distension. There is no hepatosplenomegaly. There is tenderness in the right upper quadrant and epigastric area. There is guarding. There is no rigidity, no rebound and no CVA tenderness.  Musculoskeletal: She exhibits no edema.  Neurological: She is alert and oriented to person, place, and time. No cranial nerve deficit.  Skin: Skin is warm and dry.  Psychiatric: She has a normal mood and affect.    ED Course  Procedures (including critical care time)  Labs Reviewed  CBC WITH DIFFERENTIAL  COMPREHENSIVE METABOLIC PANEL  LIPASE, BLOOD  PROTIME-INR   Nm Hepato W/eject Fract  11/27/2012  *RADIOLOGY REPORT*  Clinical Data:  Abdominal pain  NUCLEAR MEDICINE HEPATOBILIARY IMAGING  Technique:  Sequential images of the abdomen were obtained out to 60 minutes following intravenous administration of radiopharmaceutical.  Radiopharmaceutical:  5.80mCi Tc-64m Choletec  Comparison:  None.  Findings: Gallbladder activity failed to fill during the first hour.  There is normal uptake throughout the liver.  Small bowel activity occurs after 15 minutes.  After the administration of 2.6 mg morphine and 4 mg Zofran, additional imaging demonstrates gallbladder activity after 10 minutes.  IMPRESSION: Cystic and common bile ducts are patent.   Original Report Authenticated By: Jolaine Click, M.D.      No diagnosis found.  Immediately after the initial evaluation I discussed the patient's case with her gastroenterologist.  We discussed the recent studies, including today's HIDA scan.  The scan likely did not allow for adequate time for GB distension and so dysfunction remains a consideration, though cholecystitis seems less likely.  I also discussed the patient's case with our surgeon on call.  Absent e/o acute cholecystitis, she can be re-evaluated tomorrow        MDM  Patient presents with ongoing abdominal pain, nausea.  Notably, the patient has had multiple exams and imaging  of her gastrointestinal tract, including a HIDA scan today.  Following initial evaluation the patient and discussed her care with both gastroenterologist and surgeon.  Absent distress, elevation of her  transaminases, though there is a mild leukocytosis, there is no indication for emergent intervention.  The patient was admitted for further evaluation and management of her abdominal pain, possibly due to biliary dyskinesia.  Gerhard Munch, MD 11/27/12 2350

## 2012-11-27 NOTE — H&P (Signed)
History and Physical  Selena Farrell ZOX:[id number] DOB: May 25, 1961 DOA: 11/27/2012  Referring physician: ER PCP: Pearson Grippe, MD   Chief Complaint: abdominal pain  HPI: Patient is a 52 year old female with past medical history most significant for hypertension, gastric ulcer disease, depression, chronic abdominal pain who has been under workup by Dr. Rhea Belton. Patient was to get a HIDA scan today for evaluation of abdominal pain when she started having acute worsening. Patient was sent to the ER for pain control by IV medications. The patient presents with worsening abdominal pain. She notes a months long history of epigastric and right upper quadrant pain. The pain was described as 10 out of 10, aching in character, worse with food, better with Vicodin or other pain medications, associated with nausea. She has had workup in the past including EGD which revealed gastritis and she completed the course for H. pylori infection in the past. No new fever, chest pain, dyspnea, chills, change in the color of urine, change in bowel movements noted.  Gastroenterology will consult the patient in the morning an ER physician has already talked to Dr. Daphine Deutscher from surgery. Medical admission is requested at this time.   Review of Systems:  15 point review of system was negative except what is noted above.  Past Medical History  Diagnosis Date  . Hypertension   . Gastritis 05/2011    EGD-Dr. Rhea Belton   . Gastric ulcer 05/2011    EGD-Dr. Rhea Belton   . Depression   . Nicotine dependence   . Alcohol dependence     Quit Sept. 2012   . Helicobacter pylori (H. pylori) infection     Hx of     Past Surgical History  Procedure Laterality Date  . Appendectomy    . Cesarean section      Social History:  reports that she has been smoking Cigarettes.  She has been smoking about 0.50 packs per day. She has never used smokeless tobacco. She reports that she does not drink alcohol or use illicit drugs.  Allergies   Allergen Reactions  . Aspirin Other (See Comments)    Reaction unknown  . Vicodin (Hydrocodone-Acetaminophen) Other (See Comments)    Reaction unknown    Family History  Problem Relation Age of Onset  . Colon cancer Neg Hx      Prior to Admission medications   Medication Sig Start Date End Date Taking? Authorizing Provider  atenolol (TENORMIN) 25 MG tablet TAKE 1 TABLET (25 MG TOTAL) BY MOUTH DAILY. 05/31/12  Yes Beverley Fiedler, MD  citalopram (CELEXA) 20 MG tablet Take 20 mg by mouth daily.   Yes Historical Provider, MD  HYDROcodone-acetaminophen (NORCO/VICODIN) 5-325 MG per tablet Take 1 tablet by mouth every 6 (six) hours as needed for pain. 11/20/12  Yes Beverley Fiedler, MD  LORazepam (ATIVAN) 0.5 MG tablet Take 0.5 mg by mouth every 6 (six) hours as needed. For anxiety   Yes Historical Provider, MD  pantoprazole (PROTONIX) 40 MG tablet Take 40 mg by mouth daily.     Yes Historical Provider, MD  promethazine (PHENERGAN) 12.5 MG tablet Take 2 tablets (25 mg total) by mouth every 6 (six) hours as needed for nausea. 11/20/12  Yes Beverley Fiedler, MD  temazepam (RESTORIL) 15 MG capsule Take 15 mg by mouth at bedtime as needed. For sleep   Yes Historical Provider, MD  traMADol (ULTRAM) 50 MG tablet TAKE 1 TABLET (50 MG TOTAL) BY MOUTH EVERY 6 (SIX) HOURS AS NEEDED FOR PAIN.  06/28/12  Yes Beverley Fiedler, MD  traZODone (DESYREL) 50 MG tablet Take 100 mg by mouth at bedtime.  03/31/12  Yes Beverley Fiedler, MD   Physical Exam: Filed Vitals:   11/27/12 2011  BP: 148/87  Pulse: 67  Temp: 97.7 F (36.5 C)  TempSrc: Oral  Resp: 18  Height: 5\' 4"  (1.626 m)  Weight: 144 lb (65.318 kg)  SpO2: 98%   Physical Exam: General: Vital signs reviewed and noted. Well-developed, well-nourished, in no acute distress; alert, appropriate and cooperative throughout examination.  Head: Normocephalic, atraumatic.  Eyes: PERRL, EOMI, No signs of anemia or jaundince.  Nose: Mucous membranes moist, not inflammed,  nonerythematous.  Throat: Oropharynx nonerythematous, no exudate appreciated.   Neck: No deformities, masses, or tenderness noted.Supple, No carotid Bruits, no JVD.  Lungs:  Normal respiratory effort. Clear to auscultation BL without crackles or wheezes.  Heart: RRR. S1 and S2 normal without gallop, murmur, or rubs.  Abdomen:   normoactive bowel sounds, extremely tender to touch on superficial palpation in the right upper quadrant region, no Murphy's sign noted, soft abdomen otherwise no guarding or rigidity noted   Extremities: No pretibial edema.  Neurologic: A&O X3, CN II - XII are grossly intact. Motor strength is 5/5 in the all 4 extremities, Sensations intact to light touch, Cerebellar signs negative.  Skin: No visible rashes, scars.     Wt Readings from Last 3 Encounters:  11/27/12 144 lb (65.318 kg)  11/20/12 145 lb (65.772 kg)  07/07/11 133 lb 6.4 oz (60.51 kg)    Labs on Admission:  Basic Metabolic Panel:  Recent Labs Lab 11/23/12 1732 11/27/12 2055  NA 139 136  K 3.9 3.4*  CL 102 98  CO2  --  27  GLUCOSE 88 74  BUN 6 7  CREATININE 1.00 0.82  CALCIUM  --  9.2    Liver Function Tests:  Recent Labs Lab 11/27/12 2055  AST 29  ALT 29  ALKPHOS 76  BILITOT 0.3  PROT 7.0  ALBUMIN 3.8    Recent Labs Lab 11/27/12 2055  LIPASE 21   CBC:  Recent Labs Lab 11/23/12 1732 11/27/12 2055  WBC  --  13.7*  NEUTROABS  --  7.5  HGB 14.3 13.9  HCT 42.0 40.6  MCV  --  87.1  PLT  --  355    Radiological Exams on Admission: Nm Hepato W/eject Fract  11/27/2012  *RADIOLOGY REPORT*  Clinical Data:  Abdominal pain  NUCLEAR MEDICINE HEPATOBILIARY IMAGING  Technique:  Sequential images of the abdomen were obtained out to 60 minutes following intravenous administration of radiopharmaceutical.  Radiopharmaceutical:  5.71mCi Tc-18m Choletec  Comparison:  None.  Findings: Gallbladder activity failed to fill during the first hour.  There is normal uptake throughout the  liver.  Small bowel activity occurs after 15 minutes.  After the administration of 2.6 mg morphine and 4 mg Zofran, additional imaging demonstrates gallbladder activity after 10 minutes.  IMPRESSION: Cystic and common bile ducts are patent.   Original Report Authenticated By: Jolaine Click, M.D.      Principal Problem:   Abdominal pain Active Problems:   Helicobacter positive gastritis   PUD (peptic ulcer disease)   Sphincter of Oddi dysfunction   Assessment/Plan  Patient is a 52 year old female with past medical history most significant for multiple gastroenterology issues in the past including peptic ulcer disease, sphincter afford her dysfunction status post stenting, is bilaterally related to peptic ulcer disease treated with antibiotics, abdominal pain  which has been going on for last several months now and was evaluated by Dr. Rhea Belton with MRCP which did not reveal any acute pathology and HIDA scan the results of which suggests that patient was unable to fill the gallbladder which raises concern for cystic duct obstruction.  Abdominal pain: Most likely differential at this time is cholecystitis(subacute) -IV antibiotics not indicated at this time -Surgical consult obtained for possible cholecystectomy -Gastroenterology to see the patient in the morning -Patient to be put n.p.o. After midnight except meds and icechips -clear liquids for now -IV fluids -Pain control with IV Dilaudid  Restart home medications.  Heparin for DVT.   Code Status: Full code Family Communication: Family updated at bedside Disposition Plan/Anticipated LOS: Guarded  Time spent: 75 minutes  Lars Mage, MD  [facility] Team 5  If 7PM-7AM, please contact night-coverage at [email or web address], password [facility] 11/27/2012, 11:50 PM

## 2012-11-27 NOTE — ED Notes (Signed)
Pt had a tests done today on her gall bladder and came home but was in so much pain she had to come in. Dr. Rhea Belton called pt and told her to come in and that he could be called. Pt has been dealing with this for several months. N/V/D.

## 2012-11-27 NOTE — Telephone Encounter (Signed)
Message copied by Florene Glen on Tue Nov 27, 2012  4:44 PM ------      Message from: Beverley Fiedler      Created: Tue Nov 27, 2012  4:08 PM       Gallbladder did not fill normally and she required morphine to aid in filling.  It did empty and the bile duct is patent (we already knew this about the bile ducts)      Unable to calculate EF of gallbladder after giving morphine      Based on her symptoms and this test, I would refer to surgery for their opinion regarding cholecystectomy.       ------

## 2012-11-28 ENCOUNTER — Encounter (HOSPITAL_COMMUNITY): Payer: Self-pay | Admitting: *Deleted

## 2012-11-28 ENCOUNTER — Telehealth (INDEPENDENT_AMBULATORY_CARE_PROVIDER_SITE_OTHER): Payer: Self-pay

## 2012-11-28 ENCOUNTER — Encounter (HOSPITAL_COMMUNITY)
Admission: EM | Disposition: A | Discharge status: Home / Self Care | Payer: Self-pay | Source: Home / Self Care | Attending: Internal Medicine

## 2012-11-28 DIAGNOSIS — K279 Peptic ulcer, site unspecified, unspecified as acute or chronic, without hemorrhage or perforation: Secondary | ICD-10-CM

## 2012-11-28 DIAGNOSIS — R109 Unspecified abdominal pain: Secondary | ICD-10-CM

## 2012-11-28 DIAGNOSIS — A048 Other specified bacterial intestinal infections: Secondary | ICD-10-CM

## 2012-11-28 DIAGNOSIS — R1013 Epigastric pain: Secondary | ICD-10-CM

## 2012-11-28 DIAGNOSIS — E876 Hypokalemia: Secondary | ICD-10-CM

## 2012-11-28 DIAGNOSIS — K828 Other specified diseases of gallbladder: Secondary | ICD-10-CM

## 2012-11-28 DIAGNOSIS — D72829 Elevated white blood cell count, unspecified: Secondary | ICD-10-CM

## 2012-11-28 DIAGNOSIS — K811 Chronic cholecystitis: Secondary | ICD-10-CM

## 2012-11-28 HISTORY — PX: ESOPHAGOGASTRODUODENOSCOPY: SHX5428

## 2012-11-28 LAB — CBC WITH DIFFERENTIAL/PLATELET
HCT: 35.3 % — ABNORMAL LOW (ref 36.0–46.0)
Hemoglobin: 11.8 g/dL — ABNORMAL LOW (ref 12.0–15.0)
Lymphocytes Relative: 40 % (ref 12–46)
Lymphs Abs: 4.6 10*3/uL — ABNORMAL HIGH (ref 0.7–4.0)
MCHC: 33.4 g/dL (ref 30.0–36.0)
Monocytes Absolute: 0.7 10*3/uL (ref 0.1–1.0)
Monocytes Relative: 6 % (ref 3–12)
Neutro Abs: 5.9 10*3/uL (ref 1.7–7.7)
Neutrophils Relative %: 52 % (ref 43–77)
RBC: 4 MIL/uL (ref 3.87–5.11)
WBC: 11.4 10*3/uL — ABNORMAL HIGH (ref 4.0–10.5)

## 2012-11-28 LAB — BASIC METABOLIC PANEL
BUN: 6 mg/dL (ref 6–23)
CO2: 26 mEq/L (ref 19–32)
Chloride: 102 mEq/L (ref 96–112)
Creatinine, Ser: 0.83 mg/dL (ref 0.50–1.10)
Glucose, Bld: 89 mg/dL (ref 70–99)
Potassium: 3.7 mEq/L (ref 3.5–5.1)

## 2012-11-28 SURGERY — EGD (ESOPHAGOGASTRODUODENOSCOPY)
Anesthesia: Moderate Sedation

## 2012-11-28 MED ORDER — ONDANSETRON HCL 4 MG/2ML IJ SOLN
4.0000 mg | Freq: Four times a day (QID) | INTRAMUSCULAR | Status: DC | PRN
  Administered 2012-11-28 – 2012-11-30 (×5): 4 mg via INTRAVENOUS
  Filled 2012-11-28 (×6): qty 2

## 2012-11-28 MED ORDER — FENTANYL CITRATE 0.05 MG/ML IJ SOLN
INTRAMUSCULAR | Status: DC | PRN
  Administered 2012-11-28 (×3): 25 ug via INTRAVENOUS

## 2012-11-28 MED ORDER — LORAZEPAM 0.5 MG PO TABS: 0.5000 mg | ORAL_TABLET | Freq: Four times a day (QID) | ORAL | Status: DC | PRN

## 2012-11-28 MED ORDER — TRAZODONE HCL 100 MG PO TABS
100.0000 mg | ORAL_TABLET | Freq: Every day | ORAL | Status: DC
  Administered 2012-11-28 – 2012-11-29 (×3): 100 mg via ORAL
  Filled 2012-11-28 (×4): qty 1

## 2012-11-28 MED ORDER — FENTANYL CITRATE 0.05 MG/ML IJ SOLN
INTRAMUSCULAR | Status: AC
  Filled 2012-11-28: qty 2

## 2012-11-28 MED ORDER — TEMAZEPAM 15 MG PO CAPS
15.0000 mg | ORAL_CAPSULE | Freq: Every evening | ORAL | Status: DC | PRN
  Administered 2012-11-28 – 2012-11-29 (×3): 15 mg via ORAL
  Filled 2012-11-28 (×3): qty 1

## 2012-11-28 MED ORDER — HYDROMORPHONE HCL PF 1 MG/ML IJ SOLN
1.0000 mg | Freq: Once | INTRAMUSCULAR | Status: AC
  Administered 2012-11-28: 1 mg via INTRAVENOUS
  Filled 2012-11-28: qty 1

## 2012-11-28 MED ORDER — HEPARIN SODIUM (PORCINE) 5000 UNIT/ML IJ SOLN
5000.0000 [IU] | Freq: Three times a day (TID) | INTRAMUSCULAR | Status: DC
  Administered 2012-11-28 – 2012-11-30 (×4): 5000 [IU] via SUBCUTANEOUS
  Filled 2012-11-28 (×10): qty 1

## 2012-11-28 MED ORDER — HYDROMORPHONE HCL PF 1 MG/ML IJ SOLN
1.0000 mg | INTRAMUSCULAR | Status: DC | PRN
  Administered 2012-11-28: 1 mg via INTRAVENOUS
  Filled 2012-11-28: qty 1

## 2012-11-28 MED ORDER — PROMETHAZINE HCL 25 MG PO TABS
12.5000 mg | ORAL_TABLET | Freq: Four times a day (QID) | ORAL | Status: DC | PRN
  Administered 2012-11-28 (×3): 12.5 mg via ORAL
  Filled 2012-11-28 (×3): qty 1

## 2012-11-28 MED ORDER — ATENOLOL 50 MG PO TABS
50.0000 mg | ORAL_TABLET | Freq: Every day | ORAL | Status: DC
  Filled 2012-11-28 (×2): qty 1

## 2012-11-28 MED ORDER — CITALOPRAM HYDROBROMIDE 20 MG PO TABS
20.0000 mg | ORAL_TABLET | Freq: Every day | ORAL | Status: DC
Start: 2012-11-28 — End: 2012-11-30
  Administered 2012-11-28 – 2012-11-30 (×3): 20 mg via ORAL
  Filled 2012-11-28 (×3): qty 1

## 2012-11-28 MED ORDER — ATENOLOL 25 MG PO TABS
25.0000 mg | ORAL_TABLET | Freq: Every day | ORAL | Status: DC
  Administered 2012-11-28: 25 mg via ORAL
  Filled 2012-11-28: qty 1

## 2012-11-28 MED ORDER — BUTAMBEN-TETRACAINE-BENZOCAINE 2-2-14 % EX AERO
INHALATION_SPRAY | CUTANEOUS | Status: DC | PRN
  Administered 2012-11-28: 2 via TOPICAL

## 2012-11-28 MED ORDER — MIDAZOLAM HCL 10 MG/2ML IJ SOLN
INTRAMUSCULAR | Status: AC
  Filled 2012-11-28: qty 2

## 2012-11-28 MED ORDER — PANTOPRAZOLE SODIUM 40 MG PO TBEC
40.0000 mg | DELAYED_RELEASE_TABLET | Freq: Every day | ORAL | Status: DC
  Administered 2012-11-28 – 2012-11-30 (×2): 40 mg via ORAL
  Filled 2012-11-28 (×3): qty 1

## 2012-11-28 MED ORDER — MORPHINE SULFATE 2 MG/ML IJ SOLN
1.0000 mg | INTRAMUSCULAR | Status: DC | PRN
  Administered 2012-11-28 – 2012-11-30 (×13): 2 mg via INTRAVENOUS
  Filled 2012-11-28 (×14): qty 1

## 2012-11-28 MED ORDER — HYDROMORPHONE HCL PF 1 MG/ML IJ SOLN
INTRAMUSCULAR | Status: AC
  Filled 2012-11-28: qty 1

## 2012-11-28 MED ORDER — SODIUM CHLORIDE 0.9 % IV SOLN
INTRAVENOUS | Status: DC
  Administered 2012-11-28: 20 mL/h via INTRAVENOUS

## 2012-11-28 MED ORDER — HYDROMORPHONE HCL PF 1 MG/ML IJ SOLN
1.0000 mg | INTRAMUSCULAR | Status: DC | PRN
  Administered 2012-11-28 (×3): 1 mg via INTRAVENOUS
  Filled 2012-11-28 (×2): qty 1

## 2012-11-28 MED ORDER — SODIUM CHLORIDE 0.9 % IV SOLN
INTRAVENOUS | Status: DC
  Administered 2012-11-28 – 2012-11-30 (×4): via INTRAVENOUS

## 2012-11-28 MED ORDER — MIDAZOLAM HCL 10 MG/2ML IJ SOLN
INTRAMUSCULAR | Status: DC | PRN
  Administered 2012-11-28 (×3): 2 mg via INTRAVENOUS

## 2012-11-28 MED ORDER — SODIUM CHLORIDE 0.9 % IV SOLN: INTRAVENOUS | Status: AC

## 2012-11-28 MED ORDER — ACETAMINOPHEN 325 MG PO TABS: 650.0000 mg | ORAL_TABLET | Freq: Four times a day (QID) | ORAL | Status: DC | PRN

## 2012-11-28 NOTE — Consult Note (Signed)
General Surgery [facility]  Reason for Consult:  Abdominal pain, rule out biliary dyskinesia  Referring Physician:   Dr. Erick Blinks, [facility]  Selena Farrell is an 52 y.o. female.  HPI: patient is a 52 yo WF with about a two year history of RUQ abdominal pain, nausea, pain radiating into back and shoulders.  Extensive work up documented in the medical record.  Underwent ERCP with sphincterotomy and stent placement in 2012.  Persistent pain the past few weeks is now unrelenting.  Admitted for evaluation of abdomina pain.  Studies include USN, MRI, and EGD.  EGD with mild gastritis and duodenitis.  Previous abdominal surgery includes C-section and open appendectomy for perforated appendicitis.  Past Medical History  Diagnosis Date  . Hypertension   . Gastritis 05/2011    EGD-Dr. Rhea Belton   . Gastric ulcer 05/2011    EGD-Dr. Rhea Belton   . Depression   . Nicotine dependence   . Alcohol dependence     Quit Sept. 2012   . Helicobacter pylori (H. pylori) infection     Hx of     Past Surgical History  Procedure Laterality Date  . Appendectomy    . Cesarean section      Family History  Problem Relation Age of Onset  . Colon cancer Neg Hx     Social History:  reports that she has been smoking Cigarettes.  She has been smoking about 0.50 packs per day. She has never used smokeless tobacco. She reports that she does not drink alcohol or use illicit drugs.  Allergies:  Allergies  Allergen Reactions  . Aspirin Other (See Comments)    Pt has bleeding ulcer, not allowed to take aspirin  . Vicodin (Hydrocodone-Acetaminophen) Nausea And Vomiting    Medications: I have reviewed the patient's current medications.  Results for orders placed during the hospital encounter of 11/27/12 (from the past 48 hour(s))  CBC WITH DIFFERENTIAL     Status: Abnormal   Collection Time    11/27/12  8:55 PM      Result Value Range   WBC 13.7 (*) 4.0 - 10.5 K/uL   RBC 4.66  3.87 -  5.11 MIL/uL   Hemoglobin 13.9  12.0 - 15.0 g/dL   HCT 16.1  09.6 - 04.5 %   MCV 87.1  78.0 - 100.0 fL   MCH 29.8  26.0 - 34.0 pg   MCHC 34.2  30.0 - 36.0 g/dL   RDW 40.9  81.1 - 91.4 %   Platelets 355  150 - 400 K/uL   Neutrophils Relative 55  43 - 77 %   Neutro Abs 7.5  1.7 - 7.7 K/uL   Lymphocytes Relative 37  12 - 46 %   Lymphs Abs 5.1 (*) 0.7 - 4.0 K/uL   Monocytes Relative 7  3 - 12 %   Monocytes Absolute 0.9  0.1 - 1.0 K/uL   Eosinophils Relative 1  0 - 5 %   Eosinophils Absolute 0.2  0.0 - 0.7 K/uL   Basophils Relative 0  0 - 1 %   Basophils Absolute 0.0  0.0 - 0.1 K/uL  COMPREHENSIVE METABOLIC PANEL     Status: Abnormal   Collection Time    11/27/12  8:55 PM      Result Value Range   Sodium 136  135 - 145 mEq/L   Potassium 3.4 (*) 3.5 - 5.1 mEq/L   Chloride 98  96 - 112 mEq/L   CO2  27  19 - 32 mEq/L   Glucose, Bld 74  70 - 99 mg/dL   BUN 7  6 - 23 mg/dL   Creatinine, Ser 4.54  0.50 - 1.10 mg/dL   Calcium 9.2  8.4 - 09.8 mg/dL   Total Protein 7.0  6.0 - 8.3 g/dL   Albumin 3.8  3.5 - 5.2 g/dL   AST 29  0 - 37 U/L   ALT 29  0 - 35 U/L   Alkaline Phosphatase 76  39 - 117 U/L   Total Bilirubin 0.3  0.3 - 1.2 mg/dL   GFR calc non Af Amer 81 (*) >90 mL/min   GFR calc Af Amer >90  >90 mL/min   Comment:            The eGFR has been calculated     using the CKD EPI equation.     This calculation has not been     validated in all clinical     situations.     eGFR's persistently     <90 mL/min signify     possible Chronic Kidney Disease.  LIPASE, BLOOD     Status: None   Collection Time    11/27/12  8:55 PM      Result Value Range   Lipase 21  11 - 59 U/L  PROTIME-INR     Status: None   Collection Time    11/27/12  8:55 PM      Result Value Range   Prothrombin Time 12.8  11.6 - 15.2 seconds   INR 0.97  0.00 - 1.49  CBC WITH DIFFERENTIAL     Status: Abnormal   Collection Time    11/28/12  4:00 AM      Result Value Range   WBC 11.4 (*) 4.0 - 10.5 K/uL   RBC  4.00  3.87 - 5.11 MIL/uL   Hemoglobin 11.8 (*) 12.0 - 15.0 g/dL   HCT 11.9 (*) 14.7 - 82.9 %   MCV 88.3  78.0 - 100.0 fL   MCH 29.5  26.0 - 34.0 pg   MCHC 33.4  30.0 - 36.0 g/dL   RDW 56.2  13.0 - 86.5 %   Platelets 277  150 - 400 K/uL   Neutrophils Relative 52  43 - 77 %   Neutro Abs 5.9  1.7 - 7.7 K/uL   Lymphocytes Relative 40  12 - 46 %   Lymphs Abs 4.6 (*) 0.7 - 4.0 K/uL   Monocytes Relative 6  3 - 12 %   Monocytes Absolute 0.7  0.1 - 1.0 K/uL   Eosinophils Relative 2  0 - 5 %   Eosinophils Absolute 0.2  0.0 - 0.7 K/uL   Basophils Relative 0  0 - 1 %   Basophils Absolute 0.0  0.0 - 0.1 K/uL  BASIC METABOLIC PANEL     Status: Abnormal   Collection Time    11/28/12  4:00 AM      Result Value Range   Sodium 135  135 - 145 mEq/L   Potassium 3.7  3.5 - 5.1 mEq/L   Chloride 102  96 - 112 mEq/L   CO2 26  19 - 32 mEq/L   Glucose, Bld 89  70 - 99 mg/dL   BUN 6  6 - 23 mg/dL   Creatinine, Ser 7.84  0.50 - 1.10 mg/dL   Calcium 8.3 (*) 8.4 - 10.5 mg/dL   GFR calc non Af Amer 80 (*) >90 mL/min  GFR calc Af Amer >90  >90 mL/min   Comment:            The eGFR has been calculated     using the CKD EPI equation.     This calculation has not been     validated in all clinical     situations.     eGFR's persistently     <90 mL/min signify     possible Chronic Kidney Disease.  MRSA PCR SCREENING     Status: None   Collection Time    11/28/12  5:17 AM      Result Value Range   MRSA by PCR NEGATIVE  NEGATIVE   Comment:            The GeneXpert MRSA Assay (FDA     approved for NASAL specimens     only), is one component of a     comprehensive MRSA colonization     surveillance program. It is not     intended to diagnose MRSA     infection nor to guide or     monitor treatment for     MRSA infections.    Nm Hepato W/eject Fract  11/27/2012  *RADIOLOGY REPORT*  Clinical Data:  Abdominal pain  NUCLEAR MEDICINE HEPATOBILIARY IMAGING  Technique:  Sequential images of the abdomen  were obtained out to 60 minutes following intravenous administration of radiopharmaceutical.  Radiopharmaceutical:  5.64mCi Tc-26m Choletec  Comparison:  None.  Findings: Gallbladder activity failed to fill during the first hour.  There is normal uptake throughout the liver.  Small bowel activity occurs after 15 minutes.  After the administration of 2.6 mg morphine and 4 mg Zofran, additional imaging demonstrates gallbladder activity after 10 minutes.  IMPRESSION: Cystic and common bile ducts are patent.   Original Report Authenticated By: Jolaine Click, M.D.     Review of Systems  Constitutional: Negative.   HENT: Positive for neck pain.   Eyes: Negative.   Respiratory: Negative.   Cardiovascular: Negative.   Gastrointestinal: Positive for nausea, vomiting and abdominal pain. Negative for heartburn, diarrhea, constipation, blood in stool and melena.  Genitourinary: Negative.   Musculoskeletal: Positive for back pain.  Skin: Negative.   Neurological: Negative.   Endo/Heme/Allergies: Negative.   Psychiatric/Behavioral: Negative.    Blood pressure 116/63, pulse 60, temperature 97.7 F (36.5 C), temperature source Oral, resp. rate 16, height 5\' 4"  (1.626 m), weight 145 lb 3.2 oz (65.862 kg), SpO2 97.00%. Physical Exam  Constitutional: She is oriented to person, place, and time. She appears well-developed and well-nourished. No distress.  HENT:  Head: Normocephalic and atraumatic.  Right Ear: External ear normal.  Left Ear: External ear normal.  Mouth/Throat: Oropharynx is clear and moist.  Eyes: Conjunctivae are normal. Pupils are equal, round, and reactive to light. No scleral icterus.  Neck: Normal range of motion. Neck supple. No tracheal deviation present. No thyromegaly present.  Cardiovascular: Normal rate, regular rhythm and normal heart sounds.   No murmur heard. Respiratory: Effort normal and breath sounds normal. She has no wheezes.  GI: Soft. Bowel sounds are normal. She exhibits  no distension and no mass. There is tenderness (RUQ). There is guarding (RUQ). There is no rebound.  Musculoskeletal: Normal range of motion. She exhibits no edema.  Lymphadenopathy:    She has no cervical adenopathy.  Neurological: She is alert and oriented to person, place, and time.  Skin: Skin is warm and dry.  Psychiatric: She has a normal mood and  affect. Her behavior is normal.    Assessment/Plan: 1.  RUQ abdominal pain / nausea / suspected biliary dyskinesia  Discussed option of laparoscopic cholecystectomy with patient and her sister at length.  Strong family history of gallbladder disease.  I explained the risks and benefits of the procedure.  I discussed the risk of conversion to open surgery.  I explained that there is only about a 50/50 chance of significant symptom improvement.  Patient and her family understand and wish to proceed.  The risks and benefits of the procedure have been discussed at length with the patient.  The patient understands the proposed procedure, potential alternative treatments, and the course of recovery to be expected.  All of the patient's questions have been answered at this time.  The patient wishes to proceed with surgery.  Velora Heckler, MD, [facility] Office: [phone]    Jojo Geving Judie Petit 11/28/2012, 2:26 PM

## 2012-11-28 NOTE — Op Note (Signed)
[facility] [address] [place], [address]   ENDOSCOPY PROCEDURE REPORT  PATIENT: Selena Farrell, Selena Farrell  MR#: [id number] BIRTHDATE: [birth year] , 51  yrs. old GENDER: Female ENDOSCOPIST: Beverley Fiedler, MD REFERRED BY: PROCEDURE DATE:  11/28/2012 PROCEDURE:  EGD w/ biopsy for H.pylori ASA CLASS:     Class II INDICATIONS:  abdominal pain in the upper right quadrant. MEDICATIONS: These medications were titrated to patient response per physician's verbal order, Fentanyl 75 mcg IV, and Versed 6 mg IV TOPICAL ANESTHETIC: Cetacaine Spray  DESCRIPTION OF PROCEDURE: After the risks benefits and alternatives of the procedure were thoroughly explained, informed consent was obtained.  The Pentax Gastroscope [id number] endoscope was introduced through the mouth and advanced to the second portion of the duodenum. Without limitations.  The instrument was slowly withdrawn as the mucosa was fully examined.   ESOPHAGUS: The mucosa of the esophagus appeared normal.   A variable Z-line was observed 37 cm from the incisors.  STOMACH: There was mild antral gastropathy noted.  Cold forcep biopsies were taken at the antrum and angularis.   The stomach otherwise appeared normal.  DUODENUM: The duodenal mucosa showed no abnormalities in the bulb and second portion of the duodenum.  Retroflexed views revealed no abnormalities.     The scope was then withdrawn from the patient and the procedure completed.  COMPLICATIONS: There were no complications. ENDOSCOPIC IMPRESSION: 1.   The mucosa of the esophagus appeared normal 2.   Variable Z-line was observed 37 cm from the incisors 3.   There was mild antral gastropathy noted; biopsies taken given history of H Pylori 4.   The stomach otherwise appeared normal 5.   The duodenal mucosa showed no abnormalities in the bulb and second portion of the duodenum  RECOMMENDATIONS: 1.  Await pathology results 2.  Follow-up of helicobacter pylori  status, treat if indicated 3.  Continue daily PPI 4.  Surgical consult today  eSigned:  Beverley Fiedler, MD 11/28/2012 11:56 AM       CC:The Patient

## 2012-11-28 NOTE — Consult Note (Addendum)
Patient seen, examined, and I agree with the above documentation, including the assessment and plan. Overall I think biliary dyskinesia/dysfunction vs subacute cholecystitis is more likely the cause of her painbeen peptic ulcer disease, but given her history of the latter we will perform upper endoscopy this morning. I discussed the test with the patient including the risks and benefits and she is agreeable to proceed. 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 LFTs remain normal. Recent MRI/MRCP stable from ductal standpt If EGD negative, we'll await surgical evaluation

## 2012-11-28 NOTE — Progress Notes (Signed)
Clinical Social Work Department BRIEF PSYCHOSOCIAL ASSESSMENT 11/28/2012  Patient:  Selena Farrell, Selena Farrell     Account Number:  [id number]     Admit date:  11/27/2012  Clinical Social Worker:  Jacelyn Grip  Date/Time:  11/28/2012 03:00 PM  Referred by:  Physician  Date Referred:  11/28/2012 Referred for  Advanced Directives   Other Referral:   Interview type:  Patient Other interview type:    PSYCHOSOCIAL DATA Living Status:  ALONE Admitted from facility:   Level of care:   Primary support name:  Emelia Salisbury Primary support relationship to patient:  SIBLING Degree of support available:   strong    CURRENT CONCERNS Current Concerns  Other - See comment   Other Concerns:   Advanced Directives    SOCIAL WORK ASSESSMENT / PLAN CSW received referral for Advanced Directives.    CSW received notification that pastoral care was able to assist with discussing Advanced Directive packet. CSW appreciates this assistance.    CSW assisted wtih arranging notary to notarize document.    Pt and pt sister appreciative of assistance.    No further social work needs identified at this time.    CSW signing off.    Please re-consult if further social work needs arise.   Assessment/plan status:  No Further Intervention Required Other assessment/ plan:   Information/referral to community resources:   Biochemist, clinical    PATIENT'S/FAMILY'S RESPONSE TO PLAN OF CARE: Pt alert and oriented x 4. Pt appreciative of assistance of completion of Advanced Directives packet.    Please re-consult if further social work needs arise.     Jacklynn Lewis, MSW, LCSWA  Clinical Social Work [phone]

## 2012-11-28 NOTE — Progress Notes (Signed)
Assisted patient and sister, Okey Regal, in filling out Advanced Directive form. Referred signing to [facility] for notary and witnesses. Patient has indicated that her sister is her POA and has marked her Living Will desires.

## 2012-11-28 NOTE — Progress Notes (Addendum)
[facility] PROGRESS NOTE  Selena Farrell ZOX:[id number] DOB: 03/19/1961 DOA: 11/27/2012 PCP: Pearson Grippe, MD  Brief narrative: 52 year old female with past medical history of hypertension, peptic ulcer disease (history of positive H.Pylori in past) who presented 11/27/12 to [facility] ED with worsening epigastric pain started on the day of admission. Patient had HIDA scan done 11/27/12 with no significant findings related to cystic or common bile duct. Patient had EGD done 11/28/12 with essentially normal esophageal mucosa and mild antral gastropathy. Surgery consulted for possible cholecystectomy.  Assessment/Plan:  Principal Problem:   Abdominal pain  Status post EGD 11/28/2012 with normal esophageal mucosa and mild antral gastropathy. Will follow up on H.Pylori biopsy result  Surgery consult appreciated  Continue IV fluids  Continue protonix 40 mg daily  Pain management with dilaudid 1mg  IV every 3 hours PRN Active Problems: Hypokalemia  Secondary to GI losses  Potassium now WNL Hypertension  Increase atenolol to 50 mg a day (from 25 mg a day)  BP 177/87 Depression  Continue citalopram  Code Status: home when stable Family Communication: at bedside Disposition Plan: home when stable  Manson Passey, MD  [facility] Pager [phone]  If 7PM-7AM, please contact night-coverage [email or web address] Password [email or web address] 11/28/2012, 7:06 AM   LOS: 1 day   Consultants:  Surgery  Gastroenterology  Procedures:  None   Antibiotics:  None   HPI/Subjective: No acute overnight events.  Objective: Filed Vitals:   11/27/12 2011 11/28/12 0101 11/28/12 0556  BP: 148/87 139/72 106/55  Pulse: 67 60 63  Temp: 97.7 F (36.5 C) 97.7 F (36.5 C) 98 F (36.7 C)  TempSrc: Oral Oral Oral  Resp: 18 18 16   Height: 5\' 4"  (1.626 m) 5\' 4"  (1.626 m)   Weight: 65.318 kg (144 lb) 65.862 kg (145 lb 3.2 oz)   SpO2: 98% 97% 95%    Intake/Output Summary (Last 24 hours) at 11/28/12 0706 Last data  filed at 11/28/12 0507  Gross per 24 hour  Intake    442 ml  Output      0 ml  Net    442 ml    Exam:   General:  Pt is alert, follows commands appropriately, not in acute distress  Cardiovascular: Regular rate and rhythm, S1/S2, no murmurs, no rubs, no gallops  Respiratory: Clear to auscultation bilaterally, no wheezing, no crackles, no rhonchi  Abdomen: Soft, tender in upper abdomen, non distended, bowel sounds present, no guarding  Extremities: No edema, pulses DP and PT palpable bilaterally  Neuro: Grossly nonfocal  Data Reviewed: Basic Metabolic Panel:  Recent Labs Lab 11/23/12 1732 11/27/12 2055 11/28/12 0400  NA 139 136 135  K 3.9 3.4* 3.7  CL 102 98 102  CO2  --  27 26  GLUCOSE 88 74 89  BUN 6 7 6   CREATININE 1.00 0.82 0.83  CALCIUM  --  9.2 8.3*   Liver Function Tests:  Recent Labs Lab 11/27/12 2055  AST 29  ALT 29  ALKPHOS 76  BILITOT 0.3  PROT 7.0  ALBUMIN 3.8    Recent Labs Lab 11/27/12 2055  LIPASE 21   No results found for this basename: AMMONIA,  in the last 168 hours CBC:  Recent Labs Lab 11/23/12 1732 11/27/12 2055 11/28/12 0400  WBC  --  13.7* 11.4*  NEUTROABS  --  7.5 5.9  HGB 14.3 13.9 11.8*  HCT 42.0 40.6 35.3*  MCV  --  87.1 88.3  PLT  --  355 277   Cardiac Enzymes: No  results found for this basename: CKTOTAL, CKMB, CKMBINDEX, TROPONINI,  in the last 168 hours BNP: No components found with this basename: POCBNP,  CBG: No results found for this basename: GLUCAP,  in the last 168 hours  Recent Results (from the past 240 hour(s))  MRSA PCR SCREENING     Status: None   Collection Time    11/28/12  5:17 AM      Result Value Range Status   MRSA by PCR NEGATIVE  NEGATIVE Final   Comment:            The GeneXpert MRSA Assay (FDA     approved for NASAL specimens     only), is one component of a     comprehensive MRSA colonization     surveillance program. It is not     intended to diagnose MRSA     infection nor  to guide or     monitor treatment for     MRSA infections.     Studies: Nm Hepato W/eject Fract  11/27/2012  *RADIOLOGY REPORT*  Clinical Data:  Abdominal pain  NUCLEAR MEDICINE HEPATOBILIARY IMAGING  Technique:  Sequential images of the abdomen were obtained out to 60 minutes following intravenous administration of radiopharmaceutical.  Radiopharmaceutical:  5.40mCi Tc-5m Choletec  Comparison:  None.  Findings: Gallbladder activity failed to fill during the first hour.  There is normal uptake throughout the liver.  Small bowel activity occurs after 15 minutes.  After the administration of 2.6 mg morphine and 4 mg Zofran, additional imaging demonstrates gallbladder activity after 10 minutes.  IMPRESSION: Cystic and common bile ducts are patent.   Original Report Authenticated By: Jolaine Click, M.D.     Scheduled Meds: . sodium chloride   Intravenous STAT  . atenolol  25 mg Oral Daily  . citalopram  20 mg Oral Daily  . heparin  5,000 Units Subcutaneous Q8H  . pantoprazole  40 mg Oral Daily  . traZODone  100 mg Oral QHS   Continuous Infusions: . sodium chloride 100 mL/hr at 11/28/12 0507

## 2012-11-28 NOTE — Progress Notes (Signed)
RN Geraldine Solar called chaplain because patient desired to make her sister, Okey Regal, the healthcare power of attorney. Arrived with paperwork, but staff came to take her for her procedure. We will finish the paperwork when she is able and competent to sign it.

## 2012-11-28 NOTE — Consult Note (Signed)
Referring Provider: No ref. provider found Primary Care Physician:  Pearson Grippe, MD Primary Gastroenterologist:  Dr. Rhea Belton  Reason for Consultation:  Abdominal pain  HPI: Selena Farrell is a 52 y.o. female with past medical history most significant for hypertension, gastric ulcer disease, depression, chronic abdominal pain who has been under workup by Dr. Rhea Belton. Patient had HIDA scan yesterday for evaluation of abdominal pain at which time she started having acute worsening of her pain. Patient was sent to the ER for pain control by IV medications. HIDA scan was not exactly normal (results listed below).  The patient presented to the ED with worsening abdominal pain. She notes a months long history of epigastric and  Mostly right upper quadrant pain. The pain was described as 10 out of 10, aching in character, worse with food, better with Vicodin or other pain medications, associated with nausea.  No vomiting.  She has had workup in the past including EGD, which revealed gastritis and gastric ulcers; also had Hpylori, and she completed the course for H. pylori treatment in the past.  Labs are normal, including LFT's (had a mild elevation in WBC count to 13,000 on admission).  Surgery has been asked to see the patient as well.    Past Medical History  Diagnosis Date  . Hypertension   . Gastritis 05/2011    EGD-Dr. Rhea Belton   . Gastric ulcer 05/2011    EGD-Dr. Rhea Belton   . Depression   . Nicotine dependence   . Alcohol dependence     Quit Sept. 2012   . Helicobacter pylori (H. pylori) infection     Hx of     Past Surgical History  Procedure Laterality Date  . Appendectomy    . Cesarean section      Prior to Admission medications   Medication Sig Start Date End Date Taking? Authorizing Provider  atenolol (TENORMIN) 25 MG tablet TAKE 1 TABLET (25 MG TOTAL) BY MOUTH DAILY. 05/31/12  Yes Beverley Fiedler, MD  citalopram (CELEXA) 20 MG tablet Take 20 mg by mouth daily.   Yes Historical  Provider, MD  HYDROcodone-acetaminophen (NORCO/VICODIN) 5-325 MG per tablet Take 1 tablet by mouth every 6 (six) hours as needed for pain. 11/20/12  Yes Beverley Fiedler, MD  LORazepam (ATIVAN) 0.5 MG tablet Take 0.5 mg by mouth every 6 (six) hours as needed. For anxiety   Yes Historical Provider, MD  pantoprazole (PROTONIX) 40 MG tablet Take 40 mg by mouth daily.     Yes Historical Provider, MD  promethazine (PHENERGAN) 12.5 MG tablet Take 2 tablets (25 mg total) by mouth every 6 (six) hours as needed for nausea. 11/20/12  Yes Beverley Fiedler, MD  temazepam (RESTORIL) 15 MG capsule Take 15 mg by mouth at bedtime as needed. For sleep   Yes Historical Provider, MD  traMADol (ULTRAM) 50 MG tablet TAKE 1 TABLET (50 MG TOTAL) BY MOUTH EVERY 6 (SIX) HOURS AS NEEDED FOR PAIN. 06/28/12  Yes Beverley Fiedler, MD  traZODone (DESYREL) 50 MG tablet Take 100 mg by mouth at bedtime.  03/31/12  Yes Beverley Fiedler, MD    Current Facility-Administered Medications  Medication Dose Route Frequency Provider Last Rate Last Dose  . 0.9 %  sodium chloride infusion   Intravenous Continuous Lars Mage, MD 100 mL/hr at 11/28/12 0507    . 0.9 %  sodium chloride infusion   Intravenous STAT Gerhard Munch, MD      . atenolol (TENORMIN) tablet 25 mg  25 mg Oral Daily Lars Mage, MD      . citalopram (CELEXA) tablet 20 mg  20 mg Oral Daily Lars Mage, MD      . heparin injection 5,000 Units  5,000 Units Subcutaneous Q8H Lars Mage, MD   5,000 Units at 11/28/12 0521  . HYDROmorphone (DILAUDID) injection 1 mg  1 mg Intravenous Q3H PRN Alison Murray, MD   1 mg at 11/28/12 0905  . LORazepam (ATIVAN) tablet 0.5 mg  0.5 mg Oral Q6H PRN Lars Mage, MD      . pantoprazole (PROTONIX) EC tablet 40 mg  40 mg Oral Daily Lars Mage, MD      . promethazine (PHENERGAN) tablet 12.5 mg  12.5 mg Oral Q6H PRN Lars Mage, MD   12.5 mg at 11/28/12 0905  . temazepam (RESTORIL) capsule 15 mg  15 mg Oral QHS PRN Lars Mage, MD   15 mg at 11/28/12 0157  .  traZODone (DESYREL) tablet 100 mg  100 mg Oral QHS Lars Mage, MD   100 mg at 11/28/12 0157   Facility-Administered Medications Ordered in Other Encounters  Medication Dose Route Frequency Provider Last Rate Last Dose  . HYDROmorphone (DILAUDID) 1 MG/ML injection             Allergies as of 11/27/2012 - Review Complete 11/27/2012  Allergen Reaction Noted  . Aspirin Other (See Comments) 06/14/2011  . Vicodin (hydrocodone-acetaminophen) Other (See Comments) 06/14/2011    Family History  Problem Relation Age of Onset  . Colon cancer Neg Hx     History   Social History  . Marital Status: Widowed    Spouse Name: N/A    Number of Children: 2  . Years of Education: N/A   Occupational History  .  [employer]   Social History Main Topics  . Smoking status: Current Every Day Smoker -- 0.50 packs/day    Types: Cigarettes  . Smokeless tobacco: Never Used     Comment: Counseling to quit smoking given to patient in exam room   . Alcohol Use: No     Comment: Heavy drinker quit  Sept 2012   . Drug Use: No  . Sexually Active: No   Other Topics Concern  . Not on file   Social History Narrative  . No narrative on file    Review of Systems: Ten point ROS is O/W negative except as mentioned in HPI.  Physical Exam: Vital signs in last 24 hours: Temp:  [97.7 F (36.5 C)-98 F (36.7 C)] 98 F (36.7 C) (03/19 0556) Pulse Rate:  [60-67] 63 (03/19 0556) Resp:  [16-18] 16 (03/19 0556) BP: (106-148)/(55-87) 106/55 mmHg (03/19 0556) SpO2:  [95 %-98 %] 95 % (03/19 0556) Weight:  [144 lb (65.318 kg)-145 lb 3.2 oz (65.862 kg)] 145 lb 3.2 oz (65.862 kg) (03/19 0101) Last BM Date: 11/27/12 General:   Alert, Well-developed, well-nourished, pleasant and cooperative in NAD Head:  Normocephalic and atraumatic. Eyes:  Sclera clear, no icterus.  Conjunctiva pink. Ears:  Normal auditory acuity. Mouth:  No deformity or lesions.   Lungs:  Clear throughout to auscultation.  No  wheezes, crackles, or rhonchi.  Heart:  Regular rate and rhythm; no murmurs, clicks, rubs,  or gallops. Abdomen:  Soft, non-distended.  BS present.  RUQ TTP. Rectal:  Deferred  Msk:  Symmetrical without gross deformities. Pulses:  Normal pulses noted. Extremities:  Without clubbing or edema. Neurologic:  Alert and  oriented x4;  grossly normal neurologically. Skin:  Intact without significant lesions or rashes. Psych:  Alert and cooperative. Normal mood and affect.  Intake/Output from previous day: 03/18 0701 - 03/19 0700 In: 442 [I.V.:442] Out: -   Lab Results:  Recent Labs  11/27/12 2055 11/28/12 0400  WBC 13.7* 11.4*  HGB 13.9 11.8*  HCT 40.6 35.3*  PLT 355 277   BMET  Recent Labs  11/27/12 2055 11/28/12 0400  NA 136 135  K 3.4* 3.7  CL 98 102  CO2 27 26  GLUCOSE 74 89  BUN 7 6  CREATININE 0.82 0.83  CALCIUM 9.2 8.3*   LFT  Recent Labs  11/27/12 2055  PROT 7.0  ALBUMIN 3.8  AST 29  ALT 29  ALKPHOS 76  BILITOT 0.3   PT/INR  Recent Labs  11/27/12 2055  LABPROT 12.8  INR 0.97    Studies/Results: Nm Hepato W/eject Fract  11/27/2012  *RADIOLOGY REPORT*  Clinical Data:  Abdominal pain  NUCLEAR MEDICINE HEPATOBILIARY IMAGING  Technique:  Sequential images of the abdomen were obtained out to 60 minutes following intravenous administration of radiopharmaceutical.  Radiopharmaceutical:  5.56mCi Tc-32m Choletec  Comparison:  None.  Findings: Gallbladder activity failed to fill during the first hour.  There is normal uptake throughout the liver.  Small bowel activity occurs after 15 minutes.  After the administration of 2.6 mg morphine and 4 mg Zofran, additional imaging demonstrates gallbladder activity after 10 minutes.  IMPRESSION: Cystic and common bile ducts are patent.   Original Report Authenticated By: Jolaine Click, M.D.     IMPRESSION:  -RUQ abdominal pain:  Strongly suspect that this is gallbladder related. -History of Hpylori and related  ulcers  PLAN: -Will perform EGD today to confirm that there is no recurrent or ongoing ulcer disease, however, she was treated for her Hpylori, is not using NSAID's, and is taking daily PPI, so this is much less likely the cause of her pain. -Surgery to see the patient as well for cholecystectomy evaluation.   ZEHR, JESSICA D.  11/28/2012, 9:11 AM  Pager number [phone]

## 2012-11-28 NOTE — Progress Notes (Signed)
INITIAL NUTRITION ASSESSMENT  DOCUMENTATION CODES Per approved criteria  -Not Applicable   INTERVENTION: - Diet advancement per MD - RD to monitor intake  NUTRITION DIAGNOSIS: Altered GI function related to ongoing nausea/diarrhea as evidenced by pt statement.   Goal: 1. No further nausea/vomiting/diarrhea 2. Advance diet as tolerated to low fat, bland diet  Monitor:  Weights, labs, diet advancement  Reason for Assessment: Nutrition risk   52 y.o. female  Admitting Dx: Abdominal pain  ASSESSMENT: Patient is a 52 year old female with past medical history most significant for hypertension, gastric ulcer disease, depression, and chronic abdominal pain. Pt reports having nausea for the past 8-9 months. Pt reports for the past 2 weeks straight pt has had diarrhea within 15-20 minutes after eating anything. Pt reports during this time she has been eating mostly soups and drinking Sprite, coffee, and water. Pt reports Phenergan has been helping with her nausea. Pt denies any vomiting or diarrhea today. Pt reports her weight has been stable. Noted plans for cholecystectomy.    Height: Ht Readings from Last 1 Encounters:  11/28/12 5\' 4"  (1.626 m)    Weight: Wt Readings from Last 1 Encounters:  11/28/12 145 lb 3.2 oz (65.862 kg)    Ideal Body Weight: 120 lb  % Ideal Body Weight: 121  Wt Readings from Last 10 Encounters:  11/28/12 145 lb 3.2 oz (65.862 kg)  11/28/12 145 lb 3.2 oz (65.862 kg)  11/20/12 145 lb (65.772 kg)  07/07/11 133 lb 6.4 oz (60.51 kg)  07/04/11 136 lb (61.689 kg)  06/14/11 131 lb (59.421 kg)    Usual Body Weight: 145 lb  % Usual Body Weight: 100  BMI:  Body mass index is 24.91 kg/(m^2).  Estimated Nutritional Needs: Kcal: 1610-9604 Protein: 55-65g Fluid: 1.3-1.6L/day  Skin: Intact   Diet Order: Clear Liquid  EDUCATION NEEDS: -No education needs identified at this time   Intake/Output Summary (Last 24 hours) at 11/28/12 1648 Last data  filed at 11/28/12 0507  Gross per 24 hour  Intake    442 ml  Output      0 ml  Net    442 ml    Last BM: 3/18  Labs:   Recent Labs Lab 11/23/12 1732 11/27/12 2055 11/28/12 0400  NA 139 136 135  K 3.9 3.4* 3.7  CL 102 98 102  CO2  --  27 26  BUN 6 7 6   CREATININE 1.00 0.82 0.83  CALCIUM  --  9.2 8.3*  GLUCOSE 88 74 89    CBG (last 3)  No results found for this basename: GLUCAP,  in the last 72 hours  Scheduled Meds: . sodium chloride   Intravenous STAT  . [START ON 11/29/2012] atenolol  50 mg Oral Daily  . citalopram  20 mg Oral Daily  . heparin  5,000 Units Subcutaneous Q8H  . pantoprazole  40 mg Oral Daily  . traZODone  100 mg Oral QHS    Continuous Infusions: . sodium chloride 100 mL/hr at 11/28/12 0507  . sodium chloride 20 mL/hr (11/28/12 1515)    Past Medical History  Diagnosis Date  . Hypertension   . Gastritis 05/2011    EGD-Dr. Rhea Belton   . Gastric ulcer 05/2011    EGD-Dr. Rhea Belton   . Depression   . Nicotine dependence   . Alcohol dependence     Quit Sept. 2012   . Helicobacter pylori (H. pylori) infection     Hx of     Past Surgical History  Procedure Laterality Date  . Appendectomy    . Cesarean section       Levon Hedger MS, RD, LDN [phone] Pager [phone] After Hours Pager

## 2012-11-28 NOTE — Telephone Encounter (Signed)
Aram Beecham at [facility] called stating pt has been admitted for her abd pain. She is not sure if pt will be D/C and make appt with Dr Dwain Sarna on Friday. Pt is being worked up for abd pain and may or may not make appt. I advised her I will send msg to Dr Doreen Salvage assistant to review system prior to appt.

## 2012-11-28 NOTE — Telephone Encounter (Signed)
Late entry  11/27/12  Called pt to inform her her HIDA scan wasn't exactly normal and Dr Rhea Belton felt a surgical referral was needed since she is in so much pt. I had already called [facility] and had been given a rushed appt on Friday, 11/30/12. When I spoke with triage at [facility], the nurse stated if pt was in extreme pain, she should go to the hospital. Explained that to the pt and she stated she was going to the ER. This am she is admitted to [facility].

## 2012-11-29 ENCOUNTER — Other Ambulatory Visit: Payer: Self-pay

## 2012-11-29 ENCOUNTER — Inpatient Hospital Stay (HOSPITAL_COMMUNITY): Payer: BC Managed Care – PPO | Admitting: Anesthesiology

## 2012-11-29 ENCOUNTER — Encounter (HOSPITAL_COMMUNITY): Payer: Self-pay | Admitting: Anesthesiology

## 2012-11-29 ENCOUNTER — Encounter (HOSPITAL_COMMUNITY)
Admission: EM | Disposition: A | Discharge status: Home / Self Care | Payer: Self-pay | Source: Home / Self Care | Attending: Internal Medicine

## 2012-11-29 ENCOUNTER — Inpatient Hospital Stay (HOSPITAL_COMMUNITY): Payer: BC Managed Care – PPO

## 2012-11-29 ENCOUNTER — Encounter (HOSPITAL_COMMUNITY): Payer: Self-pay | Admitting: Internal Medicine

## 2012-11-29 HISTORY — PX: CHOLECYSTECTOMY: SHX55

## 2012-11-29 LAB — BASIC METABOLIC PANEL
CO2: 25 mEq/L (ref 19–32)
Chloride: 105 mEq/L (ref 96–112)
Glucose, Bld: 76 mg/dL (ref 70–99)
Sodium: 139 mEq/L (ref 135–145)

## 2012-11-29 LAB — CBC
Hemoglobin: 12.2 g/dL (ref 12.0–15.0)
MCV: 88.2 fL (ref 78.0–100.0)
Platelets: 278 10*3/uL (ref 150–400)
RBC: 4.22 MIL/uL (ref 3.87–5.11)
WBC: 10.9 10*3/uL — ABNORMAL HIGH (ref 4.0–10.5)

## 2012-11-29 SURGERY — LAPAROSCOPIC CHOLECYSTECTOMY WITH INTRAOPERATIVE CHOLANGIOGRAM
Anesthesia: General | Site: Abdomen | Wound class: Clean Contaminated

## 2012-11-29 MED ORDER — IOHEXOL 300 MG/ML  SOLN
INTRAMUSCULAR | Status: DC | PRN
  Administered 2012-11-29: 6 mL via INTRAVENOUS

## 2012-11-29 MED ORDER — MIDAZOLAM HCL 2 MG/2ML IJ SOLN
0.5000 mg | Freq: Once | INTRAMUSCULAR | Status: AC | PRN
  Administered 2012-11-29: 1 mg via INTRAVENOUS

## 2012-11-29 MED ORDER — ALTEPLASE (STROKE) FULL DOSE INFUSION: 0.9000 mg/kg | Freq: Once | INTRAVENOUS | Status: DC

## 2012-11-29 MED ORDER — HYDROCODONE-ACETAMINOPHEN 5-325 MG PO TABS
1.0000 | ORAL_TABLET | ORAL | Status: DC | PRN
  Administered 2012-11-30 (×2): 2 via ORAL
  Filled 2012-11-29 (×2): qty 2

## 2012-11-29 MED ORDER — ACETAMINOPHEN 10 MG/ML IV SOLN
INTRAVENOUS | Status: DC | PRN
  Administered 2012-11-29: 1000 mg via INTRAVENOUS

## 2012-11-29 MED ORDER — LIDOCAINE HCL (CARDIAC) 20 MG/ML IV SOLN
INTRAVENOUS | Status: DC | PRN
  Administered 2012-11-29: 50 mg via INTRAVENOUS

## 2012-11-29 MED ORDER — IOHEXOL 300 MG/ML  SOLN
INTRAMUSCULAR | Status: AC
  Filled 2012-11-29: qty 1

## 2012-11-29 MED ORDER — LACTATED RINGERS IR SOLN
Status: DC | PRN
  Administered 2012-11-29: 1000 mL

## 2012-11-29 MED ORDER — PROMETHAZINE HCL 25 MG/ML IJ SOLN: 6.2500 mg | INTRAMUSCULAR | Status: DC | PRN

## 2012-11-29 MED ORDER — SUCCINYLCHOLINE CHLORIDE 20 MG/ML IJ SOLN
INTRAMUSCULAR | Status: DC | PRN
  Administered 2012-11-29: 100 mg via INTRAVENOUS

## 2012-11-29 MED ORDER — MIDAZOLAM HCL 5 MG/5ML IJ SOLN
INTRAMUSCULAR | Status: DC | PRN
  Administered 2012-11-29: 2 mg via INTRAVENOUS

## 2012-11-29 MED ORDER — ROCURONIUM BROMIDE 100 MG/10ML IV SOLN
INTRAVENOUS | Status: DC | PRN
  Administered 2012-11-29: 30 mg via INTRAVENOUS
  Administered 2012-11-29: 5 mg via INTRAVENOUS

## 2012-11-29 MED ORDER — CEFAZOLIN SODIUM-DEXTROSE 2-3 GM-% IV SOLR
INTRAVENOUS | Status: AC
  Filled 2012-11-29: qty 50

## 2012-11-29 MED ORDER — HYDROMORPHONE HCL PF 1 MG/ML IJ SOLN: 1.0000 mg | INTRAMUSCULAR | Status: DC | PRN

## 2012-11-29 MED ORDER — HYDROMORPHONE HCL PF 1 MG/ML IJ SOLN
INTRAMUSCULAR | Status: DC | PRN
  Administered 2012-11-29 (×2): 1 mg via INTRAVENOUS

## 2012-11-29 MED ORDER — MIDAZOLAM HCL 2 MG/2ML IJ SOLN
INTRAMUSCULAR | Status: AC
  Filled 2012-11-29: qty 2

## 2012-11-29 MED ORDER — GLYCOPYRROLATE 0.2 MG/ML IJ SOLN
INTRAMUSCULAR | Status: DC | PRN
  Administered 2012-11-29: 0.6 mg via INTRAVENOUS

## 2012-11-29 MED ORDER — LACTATED RINGERS IV SOLN: INTRAVENOUS | Status: DC

## 2012-11-29 MED ORDER — KCL IN DEXTROSE-NACL 20-5-0.45 MEQ/L-%-% IV SOLN
INTRAVENOUS | Status: DC
  Administered 2012-11-29: 50 mL/h via INTRAVENOUS
  Filled 2012-11-29 (×2): qty 1000

## 2012-11-29 MED ORDER — FENTANYL CITRATE 0.05 MG/ML IJ SOLN
INTRAMUSCULAR | Status: DC | PRN
  Administered 2012-11-29: 50 ug via INTRAVENOUS
  Administered 2012-11-29 (×2): 100 ug via INTRAVENOUS

## 2012-11-29 MED ORDER — HYDROMORPHONE HCL PF 1 MG/ML IJ SOLN
0.2500 mg | INTRAMUSCULAR | Status: DC | PRN
  Administered 2012-11-29 (×3): 0.5 mg via INTRAVENOUS

## 2012-11-29 MED ORDER — PROPOFOL 10 MG/ML IV BOLUS
INTRAVENOUS | Status: DC | PRN
  Administered 2012-11-29: 180 mg via INTRAVENOUS

## 2012-11-29 MED ORDER — NEOSTIGMINE METHYLSULFATE 1 MG/ML IJ SOLN
INTRAMUSCULAR | Status: DC | PRN
  Administered 2012-11-29: 4 mg via INTRAVENOUS

## 2012-11-29 MED ORDER — HYDROMORPHONE HCL PF 1 MG/ML IJ SOLN
INTRAMUSCULAR | Status: AC
  Filled 2012-11-29: qty 1

## 2012-11-29 MED ORDER — FENTANYL CITRATE 0.05 MG/ML IJ SOLN: 25.0000 ug | INTRAMUSCULAR | Status: DC | PRN

## 2012-11-29 MED ORDER — LACTATED RINGERS IV SOLN
INTRAVENOUS | Status: DC
  Administered 2012-11-29: 1000 mL via INTRAVENOUS
  Administered 2012-11-29: 10:00:00 via INTRAVENOUS

## 2012-11-29 MED ORDER — ACETAMINOPHEN 10 MG/ML IV SOLN
INTRAVENOUS | Status: AC
  Filled 2012-11-29: qty 100

## 2012-11-29 MED ORDER — CEFAZOLIN SODIUM-DEXTROSE 2-3 GM-% IV SOLR
2.0000 g | INTRAVENOUS | Status: AC
  Administered 2012-11-29: 2 g via INTRAVENOUS

## 2012-11-29 MED ORDER — ACETAMINOPHEN 325 MG PO TABS: 650.0000 mg | ORAL_TABLET | ORAL | Status: DC | PRN

## 2012-11-29 MED ORDER — 0.9 % SODIUM CHLORIDE (POUR BTL) OPTIME
TOPICAL | Status: DC | PRN
  Administered 2012-11-29: 1000 mL

## 2012-11-29 MED ORDER — BUPIVACAINE-EPINEPHRINE 0.5% -1:200000 IJ SOLN
INTRAMUSCULAR | Status: DC | PRN
  Administered 2012-11-29: 21 mL

## 2012-11-29 MED ORDER — ONDANSETRON HCL 4 MG/2ML IJ SOLN
INTRAMUSCULAR | Status: DC | PRN
  Administered 2012-11-29: 4 mg via INTRAVENOUS

## 2012-11-29 MED ORDER — BUPIVACAINE-EPINEPHRINE (PF) 0.5% -1:200000 IJ SOLN
INTRAMUSCULAR | Status: AC
  Filled 2012-11-29: qty 10

## 2012-11-29 SURGICAL SUPPLY — 42 items
APPLIER CLIP ROT 10 11.4 M/L (STAPLE) ×2
BENZOIN TINCTURE PRP APPL 2/3 (GAUZE/BANDAGES/DRESSINGS) ×2 IMPLANT
CABLE HIGH FREQUENCY MONO STRZ (ELECTRODE) ×2 IMPLANT
CANISTER SUCTION 2500CC (MISCELLANEOUS) ×2 IMPLANT
CHLORAPREP W/TINT 26ML (MISCELLANEOUS) ×2 IMPLANT
CLIP APPLIE ROT 10 11.4 M/L (STAPLE) ×1 IMPLANT
CLOTH BEACON ORANGE TIMEOUT ST (SAFETY) ×2 IMPLANT
COVER MAYO STAND STRL (DRAPES) ×2 IMPLANT
DECANTER SPIKE VIAL GLASS SM (MISCELLANEOUS) IMPLANT
DRAPE C-ARM 42X72 X-RAY (DRAPES) ×2 IMPLANT
DRAPE LAPAROSCOPIC ABDOMINAL (DRAPES) ×2 IMPLANT
DRAPE UTILITY 15X26 (DRAPE) ×2 IMPLANT
ELECT REM PT RETURN 9FT ADLT (ELECTROSURGICAL) ×2
ELECTRODE REM PT RTRN 9FT ADLT (ELECTROSURGICAL) ×1 IMPLANT
GAUZE SPONGE 2X2 8PLY STRL LF (GAUZE/BANDAGES/DRESSINGS) ×1 IMPLANT
GLOVE BIOGEL PI IND STRL 7.0 (GLOVE) ×1 IMPLANT
GLOVE BIOGEL PI INDICATOR 7.0 (GLOVE) ×1
GLOVE SURG ORTHO 8.0 STRL STRW (GLOVE) ×2 IMPLANT
GOWN STRL NON-REIN LRG LVL3 (GOWN DISPOSABLE) ×2 IMPLANT
GOWN STRL REIN XL XLG (GOWN DISPOSABLE) ×4 IMPLANT
HEMOSTAT SURGICEL 4X8 (HEMOSTASIS) IMPLANT
KIT BASIN OR (CUSTOM PROCEDURE TRAY) ×2 IMPLANT
NS IRRIG 1000ML POUR BTL (IV SOLUTION) ×2 IMPLANT
POUCH SPECIMEN RETRIEVAL 10MM (ENDOMECHANICALS) ×2 IMPLANT
SCISSORS LAP 5X35 DISP (ENDOMECHANICALS) IMPLANT
SET CHOLANGIOGRAPH MIX (MISCELLANEOUS) ×2 IMPLANT
SET IRRIG TUBING LAPAROSCOPIC (IRRIGATION / IRRIGATOR) ×2 IMPLANT
SLEEVE Z-THREAD 5X100MM (TROCAR) IMPLANT
SOLUTION ANTI FOG 6CC (MISCELLANEOUS) ×2 IMPLANT
SPONGE GAUZE 2X2 STER 10/PKG (GAUZE/BANDAGES/DRESSINGS) ×1
STRIP CLOSURE SKIN 1/2X4 (GAUZE/BANDAGES/DRESSINGS) ×2 IMPLANT
SUT MNCRL AB 4-0 PS2 18 (SUTURE) ×2 IMPLANT
TAPE CLOTH SURG 4X10 WHT LF (GAUZE/BANDAGES/DRESSINGS) ×2 IMPLANT
TOWEL OR 17X26 10 PK STRL BLUE (TOWEL DISPOSABLE) ×2 IMPLANT
TRAY LAP CHOLE (CUSTOM PROCEDURE TRAY) ×2 IMPLANT
TROCAR BLADELESS OPT 5 75 (ENDOMECHANICALS) ×2 IMPLANT
TROCAR SLEEVE XCEL 5X75 (ENDOMECHANICALS) ×2 IMPLANT
TROCAR XCEL BLUNT TIP 100MML (ENDOMECHANICALS) ×2 IMPLANT
TROCAR XCEL NON-BLD 11X100MML (ENDOMECHANICALS) ×2 IMPLANT
TROCAR Z-THREAD FIOS 11X100 BL (TROCAR) IMPLANT
TROCAR Z-THREAD FIOS 5X100MM (TROCAR) IMPLANT
TUBING INSUFFLATION 10FT LAP (TUBING) ×2 IMPLANT

## 2012-11-29 NOTE — Transfer of Care (Signed)
Immediate Anesthesia Transfer of Care Note  Patient: Selena Farrell  Procedure(s) Performed: Procedure(s): LAPAROSCOPIC CHOLECYSTECTOMY WITH INTRAOPERATIVE CHOLANGIOGRAM (N/A)  Patient Location: PACU  Anesthesia Type:General  Level of Consciousness: awake, alert  and oriented  Airway & Oxygen Therapy: Patient Spontanous Breathing and Patient connected to face mask oxygen  Post-op Assessment: Report given to PACU RN and Post -op Vital signs reviewed and stable  Post vital signs: Reviewed and stable  Complications: No apparent anesthesia complications

## 2012-11-29 NOTE — Progress Notes (Signed)
[facility] PROGRESS NOTE  Selena Farrell FAO:[id number] DOB: 05/21/1961 DOA: 11/27/2012 PCP: Pearson Grippe, MD  Brief narrative: 52 year old female with past medical history of hypertension, peptic ulcer disease (history of positive H.Pylori in past) who presented 11/27/12 to [facility] ED with worsening epigastric pain started on the day of admission. Patient had HIDA scan done 11/27/12 with no significant findings related to cystic or common bile duct. Patient had EGD done 11/28/12 with essentially normal esophageal mucosa and mild antral gastropathy. Surgery consulted for cholecystectomy which was done today 11/29/2012.   Assessment/Plan:  Principal Problem:  Abdominal pain  Status post cholecystectomy, 11/29/2012. No subsequent complications. Status post EGD 11/28/2012 with normal esophageal mucosa and mild antral gastropathy. Will follow up on H.Pylori biopsy result - still pending. Appreciate surgery following Continue IV fluids  Continue protonix 40 mg daily  Pain management with morphine 2 mg every 2 hours as needed IV for severe pain Active Problems:  Hypokalemia  Secondary to GI losses  Potassium now WNL Hypertension  Increase atenolol to 50 mg a day (from 25 mg a day)  BP 138/60 Depression  Continue citalopram   Code Status: home when stable  Family Communication: at bedside  Disposition Plan: home when stable   Manson Passey, MD  [facility]  Pager [phone]   Consultants:  Surgery  Gastroenterology Procedures:  EGD 11/28/12 HIDA 11/27/12 Cholecystectomy 11/29/2012 Antibiotics:  None    If 7PM-7AM, please contact night-coverage [email or web address] Password [facility] 11/29/2012, 6:48 AM   LOS: 2 days    HPI/Subjective: No acute overnight events.  Objective: Filed Vitals:   11/28/12 1200 11/28/12 1210 11/28/12 1305 11/28/12 2355  BP: 103/57 108/55 116/63 122/64  Pulse:   60 64  Temp:   97.7 F (36.5 C) 98 F (36.7 C)  TempSrc:   Oral Oral  Resp: 14 12 16 16   Height:       Weight:      SpO2: 94% 94% 97% 94%    Intake/Output Summary (Last 24 hours) at 11/29/12 0648 Last data filed at 11/28/12 1700  Gross per 24 hour  Intake    600 ml  Output      0 ml  Net    600 ml    Exam:   General:  Pt is alert, follows commands appropriately, not in acute distress  Cardiovascular: Regular rate and rhythm, S1/S2, no murmurs, no rubs, no gallops  Respiratory: Clear to auscultation bilaterally, no wheezing, no crackles, no rhonchi  Abdomen: Soft, tender to palpation, bowel sounds present, no guarding  Extremities: No edema, pulses DP and PT palpable bilaterally  Neuro: Grossly nonfocal  Data Reviewed: Basic Metabolic Panel:  Recent Labs Lab 11/23/12 1732 11/27/12 2055 11/28/12 0400 11/29/12 0421  NA 139 136 135 139  K 3.9 3.4* 3.7 3.6  CL 102 98 102 105  CO2  --  27 26 25   GLUCOSE 88 74 89 76  BUN 6 7 6 6   CREATININE 1.00 0.82 0.83 0.77  CALCIUM  --  9.2 8.3* 8.7   Liver Function Tests:  Recent Labs Lab 11/27/12 2055  AST 29  ALT 29  ALKPHOS 76  BILITOT 0.3  PROT 7.0  ALBUMIN 3.8    Recent Labs Lab 11/27/12 2055  LIPASE 21   No results found for this basename: AMMONIA,  in the last 168 hours CBC:  Recent Labs Lab 11/23/12 1732 11/27/12 2055 11/28/12 0400 11/29/12 0421  WBC  --  13.7* 11.4* 10.9*  NEUTROABS  --  7.5 5.9  --   HGB 14.3 13.9 11.8* 12.2  HCT 42.0 40.6 35.3* 37.2  MCV  --  87.1 88.3 88.2  PLT  --  355 277 278   Cardiac Enzymes: No results found for this basename: CKTOTAL, CKMB, CKMBINDEX, TROPONINI,  in the last 168 hours BNP: No components found with this basename: POCBNP,  CBG: No results found for this basename: GLUCAP,  in the last 168 hours  Recent Results (from the past 240 hour(s))  MRSA PCR SCREENING     Status: None   Collection Time    11/28/12  5:17 AM      Result Value Range Status   MRSA by PCR NEGATIVE  NEGATIVE Final   Comment:            The GeneXpert MRSA Assay (FDA      approved for NASAL specimens     only), is one component of a     comprehensive MRSA colonization     surveillance program. It is not     intended to diagnose MRSA     infection nor to guide or     monitor treatment for     MRSA infections.     Studies: Nm Hepato W/eject Fract  11/27/2012  *RADIOLOGY REPORT*  Clinical Data:  Abdominal pain  NUCLEAR MEDICINE HEPATOBILIARY IMAGING  Technique:  Sequential images of the abdomen were obtained out to 60 minutes following intravenous administration of radiopharmaceutical.  Radiopharmaceutical:  5.74mCi Tc-70m Choletec  Comparison:  None.  Findings: Gallbladder activity failed to fill during the first hour.  There is normal uptake throughout the liver.  Small bowel activity occurs after 15 minutes.  After the administration of 2.6 mg morphine and 4 mg Zofran, additional imaging demonstrates gallbladder activity after 10 minutes.  IMPRESSION: Cystic and common bile ducts are patent.   Original Report Authenticated By: Jolaine Click, M.D.     Scheduled Meds: . atenolol  50 mg Oral Daily  . citalopram  20 mg Oral Daily  . heparin  5,000 Units Subcutaneous Q8H  . pantoprazole  40 mg Oral Daily  . traZODone  100 mg Oral QHS   Continuous Infusions: . sodium chloride 100 mL/hr at 11/29/12 0156  . sodium chloride 20 mL/hr (11/28/12 1515)

## 2012-11-29 NOTE — Op Note (Signed)
Procedure Note  Pre-operative Diagnosis:  Abdominal pain, suspect biliary dyskinesia  Post-operative Diagnosis: Same  Surgeon:  Velora Heckler, MD, FACS  Procedure:  Laparoscopic cholecystectomy with intra-operative cholangiography  Assistant:  none   Anesthesia:  General  Indications: This patient presents with symptomatic gallbladder disease and will undergo laparoscopic cholecystectomy with intraoperative cholangiography.  Procedure Details: The patient was seen in the pre-op holding area. The risks, benefits, complications, treatment options, and expected outcomes have been discussed with the patient. The patient and/or family agreed with the proposed plan and signed the informed consent form.  The patient was taken to Operating Room, identified as Selena Farrell and the procedure verified as Laparoscopic Cholecystectomy with Intraoperative Cholangiogram. A "time out" was completed and the above information confirmed.  Prior to the induction of general anesthesia, antibiotic prophylaxis was administered. General endotracheal anesthesia was then administered and tolerated well. After the induction, the abdomen was prepped in the usual strict aseptic fashion. The patient was in the supine position.  An incision was made in the skin near the umbilicus. The midline fascia was incised and the peritoneal cavity entered and the Hasson canula was introduced under direct vision. Hasson canula was secured with a pursestring 0-Vicryl suture. Pneumoperitoneum was then established with carbon dioxide and tolerated well without any adverse changes in the patient's vital signs. Additional trocars were introduced under direct vision along the right costal margin in the midline, mid-clavicular line, and anterior axillary line.  The gallbladder was identified and the fundus grasped and retracted cephalad. Adhesions were taken down bluntly and with the electrocautery as needed, taking care not to injure  any adjacent structures. The infundibulum was grasped and retracted laterally, exposing the peritoneum overlying the triangle of Calot. This was incised and structures exposed in a blunt fashion. The cystic duct was clearly identified and bluntly dissected circumferentially and clipped at the neck of the gallbladder.  An incision was made in the cystic duct and the cholangiogram catheter introduced. The catheter was secured using an ligaclip.  Real-time cholangiography was performed using the C-arm.  There was rapid filling of a normal caliber common bile duct.  There was reflux of contrast into the left and right hepatic ductal systems.  There was free flow distally into the duodenum without filling defect or obstruction.  Catheter was removed from the peritoneal cavity.  The cystic duct was then triply ligated with surgical clips and divided. The cystic artery was identified, dissected circumferentially, ligated with ligaclips, and divided.   The gallbladder was dissected from the liver bed with the electrocautery used for hemostasis. The gallbladder was completely removed and placed into an endocatch bag. The right upper quadrant was irrigated and inspected. Hemostasis was achieved with the electrocautery. Warm saline irrigation was utilized and was repeatedly aspirated until clear.  Pneumoperitoneum was released after viewing removal of the trocars with good hemostasis noted. The umbilical wound was irrigated and the fascia was then closed with the pursestring suture.  The skin was then closed with 4-0 Monocril subcuticular sutures and sterile dressings were applied.  Instrument, sponge, and needle counts were correct at the conclusion of the case.  The patient tolerated the procedure well.  Estimated Blood Loss: Minimal         Drains: none         Specimens: Gallbladder to pathology         Disposition: PACU - hemodynamically stable.         Condition: stable   Tawanna Cooler  Molly Maduro, MD,  [facility] Office: [phone]

## 2012-11-29 NOTE — Anesthesia Postprocedure Evaluation (Signed)
Anesthesia Post Note  Patient: Selena Farrell  Procedure(s) Performed: Procedure(s) (LRB): LAPAROSCOPIC CHOLECYSTECTOMY WITH INTRAOPERATIVE CHOLANGIOGRAM (N/A)  Anesthesia type: General  Patient location: PACU  Post pain: Pain level controlled  Post assessment: Post-op Vital signs reviewed  Last Vitals:  Filed Vitals:   11/29/12 1113  BP: 138/60  Pulse: 64  Temp: 36.6 C  Resp: 14    Post vital signs: Reviewed  Level of consciousness: sedated  Complications: No apparent anesthesia complications

## 2012-11-29 NOTE — Anesthesia Preprocedure Evaluation (Addendum)
Anesthesia Evaluation  Patient identified by MRN, date of birth, ID band Patient awake    Reviewed: Allergy & Precautions, H&P , NPO status , Patient's Chart, lab work & pertinent test results  Airway Mallampati: II TM Distance: >3 FB Neck ROM: Full    Dental  (+) Teeth Intact and Caps,    Pulmonary neg pulmonary ROS, Current Smoker,  breath sounds clear to auscultation  Pulmonary exam normal       Cardiovascular hypertension, Pt. on medications and Pt. on home beta blockers Rhythm:Regular Rate:Normal     Neuro/Psych Depression negative neurological ROS  negative psych ROS   GI/Hepatic Neg liver ROS, PUD, (+)     substance abuse (ETOH abuse; quit 05/2011)  alcohol use,   Endo/Other  negative endocrine ROS  Renal/GU negative Renal ROS  negative genitourinary   Musculoskeletal negative musculoskeletal ROS (+)   Abdominal   Peds negative pediatric ROS (+)  Hematology negative hematology ROS (+)   Anesthesia Other Findings   Reproductive/Obstetrics negative OB ROS                          Anesthesia Physical Anesthesia Plan  ASA: II  Anesthesia Plan: General   Post-op Pain Management:    Induction: Intravenous  Airway Management Planned: Oral ETT  Additional Equipment:   Intra-op Plan:   Post-operative Plan: Extubation in OR  Informed Consent: I have reviewed the patients History and Physical, chart, labs and discussed the procedure including the risks, benefits and alternatives for the proposed anesthesia with the patient or authorized representative who has indicated his/her understanding and acceptance.   Dental advisory given  Plan Discussed with: CRNA  Anesthesia Plan Comments:         Anesthesia Quick Evaluation

## 2012-11-30 ENCOUNTER — Ambulatory Visit (INDEPENDENT_AMBULATORY_CARE_PROVIDER_SITE_OTHER): Payer: Self-pay | Admitting: General Surgery

## 2012-11-30 ENCOUNTER — Telehealth (INDEPENDENT_AMBULATORY_CARE_PROVIDER_SITE_OTHER): Payer: Self-pay | Admitting: *Deleted

## 2012-11-30 DIAGNOSIS — K828 Other specified diseases of gallbladder: Secondary | ICD-10-CM

## 2012-11-30 MED ORDER — POLYETHYLENE GLYCOL 3350 17 G PO PACK: 17.0000 g | PACK | Freq: Every day | ORAL | Status: DC

## 2012-11-30 MED ORDER — ONDANSETRON HCL 4 MG PO TABS: 4.0000 mg | ORAL_TABLET | Freq: Three times a day (TID) | ORAL | Status: DC | PRN

## 2012-11-30 MED ORDER — HYDROCODONE-ACETAMINOPHEN 5-325 MG PO TABS: 1.0000 | ORAL_TABLET | Freq: Four times a day (QID) | ORAL | Status: DC | PRN

## 2012-11-30 MED ORDER — MORPHINE SULFATE 15 MG PO TABS: 15.0000 mg | ORAL_TABLET | ORAL | Status: DC | PRN

## 2012-11-30 MED ORDER — LORAZEPAM 0.5 MG PO TABS: 0.5000 mg | ORAL_TABLET | Freq: Four times a day (QID) | ORAL | Status: DC | PRN

## 2012-11-30 MED ORDER — PANTOPRAZOLE SODIUM 40 MG PO TBEC: 40.0000 mg | DELAYED_RELEASE_TABLET | Freq: Every day | ORAL | Status: DC

## 2012-11-30 NOTE — Telephone Encounter (Signed)
Called patient to advise her of her follow up appt on 12/18/2012 at 12:00 per Dr. Gerrit Friends.  Patient agreeable at this time with date and time.  Patient states she is feeling a lot better.

## 2012-11-30 NOTE — Progress Notes (Signed)
Patient ID: Selena Farrell, female   DOB: [birth year], 52 y.o.   MRN: [id number]  General Surgery - [facility] - Progress Note  POD# 1  Subjective: Patient feels much improved this AM.  Tolerating clear liquids.  No nausea.  Mild pain.  Objective: Vital signs in last 24 hours: Temp:  [97.8 F (36.6 C)-98.6 F (37 C)] 98.1 F (36.7 C) (03/21 0700) Pulse Rate:  [56-74] 64 (03/21 0700) Resp:  [10-20] 20 (03/21 0700) BP: (125-163)/(60-75) 128/70 mmHg (03/21 0700) SpO2:  [93 %-100 %] 93 % (03/21 0700) Last BM Date: 11/27/12  Intake/Output from previous day: 03/20 0701 - 03/21 0700 In: 3440.8 [P.O.:720; I.V.:2720.8] Out: 750 [Urine:750]  Exam: HEENT - clear, not icteric Neck - soft Chest - clear bilaterally Cor - RRR, no murmur Abd - soft without distension; dressings dry and intact Ext - no significant edema Neuro - grossly intact, no focal deficits  Lab Results:   Recent Labs  11/28/12 0400 11/29/12 0421  WBC 11.4* 10.9*  HGB 11.8* 12.2  HCT 35.3* 37.2  PLT 277 278     Recent Labs  11/28/12 0400 11/29/12 0421  NA 135 139  K 3.7 3.6  CL 102 105  CO2 26 25  GLUCOSE 89 76  BUN 6 6  CREATININE 0.83 0.77  CALCIUM 8.3* 8.7    Studies/Results: Dg Cholangiogram Operative  11/29/2012  *RADIOLOGY REPORT*  Clinical Data:   Biliary colic.  INTRAOPERATIVE CHOLANGIOGRAM  Technique:  Cholangiographic images from the C-arm fluoroscopic device were submitted for interpretation post-operatively.  Please see the procedural report for the amount of contrast and the fluoroscopy time utilized.  Comparison:  MRI 11/23/2012.  Findings:  Fluoroscopic cine imaging from intraoperative cholangiogram demonstrates a dilated common bile duct.  Poor filling of the intrahepatic ducts.  There is normal drainage of contrast into the duodenum but there is moderate debris in the distal common bile duct which could be a combination of small stones, inflammatory debris or  sludge.  No extravasation of contrast is demonstrated.  IMPRESSION:  1.  Dilated distal common bile duct with irregular filling defects, likely a combination of sludge, inflammatory debris or small stones. 2.  Poor opacification of the intrahepatic ducts. 3.  Normal drainage of contrast into the duodenum.   Original Report Authenticated By: Rudie Meyer, M.D.     Assessment / Plan: 1.  Status post lap chole for biliary dyskinesia, chronic cholecystitis  Doing well this AM  Will advance diet to regular now  OK for discharge home after lunch from surgical standpoint  Will see in office at [facility] in 2-3 weeks  Medical service to discharge later today if appropriate  Velora Heckler, MD, [facility] Office: [phone]  11/30/2012

## 2012-11-30 NOTE — Discharge Summary (Signed)
Physician Discharge Summary  Selena Farrell:[id number] DOB: 10-23-1960 DOA: 11/27/2012  PCP: Pearson Grippe, MD  Admit date: 11/27/2012 Discharge date: 11/30/2012  Recommendations for Outpatient Follow-up:  1. Followup with PCP in one to 2 weeks after discharge or sooner if symptoms persist.  Discharge Diagnoses:  Principal Problem:   Abdominal pain Active Problems:   PUD (peptic ulcer disease)   Hypokalemia   Leukocytosis, unspecified  Discharge Condition: Medically stable for discharge home today  Diet recommendation: As tolerated  History of present illness:  52 year old female with past medical history of hypertension, peptic ulcer disease (history of positive H.Pylori in past) who presented 11/27/12 to [facility] ED with worsening epigastric pain started on the day of admission. Patient had HIDA scan done 11/27/12 with no significant findings related to cystic or common bile duct. Patient had EGD done 11/28/12 with essentially normal esophageal mucosa and mild antral gastropathy. Surgery consulted for cholecystectomy which was done 11/29/2012.   Assessment/Plan:   Principal Problem:  Abdominal pain  Status post cholecystectomy, 11/29/2012. No subsequent complications.  Status post EGD 11/28/2012 with normal esophageal mucosa and mild antral gastropathy. Will follow up on H.Pylori biopsy result - H. pylori negative  Continue protonix 40 mg daily  Appreciate surgery and GI following while patient in hospital. Patient stable for discharge home today. Active Problems:  Hypokalemia  Secondary to GI losses  Potassium now WNL Hypertension  Increase atenolol to 50 mg a day (from 25 mg a day)  BP 138/60 Depression  Continue citalopram   Code Status: home when stable  Family Communication: at bedside    Manson Passey, MD  [facility]  Pager [phone]   Consultants:  Surgery  Gastroenterology Procedures:  EGD 11/28/12  HIDA 11/27/12  Cholecystectomy 11/29/2012 Antibiotics:  None     Discharge Exam: Filed Vitals:   11/30/12 0700  BP: 128/70  Pulse: 64  Temp: 98.1 F (36.7 C)  Resp: 20   Filed Vitals:   11/29/12 2100 11/30/12 0013 11/30/12 0019 11/30/12 0700  BP:  125/74 125/74 128/70  Pulse: 56  57 64  Temp:   98.2 F (36.8 C) 98.1 F (36.7 C)  TempSrc:   Oral Oral  Resp:   20 20  Height:      Weight:      SpO2:   96% 93%    General: Pt is alert, follows commands appropriately, not in acute distress Cardiovascular: Regular rate and rhythm, S1/S2 +, no murmurs, no rubs, no gallops Respiratory: Clear to auscultation bilaterally, no wheezing, no crackles, no rhonchi Abdominal: Soft, non tender, non distended, bowel sounds +, no guarding Extremities: no edema, no cyanosis, pulses palpable bilaterally DP and PT Neuro: Grossly nonfocal  Discharge Instructions  Discharge Orders   Future Appointments Provider Department Dept Phone   12/18/2012 12:00 PM [facility] Doc Of The Week [facility], [place] [phone]   Future Orders Complete By Expires     Call MD for:  difficulty breathing, headache or visual disturbances  As directed     Call MD for:  persistant dizziness or light-headedness  As directed     Call MD for:  persistant nausea and vomiting  As directed     Call MD for:  severe uncontrolled pain  As directed     Diet - low sodium heart healthy  As directed     Increase activity slowly  As directed         Medication List    STOP taking these medications  promethazine 12.5 MG tablet  Commonly known as:  PHENERGAN      TAKE these medications       atenolol 25 MG tablet  Commonly known as:  TENORMIN  TAKE 1 TABLET (25 MG TOTAL) BY MOUTH DAILY.     citalopram 20 MG tablet  Commonly known as:  CELEXA  Take 20 mg by mouth daily.     HYDROcodone-acetaminophen 5-325 MG per tablet  Commonly known as:  NORCO/VICODIN  Take 1 tablet by mouth every 6 (six) hours as needed for pain.     LORazepam 0.5 MG tablet  Commonly known  as:  ATIVAN  Take 1 tablet (0.5 mg total) by mouth every 6 (six) hours as needed. For anxiety     morphine 15 MG tablet  Commonly known as:  MSIR  Take 1 tablet (15 mg total) by mouth every 4 (four) hours as needed for pain.     ondansetron 4 MG tablet  Commonly known as:  ZOFRAN  Take 1 tablet (4 mg total) by mouth every 8 (eight) hours as needed for nausea.     pantoprazole 40 MG tablet  Commonly known as:  PROTONIX  Take 1 tablet (40 mg total) by mouth daily.     polyethylene glycol packet  Commonly known as:  MIRALAX  Take 17 g by mouth daily.     temazepam 15 MG capsule  Commonly known as:  RESTORIL  Take 15 mg by mouth at bedtime as needed. For sleep     traMADol 50 MG tablet  Commonly known as:  ULTRAM  TAKE 1 TABLET (50 MG TOTAL) BY MOUTH EVERY 6 (SIX) HOURS AS NEEDED FOR PAIN.     traZODone 50 MG tablet  Commonly known as:  DESYREL  Take 100 mg by mouth at bedtime.           Follow-up Information   Follow up with Pearson Grippe, MD In 2 weeks.   Contact information:   [address] [place] [address] [phone]        The results of significant diagnostics from this hospitalization (including imaging, microbiology, ancillary and laboratory) are listed below for reference.    Significant Diagnostic Studies: Dg Cholangiogram Operative  11/29/2012  *RADIOLOGY REPORT*  Clinical Data:   Biliary colic.  INTRAOPERATIVE CHOLANGIOGRAM  Technique:  Cholangiographic images from the C-arm fluoroscopic device were submitted for interpretation post-operatively.  Please see the procedural report for the amount of contrast and the fluoroscopy time utilized.  Comparison:  MRI 11/23/2012.  Findings:  Fluoroscopic cine imaging from intraoperative cholangiogram demonstrates a dilated common bile duct.  Poor filling of the intrahepatic ducts.  There is normal drainage of contrast into the duodenum but there is moderate debris in the distal common bile duct which  could be a combination of small stones, inflammatory debris or sludge.  No extravasation of contrast is demonstrated.  IMPRESSION:  1.  Dilated distal common bile duct with irregular filling defects, likely a combination of sludge, inflammatory debris or small stones. 2.  Poor opacification of the intrahepatic ducts. 3.  Normal drainage of contrast into the duodenum.   Original Report Authenticated By: Rudie Meyer, M.D.    Mr 3d Recon At Scanner  11/24/2012  *RADIOLOGY REPORT*  Clinical Data:  Epigastric pain.  Borderline biliary dilatation and possible choledocholithiasis or biliary stent seen on recent ultrasound.  MRI ABDOMEN WITHOUT AND WITH CONTRAST (MRCP)  Technique:  Multiplanar multisequence MR imaging of the abdomen was  performed without and with contrast, including heavily T2-weighted images of the biliary and pancreatic ducts.  Three-dimensional MR images were rendered by post processing of the original MR data.  Contrast: 13mL MULTIHANCE GADOBENATE DIMEGLUMINE 529 MG/ML IV SOLN  Comparison:  Ultrasound on 11/22/2012  Findings:  The extrahepatic common bile duct is mildly dilated to a diameter of 9 mm, however there is no evidence of dilatation of intrahepatic bile ducts.  The distal common bile duct shows tapering distally, and there is no evidence of choledocholithiasis or stent within the common bile duct.  Gallbladder is unremarkable. There is no evidence of pancreatic ductal dilatation.  No evidence of pancreas divisum.  Hepatic parenchyma has normal signal intensity and there is no evidence of hepatic mass.  No evidence of pancreatic mass or inflammatory changes.  The spleen, adrenal glands, and kidneys are normal appearance.  No evidence of hydronephrosis.  No other soft tissue masses or lymphadenopathy identified within the abdomen.  No evidence of inflammatory process or abnormal fluid collections.  IMPRESSION:  1.  Mild dilatation of the extrahepatic common bile duct, without evidence of  intrahepatic ductal dilatation or other signs of obstruction.  No evidence of choledocholithiasis or biliary stent. 2.  Normal pancreatic duct.  No other significant abnormality identified.   Original Report Authenticated By: Myles Rosenthal, M.D.    Nm Hepato W/eject Fract  11/27/2012  *RADIOLOGY REPORT*  Clinical Data:  Abdominal pain  NUCLEAR MEDICINE HEPATOBILIARY IMAGING  Technique:  Sequential images of the abdomen were obtained out to 60 minutes following intravenous administration of radiopharmaceutical.  Radiopharmaceutical:  5.69mCi Tc-14m Choletec  Comparison:  None.  Findings: Gallbladder activity failed to fill during the first hour.  There is normal uptake throughout the liver.  Small bowel activity occurs after 15 minutes.  After the administration of 2.6 mg morphine and 4 mg Zofran, additional imaging demonstrates gallbladder activity after 10 minutes.  IMPRESSION: Cystic and common bile ducts are patent.   Original Report Authenticated By: Jolaine Click, M.D.    US Abdomen Limited  11/22/2012  **ADDENDUM** CREATED: 11/22/2012 13:41:35  By report, a prior stent placement common bile duct cannot be confirmed.  There is linear echogenic material in the mid to distal common bile duct.  If this material does not represent a stent, it presumably represents a linear debris or other inflammatory type change.  Given this circumstance, it may be prudent to correlate with MRCP or ERCP to further assess.  Abdominal radiograph can be helpful to assess for possible stent given questionable clinical history.  **END ADDENDUM** SIGNED BY: Rutherford Guys. Margarita Grizzle, M.D.   11/22/2012  *RADIOLOGY REPORT*  Ultrasound right upper quadrant  History:  Right upper quadrant pain  Comparison:  CT abdomen April 13, 2012  Findings:  The gallbladder is visualized in multiple projections. There are no apparent gallstones.  There is no gallbladder wall thickening or pericholecystic fluid collection.  There is no intrahepatic biliary duct  dilatation.  A stent traverses the mid to distal common bile duct.  The common hepatic duct measures 8.5 mm, mildly prominent, but otherwise normal.  No mass or calculus is seen in the common hepatic duct.  The stent obscures visualization of potential lesions within the common bile duct more distally.  There is no fluid surrounding the biliary ducts.  No focal liver lesions are identified.  Conclusion:  Stent in mid to distal common bile duct.  Common hepatic duct is borderline prominent but otherwise appears normal. Study otherwise unremarkable.  Original Report Authenticated By: Bretta Bang, M.D.    Dg Abd 2 Views  11/23/2012  *RADIOLOGY REPORT*  Clinical Data: Right upper quadrant pain.  ABDOMEN - 2 VIEW  Comparison: 04/13/2012  Findings: IUD noted in the pelvis.  There is a nonobstructive bowel gas pattern.  Gas noted within the common bile duct and intrahepatic ducts.  There is a rounded gas collection lateral to the common bile duct gas.  This could represent a small amount of gas within the gallbladder.  No organomegaly or suspicious calcification. No biliary ductal stent visualized.  IMPRESSION: Pneumobilia.  Possible small amount of gas within the gallbladder. No biliary ductal stent visualized.   Original Report Authenticated By: Charlett Nose, M.D.    Mr Abd W/wo Cm/mrcp  11/24/2012  *RADIOLOGY REPORT*  Clinical Data:  Epigastric pain.  Borderline biliary dilatation and possible choledocholithiasis or biliary stent seen on recent ultrasound.  MRI ABDOMEN WITHOUT AND WITH CONTRAST (MRCP)  Technique:  Multiplanar multisequence MR imaging of the abdomen was performed without and with contrast, including heavily T2-weighted images of the biliary and pancreatic ducts.  Three-dimensional MR images were rendered by post processing of the original MR data.  Contrast: 13mL MULTIHANCE GADOBENATE DIMEGLUMINE 529 MG/ML IV SOLN  Comparison:  Ultrasound on 11/22/2012  Findings:  The extrahepatic common bile  duct is mildly dilated to a diameter of 9 mm, however there is no evidence of dilatation of intrahepatic bile ducts.  The distal common bile duct shows tapering distally, and there is no evidence of choledocholithiasis or stent within the common bile duct.  Gallbladder is unremarkable. There is no evidence of pancreatic ductal dilatation.  No evidence of pancreas divisum.  Hepatic parenchyma has normal signal intensity and there is no evidence of hepatic mass.  No evidence of pancreatic mass or inflammatory changes.  The spleen, adrenal glands, and kidneys are normal appearance.  No evidence of hydronephrosis.  No other soft tissue masses or lymphadenopathy identified within the abdomen.  No evidence of inflammatory process or abnormal fluid collections.  IMPRESSION:  1.  Mild dilatation of the extrahepatic common bile duct, without evidence of intrahepatic ductal dilatation or other signs of obstruction.  No evidence of choledocholithiasis or biliary stent. 2.  Normal pancreatic duct.  No other significant abnormality identified.   Original Report Authenticated By: Myles Rosenthal, M.D.     Microbiology: Recent Results (from the past 240 hour(s))  MRSA PCR SCREENING     Status: None   Collection Time    11/28/12  5:17 AM      Result Value Range Status   MRSA by PCR NEGATIVE  NEGATIVE Final   Comment:            The GeneXpert MRSA Assay (FDA     approved for NASAL specimens     only), is one component of a     comprehensive MRSA colonization     surveillance program. It is not     intended to diagnose MRSA     infection nor to guide or     monitor treatment for     MRSA infections.     Labs: Basic Metabolic Panel:  Recent Labs Lab 11/23/12 1732 11/27/12 2055 11/28/12 0400 11/29/12 0421  NA 139 136 135 139  K 3.9 3.4* 3.7 3.6  CL 102 98 102 105  CO2  --  27 26 25   GLUCOSE 88 74 89 76  BUN 6 7 6 6   CREATININE 1.00 0.82 0.83 0.77  CALCIUM  --  9.2 8.3* 8.7   Liver Function  Tests:  Recent Labs Lab 11/27/12 2055  AST 29  ALT 29  ALKPHOS 76  BILITOT 0.3  PROT 7.0  ALBUMIN 3.8    Recent Labs Lab 11/27/12 2055  LIPASE 21   No results found for this basename: AMMONIA,  in the last 168 hours CBC:  Recent Labs Lab 11/23/12 1732 11/27/12 2055 11/28/12 0400 11/29/12 0421  WBC  --  13.7* 11.4* 10.9*  NEUTROABS  --  7.5 5.9  --   HGB 14.3 13.9 11.8* 12.2  HCT 42.0 40.6 35.3* 37.2  MCV  --  87.1 88.3 88.2  PLT  --  355 277 278   Cardiac Enzymes: No results found for this basename: CKTOTAL, CKMB, CKMBINDEX, TROPONINI,  in the last 168 hours BNP: BNP (last 3 results) No results found for this basename: PROBNP,  in the last 8760 hours CBG: No results found for this basename: GLUCAP,  in the last 168 hours  Time coordinating discharge: Over 30 minutes  Signed:  Manson Passey, MD  [facility]  11/30/2012, 12:15 PM  Pager #: [phone]

## 2012-11-30 NOTE — Progress Notes (Signed)
[facility] Progress Note  Subjective:  Feels much better after cholecystectomy yesterday.  Says that pain is minimal and just sore from her surgery.  Tolerated clears and is advancing to regular diet this AM.  Objective:  Vital signs in last 24 hours: Temp:  [97.8 F (36.6 C)-98.6 F (37 C)] 98.1 F (36.7 C) (03/21 0700) Pulse Rate:  [56-74] 64 (03/21 0700) Resp:  [10-20] 20 (03/21 0700) BP: (125-163)/(60-75) 128/70 mmHg (03/21 0700) SpO2:  [93 %-100 %] 93 % (03/21 0700) Last BM Date: 11/27/12 General:   Alert, Well-developed, in NAD Heart:  Regular rate and rhythm; no murmurs Pulm:  CTAB.  No W/R/R. Abdomen:  Soft, non-distended.  Good bowel sounds.  Minimal diffuse TTP.  No localized RUQ tenderness. Extremities:  Without edema. Neurologic:  Alert and  oriented x4;  grossly normal neurologically. Psych:  Alert and cooperative. Normal mood and affect.  Intake/Output from previous day: 03/20 0701 - 03/21 0700 In: 3440.8 [P.O.:720; I.V.:2720.8] Out: 750 [Urine:750]  Lab Results:  Recent Labs  11/27/12 2055 11/28/12 0400 11/29/12 0421  WBC 13.7* 11.4* 10.9*  HGB 13.9 11.8* 12.2  HCT 40.6 35.3* 37.2  PLT 355 277 278   BMET  Recent Labs  11/27/12 2055 11/28/12 0400 11/29/12 0421  NA 136 135 139  K 3.4* 3.7 3.6  CL 98 102 105  CO2 27 26 25   GLUCOSE 74 89 76  BUN 7 6 6   CREATININE 0.82 0.83 0.77  CALCIUM 9.2 8.3* 8.7   LFT  Recent Labs  11/27/12 2055  PROT 7.0  ALBUMIN 3.8  AST 29  ALT 29  ALKPHOS 76  BILITOT 0.3   PT/INR  Recent Labs  11/27/12 2055  LABPROT 12.8  INR 0.97   Dg Cholangiogram Operative  11/29/2012  *RADIOLOGY REPORT*  Clinical Data:   Biliary colic.  INTRAOPERATIVE CHOLANGIOGRAM  Technique:  Cholangiographic images from the C-arm fluoroscopic device were submitted for interpretation post-operatively.  Please see the procedural report for the amount of contrast and the fluoroscopy time utilized.  Comparison:  MRI  11/23/2012.  Findings:  Fluoroscopic cine imaging from intraoperative cholangiogram demonstrates a dilated common bile duct.  Poor filling of the intrahepatic ducts.  There is normal drainage of contrast into the duodenum but there is moderate debris in the distal common bile duct which could be a combination of small stones, inflammatory debris or sludge.  No extravasation of contrast is demonstrated.  IMPRESSION:  1.  Dilated distal common bile duct with irregular filling defects, likely a combination of sludge, inflammatory debris or small stones. 2.  Poor opacification of the intrahepatic ducts. 3.  Normal drainage of contrast into the duodenum.   Original Report Authenticated By: Rudie Meyer, M.D.     Assessment / Plan: -RUQ abdominal pain:  Likely from chronic cholecystitis/biliary dyskinesia s/p lap chole yesterday, 3/20.  IOC showed possible sludge/debris in distal CBD, but patient has previous sphincterotomy.  Dr. Rhea Belton spoke with Dr. Gerrit Friends and they agreed that she could just be monitored.  For D/C today if tolerates diet.   LOS: 3 days   Sherre Wooton D.  11/30/2012, 9:12 AM  Pager number [phone]

## 2012-11-30 NOTE — Progress Notes (Signed)
Patient seen, examined, and I agree with the above documentation, including the assessment and plan. Pt feels much better today which is encouraging. Expecting d/c later today She should follow-up with me as needed, check LFTs in 1 month

## 2012-12-03 ENCOUNTER — Encounter (HOSPITAL_COMMUNITY): Payer: Self-pay | Admitting: Surgery

## 2012-12-04 ENCOUNTER — Ambulatory Visit (INDEPENDENT_AMBULATORY_CARE_PROVIDER_SITE_OTHER): Payer: Self-pay | Admitting: Surgery

## 2012-12-06 ENCOUNTER — Telehealth: Payer: Self-pay | Admitting: Internal Medicine

## 2012-12-06 NOTE — Telephone Encounter (Signed)
Pt states she can't pass gas or have a BM; she went and bought enemas. Instructed pt not to take the enema until she checked with her surgeon; advised her to call the Triage nurse at [facility]. Pt stated understanding.

## 2012-12-07 ENCOUNTER — Telehealth (INDEPENDENT_AMBULATORY_CARE_PROVIDER_SITE_OTHER): Payer: Self-pay | Admitting: *Deleted

## 2012-12-07 NOTE — Telephone Encounter (Signed)
Patient called to ask for another kind of nausea medication.  Patient currently has a prescription for Zofran but states even taking it as prescribed she continues to have nausea.  Encouraged patient that her eating some solid food and getting her bowels moving better will also help with the nausea.  Gerkin MD paged to ask about nausea medication.

## 2012-12-07 NOTE — Telephone Encounter (Signed)
Patient called to state that she is only having liquid stools and unable to get a solid stool.  While asking patient about her diet patient states she has been on broths and liquids.  Instructed patient to begin getting some bland foods today to try to get some substance in her system.  Discussed with patient not to eat large meals but to eat bites throughout the day.  Gave patient suggestions on bland foods to begin with.  Patient states understanding and agreeable at this time.

## 2012-12-07 NOTE — Telephone Encounter (Signed)
Spoke to Gerkin MD who gave order for Phenergan 25mg  1 tab every 6 hours as needed for nausea #10 no refill.  Called into [facility] [phone].

## 2012-12-18 ENCOUNTER — Ambulatory Visit (INDEPENDENT_AMBULATORY_CARE_PROVIDER_SITE_OTHER): Payer: BC Managed Care – PPO | Admitting: General Surgery

## 2012-12-18 ENCOUNTER — Encounter (INDEPENDENT_AMBULATORY_CARE_PROVIDER_SITE_OTHER): Payer: Self-pay

## 2012-12-18 VITALS — BP 102/64 | HR 80 | Temp 99.1°F | Resp 18 | Ht 64.5 in | Wt 142.8 lb

## 2012-12-18 DIAGNOSIS — K811 Chronic cholecystitis: Secondary | ICD-10-CM

## 2012-12-18 DIAGNOSIS — Z9889 Other specified postprocedural states: Secondary | ICD-10-CM

## 2012-12-18 DIAGNOSIS — K5900 Constipation, unspecified: Secondary | ICD-10-CM

## 2012-12-18 DIAGNOSIS — Z9049 Acquired absence of other specified parts of digestive tract: Secondary | ICD-10-CM

## 2012-12-18 DIAGNOSIS — R110 Nausea: Secondary | ICD-10-CM

## 2012-12-18 DIAGNOSIS — R109 Unspecified abdominal pain: Secondary | ICD-10-CM

## 2012-12-18 DIAGNOSIS — K5909 Other constipation: Secondary | ICD-10-CM

## 2012-12-18 NOTE — Progress Notes (Signed)
  Subjective: Selena Farrell is a 52 y.o. female who had a laparoscopic cholecystectomy for suspected biliary dyskinesia on 11/29/12 by Dr. Gerrit Friends  returns to the clinic today.  Pathology reveals chronic cholecystitis.  The patient having no severe pain, but c/o nausea and constipation which is chronic  The pre-operative symptoms of abdominal pain, and vomiting have resolved.  No problems with the wounds.  Pt states she still has nausea and isn't able to eat all foods.  She has tried to avoid taking the phenergan.  Pt tolerating a soft diet with soups.  Pt recalls being told that the surgery may not solve all her abdominal issues.  She is seeing a GI specialist as well.     Objective: Vital signs in last 24 hours: Reviewed   PE: General:  Alert, NAD, pleasant Abdomen:  soft, NT/ND, +bs, incisions appear well-healed with no sign of infection or bleeding   Assessment/Plan  1.  S/P Laparoscopic Cholecystectomy for suspected biliary dyskinesia: doing better, may still see some improvements over the next 2-4 weeks.  May resume regular activity without restrictions, can return to work as she should be healed from the surgery at this point.  Only restriction is no lifting > 40lbs until 6 weeks post op and no swimming/hot tubs until 6 weeks.  Pt will follow up with [place] PRN and knows to call with questions or concerns.  Nothing else surgical to offer. 2.  Chronic constipation - make appt with GI/PCP to further investigate these issues and medication management 3.  Chronic nausea - as above    Aris Georgia, PA-C 12/18/2012

## 2012-12-18 NOTE — Patient Instructions (Addendum)
She may resume a regular diet and full activity.  Can return to work as she should be healed from the surgery at this point.  Only restriction is no lifting > 40lbs until 6 weeks post op and no swimming/hot tubs until 6 weeks.  She may follow-up on a PRN basis.  Make appt with your Primary care and/or GI doctor to further investigate your chronic nausea and constipation.

## 2012-12-25 ENCOUNTER — Ambulatory Visit (INDEPENDENT_AMBULATORY_CARE_PROVIDER_SITE_OTHER): Payer: BC Managed Care – PPO | Admitting: Nurse Practitioner

## 2012-12-25 ENCOUNTER — Other Ambulatory Visit (INDEPENDENT_AMBULATORY_CARE_PROVIDER_SITE_OTHER): Payer: BC Managed Care – PPO

## 2012-12-25 ENCOUNTER — Encounter: Payer: Self-pay | Admitting: Nurse Practitioner

## 2012-12-25 ENCOUNTER — Encounter: Payer: Self-pay | Admitting: Internal Medicine

## 2012-12-25 VITALS — BP 110/74 | HR 64 | Ht 62.75 in | Wt 141.1 lb

## 2012-12-25 DIAGNOSIS — R109 Unspecified abdominal pain: Secondary | ICD-10-CM

## 2012-12-25 DIAGNOSIS — K5900 Constipation, unspecified: Secondary | ICD-10-CM

## 2012-12-25 DIAGNOSIS — R1084 Generalized abdominal pain: Secondary | ICD-10-CM | POA: Insufficient documentation

## 2012-12-25 DIAGNOSIS — K649 Unspecified hemorrhoids: Secondary | ICD-10-CM | POA: Insufficient documentation

## 2012-12-25 DIAGNOSIS — R110 Nausea: Secondary | ICD-10-CM

## 2012-12-25 LAB — CBC WITH DIFFERENTIAL/PLATELET
Basophils Relative: 0.4 % (ref 0.0–3.0)
Eosinophils Absolute: 0.1 10*3/uL (ref 0.0–0.7)
HCT: 42.2 % (ref 36.0–46.0)
Hemoglobin: 14.4 g/dL (ref 12.0–15.0)
MCHC: 34.1 g/dL (ref 30.0–36.0)
MCV: 88.3 fl (ref 78.0–100.0)
Monocytes Absolute: 0.9 10*3/uL (ref 0.1–1.0)
Neutro Abs: 10.4 10*3/uL — ABNORMAL HIGH (ref 1.4–7.7)
RBC: 4.78 Mil/uL (ref 3.87–5.11)

## 2012-12-25 MED ORDER — ONDANSETRON HCL 4 MG PO TABS: 4.0000 mg | ORAL_TABLET | Freq: Four times a day (QID) | ORAL | Status: DC | PRN

## 2012-12-25 MED ORDER — HYDROCORTISONE ACETATE 25 MG RE SUPP: 25.0000 mg | Freq: Two times a day (BID) | RECTAL | Status: DC

## 2012-12-25 MED ORDER — PEG-KCL-NACL-NASULF-NA ASC-C 100 G PO SOLR: 1.0000 | Freq: Once | ORAL | Status: DC

## 2012-12-25 NOTE — Progress Notes (Addendum)
12/25/2012 Selena GRAMS [id number] May 30, 1961   History of Present Illness:  Patient is a 52 year old female known to Dr. Rhea Belton. 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 Patient was last seen 11/21/10 when she presented with epigastric / RUQ pain. LFTs done by PCP were normal. An ultrasound suggested bile duct debris but findings not corroborated by subsequent MRCP. Patient then sent for HIDA which revealed abnormal gallbladder filling. Surgery referral was made but patient ended up going to ED where she was admitted by hospitalist and evaluated by [place] as well as surgery. To exclude recurrent PUD patient underwent inpatient EGD. Findings included only mild gastropathy with chronic gastritis on biopsy. No H. Pylori. Selena Farrell underwent a laparoscopic cholecystectomy for suspected biliary dyskinesia on 11/29/12 by Dr. Gerrit Friends. Pathology revealed chronic cholecystitis. She had an unremarkable post-op follow up visit with surgery.   Patient worked in today for evaluation of nausea, diffuse lower abdominal discomfort and constipation. Her nausea and constipation were present prior to cholecystectomy but have gotten worse . A few days after surgery her stools were hard, she had infrequent urges to defecate.  She was started on MiraLax and stools became quite soft but still infrequent and small in volume. Selena Farrell is no longer taking Miralax, didn't feel it was working. As far as the nausea, she is nauseated throughout most of the day. Patient is not taking any narcotics, only Ultram. She has not started any new medications recently which could be contributing to the nausea and constipation. Her weight is down about 4 pounds over the last several months.   Current Medications, Allergies, Past Medical History, Past Surgical History, Family History and Social History were reviewed in [employer] record.   Physical Exam: General:  Well developed , white female in no acute distress Head: Normocephalic and atraumatic Eyes:  sclerae anicteric, conjunctiva pink  Ears: Normal auditory acuity Lungs: Clear throughout to auscultation Heart: Regular rate and rhythm Abdomen: Soft, non tender and non distended. No masses, no hepatomegaly. Normal bowel sounds Rectal: not impacted, no stool in vault. Very large protruding hemorrhoids,   Musculoskeletal: Symmetrical with no gross deformities  Extremities: No edema  Neurological: Alert oriented x 4, grossly nonfocal Psychological:  Alert and cooperative. Normal mood and affect  Assessment and Recommendations:  1. Diffuse lower abdominal pain. Pain could be secondary to constipation. See #2.  2. Constipation. Her stools are reportedly soft but infrequent and small in volume leading to straining. No sure why the constipation, she isn't taking narcotics. Constipation could be functional but mechanical cause such as rectocele not excluded. Patient hasn't had a colonoscopy in many years, she is at age for colon cancer screening. Will schedule colonoscopy with propofol. In the interim I asked her to resume Miralax, increase to BID.  Sample of fiber given. The risks, benefits, and alternatives to colonoscopy with possible biopsy and possible polypectomy were discussed with the patient and she consents to proceed.   2. Nausea, chronic. EGD mid March was unrevealing so repeat would probably be low yield. If nausea does not improve with resolution of constipation then will need further work up. She is not taking narcotics. Will try Zofran.   4. Hemorrhoids, large (? protruding internal). Will treat with Anusol HC suppositories BID for 7-10 days.    Addendum: Reviewed and agree with initial management. Chronic leukocytosis, consider heme referral. Beverley Fiedler, MD

## 2012-12-25 NOTE — Patient Instructions (Addendum)
You have been given a separate informational sheet regarding your tobacco use, the importance of quitting and local resources to help you quit. .  Your physician has requested that you go to the basement for the following lab work before leaving today: CBC.  We have sent the following medications to your pharmacy for you to pick up at your convenience: Zofran, Anusol suppositories.  Start Miralax mixing 17 grams in 8 oz of water twice daily up until the day of your procedure. Also take a fiber supplement once daily until procedure also.   You have been scheduled for a colonoscopy with propofol. Please follow written instructions given to you at your visit today.  Please pick up your prep kit at the pharmacy within the next 1-3 days. If you use inhalers (even only as needed), please bring them with you on the day of your procedure.  cc: Pearson Grippe, MD

## 2012-12-27 ENCOUNTER — Other Ambulatory Visit: Payer: Self-pay

## 2012-12-27 DIAGNOSIS — D72829 Elevated white blood cell count, unspecified: Secondary | ICD-10-CM

## 2012-12-27 NOTE — Progress Notes (Signed)
Hematology appt scheduled with Cherre Huger

## 2013-01-01 ENCOUNTER — Telehealth: Payer: Self-pay | Admitting: Oncology

## 2013-01-01 ENCOUNTER — Ambulatory Visit (AMBULATORY_SURGERY_CENTER): Payer: BC Managed Care – PPO | Admitting: Internal Medicine

## 2013-01-01 ENCOUNTER — Encounter: Payer: Self-pay | Admitting: Internal Medicine

## 2013-01-01 VITALS — BP 149/72 | HR 63 | Temp 98.8°F | Resp 13 | Ht 62.75 in | Wt 141.0 lb

## 2013-01-01 DIAGNOSIS — Z1211 Encounter for screening for malignant neoplasm of colon: Secondary | ICD-10-CM

## 2013-01-01 DIAGNOSIS — D126 Benign neoplasm of colon, unspecified: Secondary | ICD-10-CM

## 2013-01-01 DIAGNOSIS — K5900 Constipation, unspecified: Secondary | ICD-10-CM

## 2013-01-01 DIAGNOSIS — K633 Ulcer of intestine: Secondary | ICD-10-CM

## 2013-01-01 DIAGNOSIS — R109 Unspecified abdominal pain: Secondary | ICD-10-CM

## 2013-01-01 DIAGNOSIS — R110 Nausea: Secondary | ICD-10-CM

## 2013-01-01 DIAGNOSIS — K635 Polyp of colon: Secondary | ICD-10-CM

## 2013-01-01 MED ORDER — METOCLOPRAMIDE HCL 5 MG PO TABS: 5.0000 mg | ORAL_TABLET | Freq: Three times a day (TID) | ORAL | Status: DC

## 2013-01-01 MED ORDER — SODIUM CHLORIDE 0.9 % IV SOLN: 500.0000 mL | INTRAVENOUS | Status: DC

## 2013-01-01 MED ORDER — ONDANSETRON HCL 4 MG/2ML IJ SOLN: 4.0000 mg | Freq: Once | INTRAMUSCULAR | Status: DC

## 2013-01-01 NOTE — Progress Notes (Signed)
Pt reported she has to have something for nausea before any anesthesia.  Zofran given IV.

## 2013-01-01 NOTE — Patient Instructions (Addendum)
YOU HAD AN ENDOSCOPIC PROCEDURE TODAY AT THE [facility]: Refer to the procedure report that was given to you for any specific questions about what was found during the examination.  If the procedure report does not answer your questions, please call your gastroenterologist to clarify.  If you requested that your care partner not be given the details of your procedure findings, then the procedure report has been included in a sealed envelope for you to review at your convenience later.  YOU SHOULD EXPECT: Some feelings of bloating in the abdomen. Passage of more gas than usual.  Walking can help get rid of the air that was put into your GI tract during the procedure and reduce the bloating. If you had a lower endoscopy (such as a colonoscopy or flexible sigmoidoscopy) you may notice spotting of blood in your stool or on the toilet paper. If you underwent a bowel prep for your procedure, then you may not have a normal bowel movement for a few days.  DIET: Your first meal following the procedure should be a light meal and then it is ok to progress to your normal diet.  A half-sandwich or bowl of soup is an example of a good first meal.  Heavy or fried foods are harder to digest and may make you feel nauseous or bloated.  Likewise meals heavy in dairy and vegetables can cause extra gas to form and this can also increase the bloating.  Drink plenty of fluids but you should avoid alcoholic beverages for 24 hours.  ACTIVITY: Your care partner should take you home directly after the procedure.  You should plan to take it easy, moving slowly for the rest of the day.  You can resume normal activity the day after the procedure however you should NOT DRIVE or use heavy machinery for 24 hours (because of the sedation medicines used during the test).    SYMPTOMS TO REPORT IMMEDIATELY: A gastroenterologist can be reached at any hour.  During normal business hours, 8:30 AM to 5:00 PM Monday through Friday,  call [phone].  After hours and on weekends, please call the GI answering service at [phone] who will take a message and have the physician on call contact you.   Following lower endoscopy (colonoscopy or flexible sigmoidoscopy):  Excessive amounts of blood in the stool  Significant tenderness or worsening of abdominal pains  Swelling of the abdomen that is new, acute  Fever of 100F or higher  FOLLOW UP: If any biopsies were taken you will be contacted by phone or by letter within the next 1-3 weeks.  Call your gastroenterologist if you have not heard about the biopsies in 3 weeks.  Our staff will call the home number listed on your records the next business day following your procedure to check on you and address any questions or concerns that you may have at that time regarding the information given to you following your procedure. This is a courtesy call and so if there is no answer at the home number and we have not heard from you through the emergency physician on call, we will assume that you have returned to your regular daily activities without incident.  SIGNATURES/CONFIDENTIALITY: You and/or your care partner have signed paperwork which will be entered into your electronic medical record.  These signatures attest to the fact that that the information above on your After Visit Summary has been reviewed and is understood.  Full responsibility of the confidentiality of this   discharge information lies with you and/or your care-partner.  Dr. Lauro Franklin office is going to set you up for a gastric emptying study.  They will be contacting you with details about the time, locations, and any further instructions-  Do not take Metoclopramide and any narcotics 48 hours before this test- your prescription was sent to [facility] [address]  AVOID NSAIDS (ALEVE, IBUPROFEN, ADVIL)  AWAIT PATHOLOGY RESULTS  TRIAL OF METOCLOPRAMIDE 5MG - CAN 1 TO 2  TABLETS THREE TIMES DAILY BEFORE MEALS AND  AT BEDTIME.  TAKE LOWEST EFFECTIVE DOSE

## 2013-01-01 NOTE — Progress Notes (Signed)
Called to room to assist during endoscopic procedure.  Patient ID and intended procedure confirmed with present staff. Received instructions for my participation in the procedure from the performing physician.  

## 2013-01-01 NOTE — Op Note (Signed)
[facility] [address] [place], [address]   COLONOSCOPY PROCEDURE REPORT  PATIENT: Selena Farrell, Selena Farrell  MR#: [id number] BIRTHDATE: [birth year] , 51  yrs. old GENDER: Female ENDOSCOPIST: Beverley Fiedler, MD PROCEDURE DATE:  01/01/2013 PROCEDURE:   Colonoscopy with biopsy and Colonoscopy with snare polypectomy ASA CLASS:   Class II INDICATIONS:average risk screening, Change in bowel habits, and Abdominal pain. MEDICATIONS: MAC sedation, administered by CRNA and propofol (Diprivan) 300mg  IV  DESCRIPTION OF PROCEDURE:   After the risks benefits and alternatives of the procedure were thoroughly explained, informed consent was obtained.  A digital rectal exam revealed external hemorrhoids.   The LB CF-[id number]  endoscope was introduced through the anus and advanced to the terminal ileum which was intubated for a short distance. No adverse events experienced. The quality of the prep was Moviprep fair  The instrument was then slowly withdrawn as the colon was fully examined.     COLON FINDINGS: The mucosa appeared normal in the terminal ileum. A 5 mm ulcer was found at the ileocecal valve.  Multiple biopsies of the area were performed using cold forceps.   Two sessile polyps measuring 3 and 6 mm in size were found in the rectosigmoid colon. Polypectomy was performed using cold snare.  All resections were complete and all polyp tissue was completely retrieved.   Large external hemorrhoids were found.   The colon mucosa was otherwise normal without evidence of inflammation or colitis.  Retroflexed views revealed external hemorrhoids. The time to cecum=2 minutes 45 seconds.  Withdrawal time=17 minutes 00 seconds.  The scope was withdrawn and the procedure completed.  COMPLICATIONS: There were no complications.     ENDOSCOPIC IMPRESSION: 1.   Normal mucosa in the terminal ileum 2.   Ulcer at the ileocecal valve; multiple biopsies of the area were performed  using cold forceps 3.   Two sessile polyps measuring 3 and 6 mm in size were found in the rectosigmoid colon; Polypectomy was performed using cold snare 4.   Large external hemorrhoids 5.   The colon mucosa was otherwise normal  RECOMMENDATIONS: 1.  Await pathology results 2.  If the polyps removed today are proven to be adenomatous (pre-cancerous) polyps, you will need a repeat colonoscopy in 5 years.  Otherwise you should continue to follow colorectal cancer screening guidelines for "routine risk" patients with colonoscopy in 10 years.  You will receive a letter within 1-2 weeks with the results of your biopsy as well as final recommendations.  Please call my office if you have not received a letter after 3 weeks. 3.  Avoid NSAIDs 4.  Schedule gastric emptying study 5.  Trial of metoclopramide 5 mg.  Can take 1 to 2 tablets three times daily before meals and at bedtime for nausea or abdominal fullness.  Take lowest effective dose.  Do not take for 48 hours before gastric emptying test (also no narcotic pain medications for 48 hours before gastric emptying test)   eSigned:  Beverley Fiedler, MD 01/01/2013 9:08 AM   cc: Pearson Grippe, MD and The Patient   PATIENT NAME:  Selena Farrell, Selena Farrell MR#: [id number]

## 2013-01-01 NOTE — Progress Notes (Signed)
Lidocaine-40mg IV prior to Propofol InductionPropofol given over incremental dosages 

## 2013-01-01 NOTE — Telephone Encounter (Signed)
LVOM FOR PT TO RETURN CALL IN RE TO APPT.  °

## 2013-01-01 NOTE — Progress Notes (Signed)
4034- upon arrival to the RR, pt states she is having pain.  Abdominal pain, rating as a "7."  She is passing gas and her abdomen is soft.  She states she came in with this pain and it is not different.  She also c/o back pain, stating it hurts in the "left kidney area.":  This pain is new and she rates it as a "3."  Pillow placed behind back for support.  Also, c/o nausea immediately.  She was given Zofran IV in the admitting area.  She states she is "slightly more nauseated than before."

## 2013-01-01 NOTE — Progress Notes (Addendum)
911- pt resting with eyes closed at this time  0936- pt has no c/o pain or discomfort at discharge  Patient did not experience any of the following events: a burn prior to discharge; a fall within the facility; wrong site/side/patient/procedure/implant event; or a hospital transfer or hospital admission upon discharge from the facility. ([id number])Patient did not have preoperative order for IV antibiotic SSI prophylaxis. [phone])

## 2013-01-02 ENCOUNTER — Telehealth: Payer: Self-pay | Admitting: *Deleted

## 2013-01-02 NOTE — Telephone Encounter (Signed)
  Follow up Call-  Call back number 01/01/2013  Post procedure Call Back phone  # [phone]  Permission to leave phone message Yes     No answer, left message.

## 2013-01-04 ENCOUNTER — Telehealth: Payer: Self-pay | Admitting: Internal Medicine

## 2013-01-04 DIAGNOSIS — R194 Change in bowel habit: Secondary | ICD-10-CM

## 2013-01-04 DIAGNOSIS — R110 Nausea: Secondary | ICD-10-CM

## 2013-01-04 DIAGNOSIS — R109 Unspecified abdominal pain: Secondary | ICD-10-CM

## 2013-01-04 NOTE — Telephone Encounter (Signed)
Informed pt we have not received bx results. We have scheduled her GES for 01/17/13 at 1pm; she needs to arrive at [facility] radiology at 12:45pm, no stomach meds for 48hrs and NPO for 6 hours. Mailed pt instructions; pt stated understanding.

## 2013-01-07 ENCOUNTER — Telehealth: Payer: Self-pay | Admitting: Internal Medicine

## 2013-01-07 ENCOUNTER — Encounter: Payer: Self-pay | Admitting: Internal Medicine

## 2013-01-07 NOTE — Telephone Encounter (Signed)
lmom for pt to call back

## 2013-01-07 NOTE — Telephone Encounter (Signed)
Pt states Dr Rhea Belton suggested she not return to work until after all her testing and appts are in. She has a GES on 01/17/13 and then she has an appt at the [facility] on 01/24/13; she would like to return to work on 01/28/13. Please fax to [phone]. Done

## 2013-01-16 ENCOUNTER — Other Ambulatory Visit: Payer: Self-pay | Admitting: Internal Medicine

## 2013-01-17 ENCOUNTER — Encounter (HOSPITAL_COMMUNITY)
Admission: RE | Admit: 2013-01-17 | Discharge: 2013-01-17 | Disposition: A | Discharge status: Home / Self Care | Payer: BC Managed Care – PPO | Source: Ambulatory Visit | Attending: Internal Medicine | Admitting: Internal Medicine

## 2013-01-17 ENCOUNTER — Encounter (HOSPITAL_COMMUNITY): Payer: Self-pay

## 2013-01-17 DIAGNOSIS — R109 Unspecified abdominal pain: Secondary | ICD-10-CM | POA: Insufficient documentation

## 2013-01-17 DIAGNOSIS — R110 Nausea: Secondary | ICD-10-CM | POA: Insufficient documentation

## 2013-01-17 DIAGNOSIS — R198 Other specified symptoms and signs involving the digestive system and abdomen: Secondary | ICD-10-CM | POA: Insufficient documentation

## 2013-01-17 DIAGNOSIS — R194 Change in bowel habit: Secondary | ICD-10-CM

## 2013-01-17 MED ORDER — TECHNETIUM TC 99M SULFUR COLLOID
2.1000 | Freq: Once | INTRAVENOUS | Status: AC | PRN
  Administered 2013-01-17: 2.1 via INTRAVENOUS

## 2013-01-18 ENCOUNTER — Encounter: Payer: Self-pay | Admitting: Internal Medicine

## 2013-01-22 ENCOUNTER — Ambulatory Visit: Payer: BC Managed Care – PPO | Admitting: Internal Medicine

## 2013-01-23 ENCOUNTER — Encounter: Payer: Self-pay | Admitting: Internal Medicine

## 2013-01-24 ENCOUNTER — Other Ambulatory Visit (HOSPITAL_BASED_OUTPATIENT_CLINIC_OR_DEPARTMENT_OTHER): Payer: BC Managed Care – PPO | Admitting: Lab

## 2013-01-24 ENCOUNTER — Encounter: Payer: Self-pay | Admitting: Oncology

## 2013-01-24 ENCOUNTER — Telehealth: Payer: Self-pay | Admitting: Oncology

## 2013-01-24 ENCOUNTER — Ambulatory Visit (HOSPITAL_BASED_OUTPATIENT_CLINIC_OR_DEPARTMENT_OTHER): Payer: BC Managed Care – PPO | Admitting: Oncology

## 2013-01-24 ENCOUNTER — Ambulatory Visit (INDEPENDENT_AMBULATORY_CARE_PROVIDER_SITE_OTHER): Payer: BC Managed Care – PPO | Admitting: Internal Medicine

## 2013-01-24 ENCOUNTER — Encounter: Payer: Self-pay | Admitting: Internal Medicine

## 2013-01-24 ENCOUNTER — Ambulatory Visit: Payer: BC Managed Care – PPO

## 2013-01-24 VITALS — BP 159/81 | HR 62 | Temp 98.0°F | Resp 18 | Ht 62.75 in | Wt 141.3 lb

## 2013-01-24 VITALS — BP 114/72 | HR 64 | Ht 62.75 in | Wt 141.4 lb

## 2013-01-24 DIAGNOSIS — K834 Spasm of sphincter of Oddi: Secondary | ICD-10-CM

## 2013-01-24 DIAGNOSIS — D72829 Elevated white blood cell count, unspecified: Secondary | ICD-10-CM

## 2013-01-24 DIAGNOSIS — K3184 Gastroparesis: Secondary | ICD-10-CM

## 2013-01-24 DIAGNOSIS — R109 Unspecified abdominal pain: Secondary | ICD-10-CM

## 2013-01-24 DIAGNOSIS — K219 Gastro-esophageal reflux disease without esophagitis: Secondary | ICD-10-CM

## 2013-01-24 DIAGNOSIS — R110 Nausea: Secondary | ICD-10-CM

## 2013-01-24 LAB — CBC WITH DIFFERENTIAL/PLATELET
Eosinophils Absolute: 0.2 10*3/uL (ref 0.0–0.5)
HCT: 42.3 % (ref 34.8–46.6)
LYMPH%: 27.5 % (ref 14.0–49.7)
MONO#: 1.1 10*3/uL — ABNORMAL HIGH (ref 0.1–0.9)
NEUT#: 9.5 10*3/uL — ABNORMAL HIGH (ref 1.5–6.5)
NEUT%: 63.7 % (ref 38.4–76.8)
Platelets: 353 10*3/uL (ref 145–400)
WBC: 15 10*3/uL — ABNORMAL HIGH (ref 3.9–10.3)

## 2013-01-24 LAB — CHCC SMEAR

## 2013-01-24 NOTE — Progress Notes (Signed)
[facility]  Telephone:([phone]) [phone] Fax:(336) [phone]     INITIAL HEMATOLOGY CONSULTATION    Referral MD:  Carie Caddy. Pyrtle, M.D.  Reason for Referral: Neutrophil predominant leukocytosis.     HPI:  [name] is 52 year-old woman with multiple medical issues.  Of note, she has been having GERD, peptic ulcer, Hpylori.  She has been seeing Dr. Rhea Belton from GI.  He noted that she has been having leukocytosis.  Therefore, she was kindly referred to the [facility] for evaluation.   Selena Farrell presented to the Clinic for the first time today with her twin sister.  She reports that her abdominal pain in the RUQ has significantly improved over the last few weeks.  Her appetite is starting to come back.  She is slowly regaining her lost weight.  She denied fever, anorexia, weight loss, fatigue, headache, visual changes, confusion, drenching night sweats, palpable lymph node swelling, mucositis, odynophagia, dysphagia, nausea vomiting, jaundice, chest pain, palpitation, shortness of breath, dyspnea on exertion, productive cough, gum bleeding, epistaxis, hematemesis, hemoptysis, abdominal pain, abdominal swelling, early satiety, melena, hematochezia, hematuria, skin rash, spontaneous bleeding, joint swelling, joint pain, heat or cold intolerance, bowel bladder incontinence, back pain, focal motor weakness, paresthesia.    Past Medical History  Diagnosis Date  . Hypertension   . Gastritis 05/2011    EGD-Dr. Rhea Belton   . Gastric ulcer 05/2011    EGD-Dr. Rhea Belton   . Depression   . Nicotine dependence   . Alcohol dependence     Quit Sept. 2012   . Helicobacter pylori (H. pylori) infection     Hx of   . External hemorrhoid   . Delayed gastric emptying   :    Past Surgical History  Procedure Laterality Date  . Appendectomy    . Cesarean section      x1  . Esophagogastroduodenoscopy N/A 11/28/2012    Procedure: ESOPHAGOGASTRODUODENOSCOPY (EGD);  Surgeon: Beverley Fiedler, MD;  Location: [facility];  Service: Gastroenterology;  Laterality: N/A;  . Cholecystectomy N/A 11/29/2012    Procedure: LAPAROSCOPIC CHOLECYSTECTOMY WITH INTRAOPERATIVE CHOLANGIOGRAM;  Surgeon: Velora Heckler, MD;  Location: [facility];  Service: General;  Laterality: N/A;  . Cervical neck fusion    :   CURRENT MEDS: Current Outpatient Prescriptions  Medication Sig Dispense Refill  . atenolol (TENORMIN) 25 MG tablet TAKE 1 TABLET (25 MG TOTAL) BY MOUTH DAILY.  30 tablet  1  . citalopram (CELEXA) 20 MG tablet Take 20 mg by mouth daily.      Marland Kitchen HYDROcodone-acetaminophen (NORCO/VICODIN) 5-325 MG per tablet Take 1 tablet by mouth every 6 (six) hours as needed for pain.  45 tablet  0  . LORazepam (ATIVAN) 0.5 MG tablet Take 1 tablet (0.5 mg total) by mouth every 6 (six) hours as needed. For anxiety  30 tablet  0  . metoCLOPramide (REGLAN) 5 MG tablet TAKE 1-2 TABLETS (5-10 MG TOTAL) BY MOUTH 4 (FOUR) TIMES DAILY - BEFORE MEALS AND AT BEDTIME.  60 tablet  0  . ondansetron (ZOFRAN) 4 MG tablet Take 1 tablet (4 mg total) by mouth every 6 (six) hours as needed for nausea.  60 tablet  0  . pantoprazole (PROTONIX) 40 MG tablet Take 1 tablet (40 mg total) by mouth daily.  30 tablet  0  . polyethylene glycol (MIRALAX) packet Take 17 g by mouth daily.  14 each  0  . temazepam (RESTORIL) 15 MG capsule Take 30 mg by  mouth at bedtime as needed. For sleep      . traMADol (ULTRAM) 50 MG tablet TAKE 1 TABLET (50 MG TOTAL) BY MOUTH EVERY 6 (SIX) HOURS AS NEEDED FOR PAIN.  90 tablet  1  . traZODone (DESYREL) 50 MG tablet Take 100 mg by mouth at bedtime as needed.        No current facility-administered medications for this visit.      Allergies  Allergen Reactions  . Aspirin Other (See Comments)    Pt has bleeding ulcer, not allowed to take aspirin  . Vicodin (Hydrocodone-Acetaminophen) Nausea And Vomiting  :  Family History  Problem Relation Age of Onset  . Colon cancer Neg Hx   . Alcohol abuse  Mother   . Stroke Father   . Cancer Maternal Grandfather     prostate  :  History   Social History  . Marital Status: Widowed    Spouse Name: N/A    Number of Children: 1  . Years of Education: N/A   Occupational History  .  [employer]   Social History Main Topics  . Smoking status: Current Every Day Smoker -- 0.50 packs/day for 25 years    Types: Cigarettes  . Smokeless tobacco: Never Used     Comment: Counseling to quit smoking given to patient in exam room   . Alcohol Use: No     Comment: Heavy drinker quit  Sept 2012   . Drug Use: No  . Sexually Active: No   Other Topics Concern  . Not on file   Social History Narrative  . No narrative on file  :  REVIEW OF SYSTEM:  The rest of the 14-point review of sytem was negative.   Exam: ECOG 1  General: thin-appearing woman,  in no acute distress.  Eyes:  no scleral icterus.  ENT:  There were no oropharyngeal lesions.  Neck was without thyromegaly.  Lymphatics:  Negative cervical, supraclavicular or axillary adenopathy.  Respiratory: lungs were clear bilaterally without wheezing or crackles.  Cardiovascular:  Regular rate and rhythm, S1/S2, without murmur, rub or gallop.  There was no pedal edema.  GI:  abdomen was soft, flat, nontender, nondistended, without organomegaly.  Muscoloskeletal:  no spinal tenderness of palpation of vertebral spine.  Skin exam was without echymosis, petichae.  Neuro exam was nonfocal.  Patient was able to get on and off exam table without assistance.  Gait was normal.  Patient was alert and oriented.  Attention was good.   Language was appropriate.  Mood was normal without depression.  Speech was not pressured.  Thought content was not tangential.    LABS:  Lab Results  Component Value Date   WBC 15.0* 01/24/2013   HGB 14.2 01/24/2013   HCT 42.3 01/24/2013   PLT 353 01/24/2013   GLUCOSE 76 11/29/2012   CHOL 168 05/21/2011   TRIG 70 05/21/2011   HDL 67 05/21/2011   LDLCALC  87 05/21/2011   ALT 29 11/27/2012   AST 29 11/27/2012   NA 139 11/29/2012   K 3.6 11/29/2012   CL 105 11/29/2012   CREATININE 0.77 11/29/2012   BUN 6 11/29/2012   CO2 25 11/29/2012   INR 0.97 11/27/2012     Blood smear review:   I personally reviewed the patient's peripheral blood smear today.  There was isocytosis.  There was no peripheral blast.  There was no schistocytosis, spherocytosis, target cell, rouleaux formation, tear drop cell.  There was no giant platelets or platelet clumps.      ASSESSMENT AND PLAN:   Issue:  Chronic neutrophil-predominant leukocytosis:  Differential:  Most likely reactive.  I have low clinical suspicion for primary bone marrow issue such as myeloproliferative or lymphoproliferative disease as she has no B-symptoms.  I need to rule out CML with BCR/ABL.     Work up:  Diagnostic bone marrow biopsy at this time has low clinical utility.    Recommendation:  Watchful monitoring of CBC.  In the future, if her leukocytosis significantly worsens, or she also develops anemia or thrombocytopenia, then we will proceed with diagnostic bone marrow biopsy at that time.  Ms. Purdum and her sister expressed informed understanding and agreed with monitoring at this time.  Follow up:  CBC at the Cancer Center in about 3 months.  Return to clinic in about 6 months.    The length of time of the face-to-face encounter was 30 minutes. More than 50% of time was spent counseling and coordination of care.   I informed Ms. Allor that I am leaving the practice.  The Cancer Center will arrange for her to see a new provider when she returns.   Thank you for this referral.      Huan T. Gaylyn Rong, M.D.

## 2013-01-24 NOTE — Telephone Encounter (Signed)
Gave pt appt for lab and MD on August Aand November 2014

## 2013-01-24 NOTE — Progress Notes (Signed)
Checked in new patient. No financial issues. °

## 2013-01-24 NOTE — Progress Notes (Signed)
Subjective:    Patient ID: Selena Farrell, female    DOB: 1961-02-10, 52 y.o.   MRN: [id number]  HPI Mrs. Dower [name]) is a 52 year old female with a past medical history of peptic ulcer disease related H. 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  She did well after her cholecystectomy, but continued to have abdominal issues with both constipation and upper abdominal pain, particularly in the postprandial setting. Colonoscopy was performed on 01/01/2013 which revealed an ulcer at her ileocecal valve which biopsies proved to be benign with no evidence of dysplasia or malignancy. She also had 2 rectosigmoid polyps which were hyperplastic. Large internal hemorrhoids were seen. Also the mucosa in the terminal ileum was normal. She underwent a gastric emptying scan earlier this month which revealed delayed gastric emptying. It should be noted this was performed off of all narcotic pain medications. She did start metoclopramide 5-10 mg recently. Today she reports that she is significantly better when using the Reglan. She is not using this 4 times daily, but seems to benefit from 10 mg versus 5 mg. She is using this before large meals and at bedtime. Again, her upper abdominal pain, bloating, and nausea have improved. Her constipation is also better and she remains on MiraLax.  Her brother passed away last February 19, 2023 unexpectedly at age 17. This is obviously causes significant amount of stress and grief.  She has an appointment later today with hematology to evaluate her chronic persistent leukocytosis   Review of Systems As per history of present illness, otherwise negative  Current Medications, Allergies, Past Medical History, Past Surgical History, Family History and Social History were reviewed in [employer]  record.     Objective:   Physical Exam BP 114/72  Pulse 64  Ht 5' 2.75" (1.594 m)  Wt 141 lb 6 oz (64.127 kg)  BMI 25.24 kg/m2 Constitutional: Well-developed and well-nourished. No distress. HEENT: Normocephalic and atraumatic.No scleral icterus. Neck: Neck supple. Trachea midline. Cardiovascular: Normal rate, regular rhythm and intact distal pulses. Pulmonary/chest: Effort normal and breath sounds normal. No wheezing, rales or rhonchi. Abdominal: Soft, nontender, nondistended. Bowel sounds active throughout. There are no masses palpable. No hepatosplenomegaly. Extremities: no clubbing, cyanosis, or edema Neurological: Alert and oriented to person place and time. Psychiatric: Normal mood and affect. Behavior is normal.  NUCLEAR MEDICINE GASTRIC EMPTYING SCAN 01/17/13   Technique:  After oral ingestion of radiolabeled meal, sequential abdominal images were obtained for 120 minutes.  Residual percentage of activity remaining within the stomach was calculated at 60 and 120 minutes.   Radiopharmaceutical:  2.1 mCi Tc-74m sulfur colloid.   Comparison:  None.   Findings: The 60 minutes following the per oral ingestion of the radiopharmaceutical there is a 84% radiotracer activity remaining in the stomach.  After 120 minutes there is 30% radiotracer activity remaining in the stomach.   Normal retention is less than 30% at 120 minutes.   IMPRESSION:   1.  Delayed gastric emptying.  CBC    Component Value Date/Time   WBC 15.1* 12/25/2012 1028   RBC 4.78 12/25/2012 1028   HGB 14.4 12/25/2012 1028   HCT 42.2 12/25/2012 1028   PLT 388.0 12/25/2012 1028   MCV 88.3 12/25/2012 1028   MCH 28.9 11/29/2012 0421   MCHC 34.1 12/25/2012 1028   RDW 13.9 12/25/2012 1028   LYMPHSABS 3.7 12/25/2012 1028   MONOABS  0.9 12/25/2012 1028   EOSABS 0.1 12/25/2012 1028   BASOSABS 0.1 12/25/2012 1028       Assessment & Plan:  52 year old female with a past medical history of peptic ulcer disease related  H. 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  1.  Gastroparesis -- I do think she has an element of gastroparesis as indicated by the gastric emptying study, and this is exacerbated by the use of narcotic pain medication. She is not using narcotics on a daily basis, but I'm sure when she does her gastroparesis is worse. She seems to be benefiting from metoclopramide, and we had a long discussion today regarding this medication and its long-term side effects and risk profile. We discussed the possibility of her long-term use of neurologic complications including tardive dyskinesia. I would like her to use the lowest effective dose, skip doses whenever possible, and use this for as short of time as possible.  If we find that she needs this on a more regular basis, we will consider domperidone. If domperidone is started she will need EKG prior.    2.  GERD/history of PUD -- not an active issue for now. She continues on pantoprazole 40 mg daily  3.  history of papillary stenosis/SOD -- I will check LFTs today, but previously they have normalized. She is status post biliary sphincterotomy  4.  Chronic persistent leukocytosis -- I agree completely with hematology evaluation which is pending for today  5.  Constipation -- she'll continue with MiraLax 17 g daily. Narcotics are likely exacerbating this issue as well.  6.  Colon polyps -- hyperplastic, and she will be due repeat colonoscopy in 10 years for screening

## 2013-01-24 NOTE — Patient Instructions (Addendum)
You have been given a separate informational sheet regarding your tobacco use, the importance of quitting and local resources to help you quit.  Please hand the orders you have been given to the Phelobotomist at Dr. Lodema Pilot office for the blood work Dr. Rhea Belton has ordered. They will send [place] those results.  Continue taking Reglas as needed, skip doses if possible.                                                We are excited to introduce MyChart, a new best-in-class service that provides you online access to important information in your electronic medical record. We want to make it easier for you to view your health information - all in one secure location - when and where you need it. We expect MyChart will enhance the quality of care and service we provide.  When you register for MyChart, you can:    View your test results.    Request appointments and receive appointment reminders via email.    Request medication renewals.    View your medical history, allergies, medications and immunizations.    Communicate with your physician's office through a password-protected site.    Conveniently print information such as your medication lists.  To find out if MyChart is right for you, please talk to a member of our clinical staff today. We will gladly answer your questions about this free health and wellness tool.  If you are age 53 or older and want a member of your family to have access to your record, you must provide written consent by completing a proxy form available at our office. Please speak to our clinical staff about guidelines regarding accounts for patients younger than age 59.  As you activate your MyChart account and need any technical assistance, please call the MyChart technical support line at [phone]) or email your question to [email or web address]. If you email your question(s), please include your name, a return phone number and the best time to reach  you.  If you have non-urgent health-related questions, you can send a message to our office through MyChart at [email or web address]. If you have a medical emergency, call 911.  Thank you for using MyChart as your new health and wellness resource!   MyChart licensed from [employer],  [id number]. Patents Pending.      Marland Kitchen

## 2013-01-29 ENCOUNTER — Telehealth: Payer: Self-pay | Admitting: Internal Medicine

## 2013-01-29 ENCOUNTER — Other Ambulatory Visit: Payer: Self-pay | Admitting: Internal Medicine

## 2013-01-29 ENCOUNTER — Other Ambulatory Visit: Payer: Self-pay | Admitting: Gastroenterology

## 2013-01-29 DIAGNOSIS — R110 Nausea: Secondary | ICD-10-CM

## 2013-01-29 MED ORDER — METOCLOPRAMIDE HCL 10 MG PO TABS: 10.0000 mg | ORAL_TABLET | Freq: Three times a day (TID) | ORAL | Status: DC

## 2013-01-29 NOTE — Telephone Encounter (Signed)
Pt was given an Rx for #60 on 01-16-2013 too soon for a refill

## 2013-01-29 NOTE — Telephone Encounter (Signed)
Called pt to tell her Dr. Rhea Belton changed the strength of her Reglan and sent Rx into her Pharmacy. I called twice, phone rings then goes silent, no one picks up and no VM.

## 2013-01-31 ENCOUNTER — Encounter: Payer: Self-pay | Admitting: Oncology

## 2013-02-01 ENCOUNTER — Telehealth: Payer: Self-pay | Admitting: Gastroenterology

## 2013-02-01 NOTE — Telephone Encounter (Signed)
Spoke to pt. Told her all labs were within normal limits. Pt was please with that. Also asked for a new work note, she only went back part time this week and will go back full time next week. Told her I will fax it over to her employer [phone]

## 2013-02-05 ENCOUNTER — Telehealth: Payer: Self-pay | Admitting: Internal Medicine

## 2013-02-05 NOTE — Telephone Encounter (Signed)
Spoke to [employer], said work did not get the fax number I sent last week, i confirmed the fax # [phone] and resent it.

## 2013-02-06 ENCOUNTER — Telehealth: Payer: Self-pay | Admitting: Dietician

## 2013-02-07 ENCOUNTER — Other Ambulatory Visit: Payer: Self-pay | Admitting: Internal Medicine

## 2013-02-14 ENCOUNTER — Encounter: Payer: Self-pay | Admitting: Internal Medicine

## 2013-04-26 ENCOUNTER — Other Ambulatory Visit: Payer: Self-pay | Admitting: Hematology and Oncology

## 2013-04-26 ENCOUNTER — Other Ambulatory Visit: Payer: BC Managed Care – PPO | Admitting: Lab

## 2013-04-26 DIAGNOSIS — D72829 Elevated white blood cell count, unspecified: Secondary | ICD-10-CM

## 2013-04-29 ENCOUNTER — Other Ambulatory Visit: Payer: Self-pay | Admitting: *Deleted

## 2013-04-29 ENCOUNTER — Telehealth: Payer: Self-pay | Admitting: Hematology and Oncology

## 2013-04-29 ENCOUNTER — Emergency Department (HOSPITAL_COMMUNITY): Payer: BC Managed Care – PPO

## 2013-04-29 ENCOUNTER — Emergency Department (HOSPITAL_COMMUNITY)
Admission: EM | Admit: 2013-04-29 | Discharge: 2013-04-30 | Disposition: A | Discharge status: Home / Self Care | Payer: BC Managed Care – PPO | Source: Home / Self Care | Attending: Emergency Medicine | Admitting: Emergency Medicine

## 2013-04-29 ENCOUNTER — Encounter (HOSPITAL_COMMUNITY): Payer: Self-pay | Admitting: Emergency Medicine

## 2013-04-29 DIAGNOSIS — K2970 Gastritis, unspecified, without bleeding: Secondary | ICD-10-CM

## 2013-04-29 LAB — BASIC METABOLIC PANEL
Calcium: 9.8 mg/dL (ref 8.4–10.5)
Creatinine, Ser: 0.71 mg/dL (ref 0.50–1.10)
GFR calc Af Amer: >90 mL/min (ref >90)
Sodium: 136 mEq/L (ref 135–145)

## 2013-04-29 LAB — PRO B NATRIURETIC PEPTIDE: Pro B Natriuretic peptide (BNP): 156 pg/mL — ABNORMAL HIGH (ref 0–125)

## 2013-04-29 LAB — CBC
MCH: 30.8 pg (ref 26.0–34.0)
MCV: 85.4 fL (ref 78.0–100.0)
Platelets: 369 10*3/uL (ref 150–400)
RDW: 12.6 % (ref 11.5–15.5)
WBC: 12.7 10*3/uL — ABNORMAL HIGH (ref 4.0–10.5)

## 2013-04-29 MED ORDER — HYDROMORPHONE HCL PF 1 MG/ML IJ SOLN
1.0000 mg | Freq: Once | INTRAMUSCULAR | Status: AC
  Administered 2013-04-29: 1 mg via INTRAVENOUS
  Filled 2013-04-29: qty 1

## 2013-04-29 MED ORDER — ONDANSETRON HCL 4 MG/2ML IJ SOLN
4.0000 mg | Freq: Once | INTRAMUSCULAR | Status: AC
  Administered 2013-04-29: 4 mg via INTRAVENOUS
  Filled 2013-04-29: qty 2

## 2013-04-29 MED ORDER — GI COCKTAIL ~~LOC~~
30.0000 mL | Freq: Once | ORAL | Status: AC
  Administered 2013-04-29: 30 mL via ORAL
  Filled 2013-04-29: qty 30

## 2013-04-29 NOTE — ED Notes (Signed)
Pt stated the last time she felt this way pt had H. Pylori and ulcers of the stomach.

## 2013-04-29 NOTE — Telephone Encounter (Signed)
lvm for pt regarding the 8.22.14 lab only appt

## 2013-04-29 NOTE — ED Notes (Signed)
Pt stated she could not give a urine sample at this time.

## 2013-04-29 NOTE — ED Provider Notes (Signed)
CSN: [id number]     Arrival date & time 04/29/13  2045 History     First MD Initiated Contact with Patient 04/29/13 2305     Chief Complaint  Patient presents with  . Abdominal Pain   (Consider location/radiation/quality/duration/timing/severity/associated sxs/prior Treatment) Patient is a 52 y.o. female presenting with abdominal pain.  Abdominal Pain Pain location:  Epigastric Pain quality: burning   Pain radiates to:  Chest Pain severity:  Severe Onset quality:  Gradual Duration:  1 week (worse today) Timing:  Constant Progression:  Worsening Chronicity:  Recurrent Context comment:  Ran out of Protonix a few days ago Relieved by:  Nothing Worsened by:  Movement and vomiting Ineffective treatments:  None tried Associated symptoms: vomiting   Associated symptoms: no cough, no diarrhea, no fever, no nausea and no shortness of breath     Past Medical History  Diagnosis Date  . Hypertension   . Gastritis 05/2011    EGD-Dr. Rhea Belton   . Gastric ulcer 05/2011    EGD-Dr. Rhea Belton   . Depression   . Nicotine dependence   . Alcohol dependence     Quit Sept. 2012   . Helicobacter pylori (H. pylori) infection     Hx of   . External hemorrhoid   . Delayed gastric emptying    Past Surgical History  Procedure Laterality Date  . Appendectomy    . Cesarean section      x1  . Esophagogastroduodenoscopy N/A 11/28/2012    Procedure: ESOPHAGOGASTRODUODENOSCOPY (EGD);  Surgeon: Beverley Fiedler, MD;  Location: [facility];  Service: Gastroenterology;  Laterality: N/A;  . Cholecystectomy N/A 11/29/2012    Procedure: LAPAROSCOPIC CHOLECYSTECTOMY WITH INTRAOPERATIVE CHOLANGIOGRAM;  Surgeon: Velora Heckler, MD;  Location: [facility];  Service: General;  Laterality: N/A;  . Cervical neck fusion     Family History  Problem Relation Age of Onset  . Colon cancer Neg Hx   . Alcohol abuse Mother   . Stroke Father   . Cancer Maternal Grandfather     prostate   History  Substance Use Topics  .  Smoking status: Current Every Day Smoker -- 0.50 packs/day for 25 years    Types: Cigarettes  . Smokeless tobacco: Never Used     Comment: Counseling to quit smoking given to patient in exam room   . Alcohol Use: No     Comment: Heavy drinker quit  Sept 2012    OB History   Grav Para Term Preterm Abortions TAB SAB Ect Mult Living                 Review of Systems  Constitutional: Negative for fever.  HENT: Negative for congestion.   Respiratory: Negative for cough and shortness of breath.   Gastrointestinal: Positive for vomiting and abdominal pain. Negative for nausea and diarrhea.  All other systems reviewed and are negative.    Allergies  Aspirin and Vicodin  Home Medications   Current Outpatient Rx  Name  Route  Sig  Dispense  Refill  . atenolol (TENORMIN) 25 MG tablet   Oral   Take 25 mg by mouth daily.         . citalopram (CELEXA) 20 MG tablet   Oral   Take 20 mg by mouth daily.         Marland Kitchen levonorgestrel (MIRENA) 20 MCG/24HR IUD   Intrauterine   1 each by Intrauterine route once.         Marland Kitchen LORazepam (ATIVAN) 0.5 MG tablet  Oral   Take 1 tablet (0.5 mg total) by mouth every 6 (six) hours as needed. For anxiety   30 tablet   0   . metoCLOPramide (REGLAN) 10 MG tablet   Oral   Take 1 tablet (10 mg total) by mouth 4 (four) times daily -  before meals and at bedtime.   60 tablet   0   . temazepam (RESTORIL) 15 MG capsule   Oral   Take 30 mg by mouth at bedtime as needed for sleep.          . traMADol (ULTRAM) 50 MG tablet   Oral   Take 50 mg by mouth every 6 (six) hours as needed for pain.         . traZODone (DESYREL) 50 MG tablet   Oral   Take 100 mg by mouth at bedtime as needed for sleep.           BP 140/100  Pulse 67  Temp(Src) 97.5 F (36.4 C) (Oral)  Resp 14  SpO2 99% Physical Exam  Nursing note and vitals reviewed. Constitutional: She is oriented to person, place, and time. She appears well-developed and well-nourished.  No distress.  HENT:  Head: Normocephalic and atraumatic.  Mouth/Throat: Oropharynx is clear and moist.  Eyes: Conjunctivae are normal. Pupils are equal, round, and reactive to light. No scleral icterus.  Neck: Neck supple.  Cardiovascular: Normal rate, regular rhythm, normal heart sounds and intact distal pulses.   No murmur heard. Pulmonary/Chest: Effort normal and breath sounds normal. No stridor. No respiratory distress. She has no rales.  Abdominal: Soft. Bowel sounds are normal. She exhibits no distension. There is tenderness in the right upper quadrant and epigastric area. There is no rigidity, no rebound and no guarding.  Musculoskeletal: Normal range of motion.  Neurological: She is alert and oriented to person, place, and time.  Skin: Skin is warm and dry. No rash noted.  Psychiatric: She has a normal mood and affect. Her behavior is normal.    ED Course   Procedures (including critical care time)  Labs Reviewed  CBC - Abnormal; Notable for the following:    WBC 12.7 (*)    Hemoglobin 15.6 (*)    MCHC 36.1 (*)    All other components within normal limits  PRO B NATRIURETIC PEPTIDE - Abnormal; Notable for the following:    Pro B Natriuretic peptide (BNP) 156.0 (*)    All other components within normal limits  BASIC METABOLIC PANEL  POCT I-STAT TROPONIN I   Dg Chest 2 View  04/29/2013   *RADIOLOGY REPORT*  Clinical Data: Chest and epigastric pain  CHEST - 2 VIEW  Comparison: 11/19/2012  Findings: The cardiac silhouette is normal in size.  The mediastinum is normal in contour caliber.  There are no hilar masses.  The lungs are clear.  No pleural effusion or pneumothorax.  Changes from a low anterior cervical spine fusion are stable.  The bony thorax is intact.  IMPRESSION: No acute cardiopulmonary disease.  Stable appearance from the prior exam.   Original Report Authenticated By: Amie Portland, M.D.  All radiology studies independently viewed by me.     1. Gastritis      MDM  52 yo female with a history of gastric ulcer disease presenting with epigastric burning abdominal pain. She ran out of her Protonix a few days ago. Initial vitals show hypertension and hypoxia. Unclear whether this is an accurate value, no respiratory symptoms, complaints, distress, or  hypoxia on my exam. Abdominal exam shows epigastric tenderness without rebound, rigidity, or guarding. Suspect rebound gastritis from running out of her PPI. Plan to check labs and treat symptomatically.  Pain radiates to chest, but story inconsistent with ACS, PE, or dissection.  Source of pain is epigastrium.    Labs unremarkable.  Remains well appearing.  Pain improved.  Re-prescribed her protonix, but advised close PCP follow up.    Candyce Churn, MD 04/30/13 [phone]

## 2013-04-29 NOTE — ED Notes (Signed)
PT. REPORTS MID CHEST / EPIGASTRIC PAIN ONSET THIS AFTERNOON WITH SOB , EMESIS AND OCCASIONAL COUGH .

## 2013-04-29 NOTE — ED Notes (Signed)
Pt is unable to give an urine specimen at this time. The pt has been advised to use call light for tech and assistance to restroom. The tech has reported to the RN in charge.

## 2013-04-30 ENCOUNTER — Encounter (HOSPITAL_COMMUNITY): Payer: Self-pay

## 2013-04-30 ENCOUNTER — Inpatient Hospital Stay (HOSPITAL_COMMUNITY)
Admission: AD | Admit: 2013-04-30 | Discharge: 2013-05-04 | DRG: 183 | Disposition: A | Discharge status: Home / Self Care | Payer: BC Managed Care – PPO | Source: Ambulatory Visit | Attending: Internal Medicine | Admitting: Internal Medicine

## 2013-04-30 ENCOUNTER — Ambulatory Visit: Payer: BC Managed Care – PPO | Admitting: Nurse Practitioner

## 2013-04-30 ENCOUNTER — Telehealth: Payer: Self-pay | Admitting: *Deleted

## 2013-04-30 DIAGNOSIS — Z981 Arthrodesis status: Secondary | ICD-10-CM

## 2013-04-30 DIAGNOSIS — R109 Unspecified abdominal pain: Secondary | ICD-10-CM

## 2013-04-30 DIAGNOSIS — I10 Essential (primary) hypertension: Secondary | ICD-10-CM

## 2013-04-30 DIAGNOSIS — R1013 Epigastric pain: Secondary | ICD-10-CM | POA: Diagnosis present

## 2013-04-30 DIAGNOSIS — K219 Gastro-esophageal reflux disease without esophagitis: Principal | ICD-10-CM | POA: Diagnosis present

## 2013-04-30 DIAGNOSIS — F339 Major depressive disorder, recurrent, unspecified: Secondary | ICD-10-CM | POA: Diagnosis present

## 2013-04-30 DIAGNOSIS — K279 Peptic ulcer, site unspecified, unspecified as acute or chronic, without hemorrhage or perforation: Secondary | ICD-10-CM

## 2013-04-30 DIAGNOSIS — F17200 Nicotine dependence, unspecified, uncomplicated: Secondary | ICD-10-CM | POA: Diagnosis present

## 2013-04-30 DIAGNOSIS — R110 Nausea: Secondary | ICD-10-CM

## 2013-04-30 DIAGNOSIS — F332 Major depressive disorder, recurrent severe without psychotic features: Secondary | ICD-10-CM

## 2013-04-30 DIAGNOSIS — F32A Depression, unspecified: Secondary | ICD-10-CM

## 2013-04-30 DIAGNOSIS — F341 Dysthymic disorder: Secondary | ICD-10-CM

## 2013-04-30 DIAGNOSIS — K3184 Gastroparesis: Secondary | ICD-10-CM

## 2013-04-30 DIAGNOSIS — Z9089 Acquired absence of other organs: Secondary | ICD-10-CM

## 2013-04-30 DIAGNOSIS — F329 Major depressive disorder, single episode, unspecified: Secondary | ICD-10-CM

## 2013-04-30 DIAGNOSIS — F410 Panic disorder [episodic paroxysmal anxiety] without agoraphobia: Secondary | ICD-10-CM | POA: Diagnosis present

## 2013-04-30 DIAGNOSIS — F411 Generalized anxiety disorder: Secondary | ICD-10-CM

## 2013-04-30 HISTORY — DX: Calculus of bile duct without cholangitis or cholecystitis without obstruction: K80.50

## 2013-04-30 LAB — COMPREHENSIVE METABOLIC PANEL
ALT: 15 U/L (ref 0–35)
AST: 17 U/L (ref 0–37)
Albumin: 3.7 g/dL (ref 3.5–5.2)
Alkaline Phosphatase: 107 U/L (ref 39–117)
GFR calc Af Amer: >90 mL/min (ref >90)
Glucose, Bld: 102 mg/dL — ABNORMAL HIGH (ref 70–99)
Potassium: 3.9 mEq/L (ref 3.5–5.1)
Sodium: 136 mEq/L (ref 135–145)
Total Protein: 6.7 g/dL (ref 6.0–8.3)

## 2013-04-30 LAB — CBC
HCT: 41.9 % (ref 36.0–46.0)
MCH: 29.5 pg (ref 26.0–34.0)
MCHC: 33.9 g/dL (ref 30.0–36.0)
MCV: 86.9 fL (ref 78.0–100.0)
RDW: 12.4 % (ref 11.5–15.5)

## 2013-04-30 LAB — HEPATIC FUNCTION PANEL
ALT: 11 U/L (ref 0–35)
Alkaline Phosphatase: 99 U/L (ref 39–117)
Bilirubin, Direct: <0.1 mg/dL (ref 0.0–0.3)
Total Bilirubin: 0.4 mg/dL (ref 0.3–1.2)

## 2013-04-30 MED ORDER — PANTOPRAZOLE SODIUM 40 MG IV SOLR
40.0000 mg | Freq: Two times a day (BID) | INTRAVENOUS | Status: DC
  Administered 2013-04-30 – 2013-05-01 (×3): 40 mg via INTRAVENOUS
  Filled 2013-04-30 (×4): qty 40

## 2013-04-30 MED ORDER — ENOXAPARIN SODIUM 40 MG/0.4ML ~~LOC~~ SOLN
40.0000 mg | SUBCUTANEOUS | Status: DC
  Administered 2013-04-30 – 2013-05-02 (×3): 40 mg via SUBCUTANEOUS
  Filled 2013-04-30 (×5): qty 0.4

## 2013-04-30 MED ORDER — PROMETHAZINE HCL 25 MG PO TABS
12.5000 mg | ORAL_TABLET | Freq: Four times a day (QID) | ORAL | Status: DC | PRN
  Administered 2013-04-30 – 2013-05-03 (×9): 12.5 mg via ORAL
  Filled 2013-04-30 (×8): qty 1

## 2013-04-30 MED ORDER — METOCLOPRAMIDE HCL 5 MG/ML IJ SOLN
10.0000 mg | Freq: Three times a day (TID) | INTRAMUSCULAR | Status: DC
  Administered 2013-04-30 – 2013-05-01 (×4): 10 mg via INTRAVENOUS
  Filled 2013-04-30 (×8): qty 2

## 2013-04-30 MED ORDER — ONDANSETRON HCL 4 MG PO TABS: 4.0000 mg | ORAL_TABLET | Freq: Four times a day (QID) | ORAL | Status: DC

## 2013-04-30 MED ORDER — SODIUM CHLORIDE 0.9 % IV SOLN
INTRAVENOUS | Status: DC
  Administered 2013-04-30 – 2013-05-01 (×3): via INTRAVENOUS

## 2013-04-30 MED ORDER — LORAZEPAM 0.5 MG PO TABS
0.5000 mg | ORAL_TABLET | Freq: Four times a day (QID) | ORAL | Status: DC | PRN
  Administered 2013-04-30 – 2013-05-03 (×6): 0.5 mg via ORAL
  Filled 2013-04-30 (×7): qty 1

## 2013-04-30 MED ORDER — PANTOPRAZOLE SODIUM 40 MG IV SOLR
40.0000 mg | Freq: Once | INTRAVENOUS | Status: AC
  Administered 2013-04-30: 40 mg via INTRAVENOUS
  Filled 2013-04-30: qty 40

## 2013-04-30 MED ORDER — ATENOLOL 25 MG PO TABS
25.0000 mg | ORAL_TABLET | Freq: Every day | ORAL | Status: DC
  Administered 2013-04-30 – 2013-05-03 (×4): 25 mg via ORAL
  Filled 2013-04-30 (×5): qty 1

## 2013-04-30 MED ORDER — DOCUSATE SODIUM 100 MG PO CAPS
100.0000 mg | ORAL_CAPSULE | Freq: Two times a day (BID) | ORAL | Status: DC
  Administered 2013-04-30 – 2013-05-01 (×2): 100 mg via ORAL
  Filled 2013-04-30 (×3): qty 1

## 2013-04-30 MED ORDER — PANTOPRAZOLE SODIUM 20 MG PO TBEC: 20.0000 mg | DELAYED_RELEASE_TABLET | Freq: Every day | ORAL | Status: DC

## 2013-04-30 MED ORDER — TEMAZEPAM 15 MG PO CAPS: 30.0000 mg | ORAL_CAPSULE | Freq: Every evening | ORAL | Status: DC | PRN

## 2013-04-30 MED ORDER — HYDROMORPHONE HCL PF 1 MG/ML IJ SOLN
1.0000 mg | INTRAMUSCULAR | Status: DC | PRN
  Administered 2013-04-30 – 2013-05-02 (×15): 1 mg via INTRAVENOUS
  Filled 2013-04-30 (×15): qty 1

## 2013-04-30 MED ORDER — HYDROMORPHONE HCL PF 1 MG/ML IJ SOLN: 1.0000 mg | Freq: Once | INTRAMUSCULAR | Status: DC

## 2013-04-30 MED ORDER — OXYCODONE-ACETAMINOPHEN 5-325 MG PO TABS: 1.0000 | ORAL_TABLET | Freq: Once | ORAL | Status: DC

## 2013-04-30 MED ORDER — TEMAZEPAM 15 MG PO CAPS
30.0000 mg | ORAL_CAPSULE | Freq: Every evening | ORAL | Status: DC | PRN
  Administered 2013-04-30 – 2013-05-03 (×4): 30 mg via ORAL
  Filled 2013-04-30 (×4): qty 2

## 2013-04-30 MED ORDER — LIDOCAINE HCL (PF) 1 % IJ SOLN
INTRAMUSCULAR | Status: AC
  Filled 2013-04-30: qty 5

## 2013-04-30 MED ORDER — PROMETHAZINE HCL 25 MG PO TABS
12.5000 mg | ORAL_TABLET | Freq: Four times a day (QID) | ORAL | Status: DC | PRN
  Filled 2013-04-30: qty 1

## 2013-04-30 NOTE — H&P (Signed)
Patient seen, examined, and I agree with the above documentation, including the assessment and plan. Likely exacerbation of gastroparesis and dyspepsia after being off of promotility agent and PPI.  Resuming his medications as above Depression, currently untreated.  No SI or HI.  I agree with reinitiation of SSRI therapy and she should follow with her PCP with this issue BP elevated, again out of home medicines, restarting, and for followup with PCP after discharge for this issue

## 2013-04-30 NOTE — H&P (Signed)
Primary Care Physician:  Pearson Grippe, MD Primary Gastroenterologist:    Erick Blinks, MD  CHIEF COMPLAINT:  Abdominal pain, nausea  HPI: Selena Farrell is a 52 y.o. 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 She also has a history of documented gastroparesis (study done off narcotics). Selena Farrell has chronic intermittent abdominal pain though she has done quite well over the last few months on PPI and Reglan. Patient called crying early this am.. She was having terrible abdominal pain and nausea. She was evaluated in ED last night and sent home around 3am. ED labs were unremarkable. Unfortunately Selena Farrell has been unable to afford most of her medications for at least a week. She hasn't taken her antidepressant in a month. She complains of severe upper abdominal pain / chest pain and nausea. Pain is constant, burning in nature. She has been unable to eat because of nausea and pain. She has had this same pain before, no new symptoms. Last BM 2 days ago.     Past Medical History  Diagnosis Date  . Hypertension   . Gastritis 05/2011    EGD-Dr. Rhea Belton   . Gastric ulcer 05/2011    EGD-Dr. Rhea Belton   . Depression   . Nicotine dependence   . Alcohol dependence     Quit Sept. 2012   . Helicobacter pylori (H. pylori) infection     Hx of   . External hemorrhoid   . Delayed gastric emptying     Past Surgical History  Procedure Laterality Date  . Appendectomy    . Cesarean section      x1  . Esophagogastroduodenoscopy N/A 11/28/2012    Procedure: ESOPHAGOGASTRODUODENOSCOPY (EGD);  Surgeon: Beverley Fiedler, MD;  Location: [facility];  Service: Gastroenterology;  Laterality: N/A;  . Cholecystectomy N/A 11/29/2012    Procedure: LAPAROSCOPIC CHOLECYSTECTOMY WITH INTRAOPERATIVE CHOLANGIOGRAM;  Surgeon: Velora Heckler, MD;  Location: [facility];  Service: General;  Laterality: N/A;  . Cervical  neck fusion      Prior to Admission medications   Medication Sig Start Date End Date Taking? Authorizing Provider  atenolol (TENORMIN) 25 MG tablet Take 25 mg by mouth at bedtime.    Yes Historical Provider, MD  levonorgestrel (MIRENA) 20 MCG/24HR IUD 1 each by Intrauterine route once.   Yes Historical Provider, MD  LORazepam (ATIVAN) 0.5 MG tablet Take 1 tablet (0.5 mg total) by mouth every 6 (six) hours as needed. For anxiety 11/30/12  Yes Alison Murray, MD  metoCLOPramide (REGLAN) 10 MG tablet Take 10 mg by mouth 4 (four) times daily -  before meals and at bedtime.   Yes Historical Provider, MD  ondansetron (ZOFRAN) 4 MG tablet Take 1 tablet (4 mg total) by mouth every 6 (six) hours. 04/30/13  Yes Candyce Churn, MD  temazepam (RESTORIL) 30 MG capsule Take 30 mg by mouth at bedtime as needed for sleep.   Yes Historical Provider, MD  traMADol (ULTRAM) 50 MG tablet Take 50 mg by mouth every 6 (six) hours as needed for pain.   Yes Historical Provider, MD    Current Facility-Administered Medications  Medication Dose Route Frequency Provider Last Rate Last Dose  . HYDROmorphone (DILAUDID) injection 1 mg  1 mg Intravenous Q3H PRN Meredith Pel, NP      . LORazepam (ATIVAN) tablet 0.5 mg  0.5 mg Oral Q6H PRN Meredith Pel, NP      .  metoCLOPramide (REGLAN) injection 10 mg  10 mg Intravenous TID AC & HS Meredith Pel, NP      . temazepam (RESTORIL) capsule 30 mg  30 mg Oral QHS PRN Meredith Pel, NP        Allergies as of 04/30/2013 - Review Complete 04/30/2013  Allergen Reaction Noted  . Aspirin Other (See Comments) 06/14/2011  . Vicodin [hydrocodone-acetaminophen] Nausea And Vomiting 06/14/2011    Family History  Problem Relation Age of Onset  . Colon cancer Neg Hx   . Alcohol abuse Mother   . Stroke Father   . Cancer Maternal Grandfather     prostate    History   Social History  . Marital Status: Widowed    Spouse Name: N/A    Number of Children: 1  . Years of  Education: N/A   Occupational History  .  [employer]   Social History Main Topics  . Smoking status: Current Every Day Smoker -- 0.50 packs/day for 25 years    Types: Cigarettes  . Smokeless tobacco: Never Used     Comment: Counseling to quit smoking given to patient in exam room   . Alcohol Use: No     Comment: Heavy drinker quit  Sept 2012   . Drug Use: No  . Sexual Activity: No   Review of Systems: All systems reviewed an negative except where noted in HPI.  Physical Exam: Vital signs in last 24 hours: Temp:  [97.5 F (36.4 C)-98.3 F (36.8 C)] 98.3 F (36.8 C) (08/19 0952) Pulse Rate:  [54-88] 78 (08/19 0952) Resp:  [10-24] 16 (08/19 0952) BP: (140-205)/(66-100) 153/83 mmHg (08/19 0952) SpO2:  [88 %-100 %] 96 % (08/19 0952) Weight:  [138 lb 3.7 oz (62.7 kg)] 138 lb 3.7 oz (62.7 kg) (08/19 0952)   General:  Pleasant, well-developed, white female crying intermittently Head:  Normocephalic and atraumatic. Eyes:  Sclera clear, no icterus.   Conjunctiva pink. Ears:  Normal auditory acuity. Mouth:  No deformity or lesions.  Neck:  Supple; no masses . Lungs:  Clear throughout to auscultation.   No wheezes, crackles, or rhonchi. No acute distress. Heart:  Regular rate and rhythm Abdomen:  Soft, nondistended, moderate epigastric tenderness.  No masses. No hepatomegaly. No obvious masses.  Normal bowel sounds.    Msk:  Symmetrical without gross deformities.. Extremities:  Without edema. Neurologic:  Alert and  oriented x4;  grossly normal neurologically. Skin:  Intact without significant lesions or rashes. Cervical Nodes:  No significant cervical adenopathy. Psych:  Alert , cooperative but emotional.  Lab Results:  Recent Labs  04/29/13 2052  WBC 12.7*  HGB 15.6*  HCT 43.2  PLT 369   BMET  Recent Labs  04/29/13 2052  NA 136  K 4.0  CL 99  CO2 26  GLUCOSE 96  BUN 6  CREATININE 0.71  CALCIUM 9.8   LFT  Recent Labs   04/29/13 2052  PROT 7.5  ALBUMIN 4.1  AST 13  ALT 11  ALKPHOS 99  BILITOT 0.4  BILIDIR <0.1  IBILI NOT CALCULATED   Studies/Results: Dg Chest 2 View  04/29/2013   *RADIOLOGY REPORT*  Clinical Data: Chest and epigastric pain  CHEST - 2 VIEW  Comparison: 11/19/2012  Findings: The cardiac silhouette is normal in size.  The mediastinum is normal in contour caliber.  There are no hilar masses.  The lungs are clear.  No pleural effusion or pneumothorax.  Changes from  a low anterior cervical spine fusion are stable.  The bony thorax is intact.  IMPRESSION: No acute cardiopulmonary disease.  Stable appearance from the prior exam.   Original Report Authenticated By: Amie Portland, M.D.   Endoscopic Studies: Colonoscopy 01/01/2013. Findings: ulcer at ileocecal valve, biopsies proved to be benign with no evidence of dysplasia or malignancy. Two rectosigmoid polyps which were hyperplastic. Large internal hemorrhoids were seen.   Impression / Plan:   38. 52 year old female with acute on chronic upper abdominal pain / nausea. 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 Unfortunately patient has been unable to afford her home meds for at least a week. Suspect she is having an acute flare of gastroparesis after being off QID Reglan. Burning in her chest is likely reflux related. Troponin in ED last night was 0.0. CXR negative. Will admit for observational stay. She will need IV Reglan, IV PPI, IV anti-emetics, and pain control. Will see which of her home meds are on [facility] 4 dollar list to try and less her cost burden.   2. Depression. She has been off antidepressant for a month now. She didn't feel medication was still of benefit and had difficulty affording it anyway. Again, will look what antidepressants are on 4 dollar list. Perhaps we can change antidepressants or increase home dose.  3. Tobacco abuse.   4. HTN, her BP is  elevated today. Continue home Atenolol.   LOS: 0 days   [name]  04/30/2013, 10:28 AM    \

## 2013-04-30 NOTE — ED Notes (Signed)
Pt by herself and driving; medication not given upon discharge; pt did not want to call for someone to drive her home.

## 2013-04-30 NOTE — Telephone Encounter (Signed)
Per [name], NP, order 24 hr OBS bed for pt at [facility] and notify pt. lmom at [phone] for someone to call me with a bed. Gregary Signs called back from Bed Placement and pt may go to admitting at [facility]; notified pt who stated understanding.

## 2013-05-01 ENCOUNTER — Encounter (HOSPITAL_COMMUNITY): Payer: Self-pay | Admitting: Physician Assistant

## 2013-05-01 MED ORDER — CITALOPRAM HYDROBROMIDE 20 MG PO TABS
20.0000 mg | ORAL_TABLET | Freq: Every day | ORAL | Status: DC
  Administered 2013-05-01 – 2013-05-04 (×4): 20 mg via ORAL
  Filled 2013-05-01 (×5): qty 1

## 2013-05-01 MED ORDER — BISACODYL 10 MG RE SUPP
10.0000 mg | Freq: Every day | RECTAL | Status: DC | PRN
  Administered 2013-05-01: 10 mg via RECTAL
  Filled 2013-05-01: qty 1

## 2013-05-01 MED ORDER — PANTOPRAZOLE SODIUM 40 MG PO TBEC
40.0000 mg | DELAYED_RELEASE_TABLET | Freq: Two times a day (BID) | ORAL | Status: DC
  Administered 2013-05-01 – 2013-05-04 (×6): 40 mg via ORAL
  Filled 2013-05-01 (×12): qty 1

## 2013-05-01 MED ORDER — HYDROCORTISONE 2.5 % RE CREA
TOPICAL_CREAM | RECTAL | Status: DC | PRN
  Administered 2013-05-01: 18:00:00 via TOPICAL
  Filled 2013-05-01: qty 28.35

## 2013-05-01 MED ORDER — ENSURE COMPLETE PO LIQD
237.0000 mL | Freq: Two times a day (BID) | ORAL | Status: DC
  Administered 2013-05-01 – 2013-05-04 (×3): 237 mL via ORAL

## 2013-05-01 MED ORDER — METOCLOPRAMIDE HCL 10 MG PO TABS
10.0000 mg | ORAL_TABLET | Freq: Three times a day (TID) | ORAL | Status: DC
  Administered 2013-05-01 – 2013-05-04 (×12): 10 mg via ORAL
  Filled 2013-05-01 (×19): qty 1

## 2013-05-01 MED ORDER — ONDANSETRON HCL 4 MG PO TABS
4.0000 mg | ORAL_TABLET | Freq: Four times a day (QID) | ORAL | Status: DC | PRN
  Administered 2013-05-02: 4 mg via ORAL
  Filled 2013-05-01: qty 1

## 2013-05-01 MED ORDER — DICYCLOMINE HCL 10 MG PO CAPS
10.0000 mg | ORAL_CAPSULE | Freq: Two times a day (BID) | ORAL | Status: DC
  Administered 2013-05-01 – 2013-05-04 (×6): 10 mg via ORAL
  Filled 2013-05-01 (×7): qty 1

## 2013-05-01 NOTE — Progress Notes (Signed)
Patient seen, examined, and I agree with the above documentation, including the assessment and plan. Improving with restarting home meds Needs PCP followup shortly after d/c to discuss BP and depression/anxiety management Change rx's to [facility] to make more affordable

## 2013-05-01 NOTE — Progress Notes (Signed)
Utilization review completed.  

## 2013-05-01 NOTE — Progress Notes (Signed)
INITIAL NUTRITION ASSESSMENT  DOCUMENTATION CODES Per approved criteria  -Not Applicable   INTERVENTION: - Ensure Complete BID - Encouraged pt to eat more at home, discussed importance of improving nutrition to overall health - Spiritual care consult r/t pt still processing grief from loss of husband - Will continue to monitor   NUTRITION DIAGNOSIS: Inadequate oral intake related to poor appetite, depression as evidenced by pt report.   Goal: 1. No further nausea/abdominal pain 2. Pt to consume >90% of meals/supplements   Monitor:  Weights, labs, intake, abdominal pain, nausea  Reason for Assessment: Nutrition risk   52 y.o. 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 She also has a history of documented gastroparesis (study done off narcotics). Pt with chronic intermittent abdominal pain.  Met with pt who reports having nausea for 1 week PTA with on/off vomiting with minimal PO intake during this time frame. Pt admits to even before then her appetite was poor r/t stress/grief from loss of husband who died this month 7 years ago. Pt unsure of weight changes; weight trend shows 7 pound weight loss in the past 5 months. Pt denies any nausea today, was working on lunch during RD visit, eating slow bites. Pt still with some abdominal pain. Pt interested in trying Ensure.    Height: Ht Readings from Last 1 Encounters:  04/30/13 5\' 4"  (1.626 m)    Weight: Wt Readings from Last 1 Encounters:  04/30/13 138 lb 3.7 oz (62.7 kg)    Ideal Body Weight: 120 lb   % Ideal Body Weight: 115%  Wt Readings from Last 10 Encounters:  04/30/13 138 lb 3.7 oz (62.7 kg)  01/24/13 141 lb 4.8 oz (64.093 kg)  01/24/13 141 lb 6 oz (64.127 kg)  01/01/13 141 lb (63.957 kg)  12/25/12 141 lb 2 oz (64.014 kg)   12/18/12 142 lb 12.5 oz (64.765 kg)  11/28/12 145 lb 3.2 oz (65.862 kg)  11/28/12 145 lb 3.2 oz (65.862 kg)  11/28/12 145 lb 3.2 oz (65.862 kg)  11/20/12 145 lb (65.772 kg)    Usual Body Weight: 145 lb in March 2014  % Usual Body Weight: 95%  BMI:  Body mass index is 23.72 kg/(m^2).  Estimated Nutritional Needs: Kcal: 1600-1900 Protein: 75-90g Fluid: 1.6-1.9L/day  Skin: Intact   Diet Order: Full Liquid  EDUCATION NEEDS: -No education needs identified at this time   Intake/Output Summary (Last 24 hours) at 05/01/13 1236 Last data filed at 05/01/13 1610  Gross per 24 hour  Intake 1951.66 ml  Output      0 ml  Net 1951.66 ml    Last BM: 8/17  Labs:   Recent Labs Lab 04/29/13 2052 04/30/13 1145  NA 136 136  K 4.0 3.9  CL 99 101  CO2 26 28  BUN 6 7  CREATININE 0.71 0.81  CALCIUM 9.8 9.3  GLUCOSE 96 102*    CBG (last 3)  No results found for this basename: GLUCAP,  in the last 72 hours  Scheduled Meds: . atenolol  25 mg Oral QHS  . citalopram  20 mg Oral Daily  . docusate sodium  100 mg Oral BID  . enoxaparin (LOVENOX) injection  40 mg Subcutaneous Q24H  .  HYDROmorphone (DILAUDID) injection  1 mg Intramuscular Once  . metoCLOPramide  10 mg Oral TID AC & HS  . pantoprazole  40 mg Oral BID AC    Continuous Infusions:   Past Medical History  Diagnosis Date  . Hypertension   . Gastritis 05/2011    EGD-Dr. Rhea Belton   . Gastric ulcer 05/2011    EGD-Dr. Rhea Belton   . Depression   . Nicotine dependence   . Alcohol dependence     Quit Sept. 2012   . Helicobacter pylori (H. pylori) infection     Hx of   . External hemorrhoid   . Delayed gastric emptying   . Choledocholithiasis 06/23/2011    ERCP, shincterotomy and baloon stone extraction by Dr Russella Dar.     Past Surgical History  Procedure Laterality Date  . Appendectomy    . Cesarean section      x1  . Esophagogastroduodenoscopy N/A 11/28/2012    Procedure: ESOPHAGOGASTRODUODENOSCOPY (EGD);   Surgeon: Beverley Fiedler, MD;  Location: [facility];  Service: Gastroenterology;  Laterality: N/A;  . Cholecystectomy N/A 11/29/2012    Procedure: LAPAROSCOPIC CHOLECYSTECTOMY WITH INTRAOPERATIVE CHOLANGIOGRAM;  Surgeon: Velora Heckler, MD;  Location: [facility];  Service: General;  Laterality: N/A;  . Cervical neck fusion  03/2004    C5-C6 anterior discectomy and fusion with allograft  . Ercp w/ sphicterotomy  06/23/2011    ERCP, shincterotomy and baloon stone extraction by Dr Russella Dar. 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     Levon Hedger MS, RD, LDN [phone] Pager [phone] After Hours Pager

## 2013-05-01 NOTE — Progress Notes (Signed)
[facility] Progress Note   Subjective  feels better, still some abdominal pain. Tolerating clears   Objective   Vital signs in last 24 hours: Temp:  [98.3 F (36.8 C)-98.7 F (37.1 C)] 98.6 F (37 C) (08/20 0600) Pulse Rate:  [55-78] 55 (08/20 0600) Resp:  [16-18] 18 (08/20 0600) BP: (117-154)/(71-90) 154/81 mmHg (08/20 0600) SpO2:  [96 %-98 %] 96 % (08/20 0600) Weight:  [138 lb 3.7 oz (62.7 kg)] 138 lb 3.7 oz (62.7 kg) (08/19 0952) Last BM Date: 04/28/13 General:    plesaant white female in NAD Heart:  Regular rate and rhythm; no murmurs Abdomen:  Soft, nontender and nondistended. Normal bowel sounds. Extremities:  Without edema. Neurologic:  Alert and oriented,  grossly normal neurologically. Psych:  Cooperative. Much calmer today.   Lab Results:  Recent Labs  04/29/13 2052 04/30/13 1145  WBC 12.7* 11.1*  HGB 15.6* 14.2  HCT 43.2 41.9  PLT 369 352   BMET  Recent Labs  04/29/13 2052 04/30/13 1145  NA 136 136  K 4.0 3.9  CL 99 101  CO2 26 28  GLUCOSE 96 102*  BUN 6 7  CREATININE 0.71 0.81  CALCIUM 9.8 9.3   LFT  Recent Labs  04/29/13 2052 04/30/13 1145  PROT 7.5 6.7  ALBUMIN 4.1 3.7  AST 13 17  ALT 11 15  ALKPHOS 99 107  BILITOT 0.4 0.5  BILIDIR <0.1  --   IBILI NOT CALCULATED  --      Assessment / Plan:   35. 52 year old female with acute on chronic upper abdominal pain / nausea likely secondary to inability to afford home meds.Improving on IV meds. Will advance diet to full liquid and transition her to oral meds.  Admission labs yesterday essentially unremarkable. VSS. Maybe home later today or in am.   2. HTN. BP not well controlled on beta blocker. SBP 140's - 150's / DBP 80-90. She will need follow up with PCP, Dr. Selena Batten.   3. Depression. Patient stopped Citalopram a month ago due to cost. Also, she felt it was ineffective. Citalopram is on [facility]'s 4 dollar list of medications (it is currently costing her 12 dollars). Will  restart Citalopram and give rx at discharge. She may need a dose increase, 40mg  is also on 4 dollar list but will defer further depression management to PCP.     LOS: 1 day   [name]  05/01/2013, 9:08 AM

## 2013-05-02 MED ORDER — HYDROCODONE-ACETAMINOPHEN 5-325 MG PO TABS
1.0000 | ORAL_TABLET | ORAL | Status: DC | PRN
  Administered 2013-05-02 – 2013-05-04 (×6): 1 via ORAL
  Filled 2013-05-02 (×7): qty 1

## 2013-05-02 NOTE — Progress Notes (Signed)
[facility] Progress Note   Subjective  some nausea this am but sipping on potato soup   Objective   Vital signs in last 24 hours: Temp:  [98.1 F (36.7 C)-99 F (37.2 C)] 98.2 F (36.8 C) (08/21 0600) Pulse Rate:  [54-66] 54 (08/21 0600) Resp:  [16-18] 18 (08/21 0600) BP: (129-165)/(70-83) 129/70 mmHg (08/21 0600) SpO2:  [96 %-98 %] 96 % (08/21 0600) Last BM Date: 05/01/13 General:    white female in NAD Heart:  Regular rate and rhythm Lungs: Respirations even and unlabored, lungs CTA bilaterally Abdomen:  Soft, nontender and nondistended. Normal bowel sounds. Extremities:  Without edema. Neurologic:  Alert and oriented,  grossly normal neurologically. Psych:  Cooperative. Normal mood and affect.  Lab Results:  Recent Labs  04/29/13 2052 04/30/13 1145  WBC 12.7* 11.1*  HGB 15.6* 14.2  HCT 43.2 41.9  PLT 369 352   BMET  Recent Labs  04/29/13 2052 04/30/13 1145  NA 136 136  K 4.0 3.9  CL 99 101  CO2 26 28  GLUCOSE 96 102*  BUN 6 7  CREATININE 0.71 0.81  CALCIUM 9.8 9.3   LFT  Recent Labs  04/29/13 2052 04/30/13 1145  PROT 7.5 6.7  ALBUMIN 4.1 3.7  AST 13 17  ALT 11 15  ALKPHOS 99 107  BILITOT 0.4 0.5  BILIDIR <0.1  --   IBILI NOT CALCULATED  --       Assessment / Plan:   1. Acute on chronic upper abdominal pain / nausea likely secondary to inability to afford home meds. She will change to [facility] pharmacy as many of her meds are on 4 dollar generic drug list.  She is improving with resumption of home meds. Reglan and PPI changed to PO yesterday. Overall abdominal pain and nausea improving. Will transition to PO pain meds this am.  If stable will look to discharge her after lunch today.    2. HTN. BP not well controlled on beta blocker. SBP 140's - 150's / DBP 80-90. She will need follow up with PCP, Dr. Selena Batten.   3. Depression. Patient stopped Citalopram a month ago due to cost. Also, she felt it was ineffective. Citalopram is on  [facility]'s 4 dollar list of medications (it is currently costing her 12 dollars). Will restart Citalopram and give rx at discharge. She may need a dose increase, 40mg  is also on 4 dollar list but will defer further depression management to     LOS: 2 days   [name]  05/02/2013, 10:16 AM

## 2013-05-02 NOTE — Progress Notes (Signed)
Patient seen, examined, and I agree with the above documentation, including the assessment and plan. D/c likely tomorrow

## 2013-05-03 ENCOUNTER — Other Ambulatory Visit: Payer: BC Managed Care – PPO

## 2013-05-03 DIAGNOSIS — F339 Major depressive disorder, recurrent, unspecified: Secondary | ICD-10-CM

## 2013-05-03 DIAGNOSIS — F410 Panic disorder [episodic paroxysmal anxiety] without agoraphobia: Secondary | ICD-10-CM

## 2013-05-03 DIAGNOSIS — F411 Generalized anxiety disorder: Secondary | ICD-10-CM

## 2013-05-03 DIAGNOSIS — F329 Major depressive disorder, single episode, unspecified: Secondary | ICD-10-CM

## 2013-05-03 MED ORDER — LORAZEPAM 1 MG PO TABS
1.0000 mg | ORAL_TABLET | Freq: Three times a day (TID) | ORAL | Status: DC
  Administered 2013-05-03 – 2013-05-04 (×2): 1 mg via ORAL
  Filled 2013-05-03 (×3): qty 1

## 2013-05-03 MED ORDER — LORAZEPAM 2 MG/ML IJ SOLN
0.5000 mg | Freq: Once | INTRAMUSCULAR | Status: AC
  Administered 2013-05-03: 0.5 mg via INTRAVENOUS
  Filled 2013-05-03: qty 1

## 2013-05-03 MED ORDER — HYDROXYZINE HCL 25 MG PO TABS
25.0000 mg | ORAL_TABLET | ORAL | Status: DC | PRN
  Administered 2013-05-03 – 2013-05-04 (×4): 25 mg via ORAL
  Filled 2013-05-03 (×3): qty 1

## 2013-05-03 NOTE — Progress Notes (Signed)
[facility] Progress Note   Subjective  feels better after anxiolytic   Objective   Vital signs in last 24 hours: Temp:  [98 F (36.7 C)-98.9 F (37.2 C)] 98.9 F (37.2 C) (08/22 0550) Pulse Rate:  [56-61] 58 (08/22 0550) Resp:  [16-20] 16 (08/22 0550) BP: (149-159)/(64-78) 149/64 mmHg (08/22 0550) SpO2:  [94 %-96 %] 96 % (08/22 0550) Last BM Date: 05/01/13 General:    white female in NAD Heart:  Regular rate and rhythm Lungs: Respirations even and unlabored, lungs CTA bilaterally Abdomen:  Soft, nontender and nondistended. Normal bowel sounds. Extremities:  Without edema. Neurologic:  Alert and oriented,  grossly normal neurologically. Psych:  Cooperative. Normal mood and affect.   Lab Results:  Recent Labs  04/30/13 1145  WBC 11.1*  HGB 14.2  HCT 41.9  PLT 352   BMET  Recent Labs  04/30/13 1145  NA 136  K 3.9  CL 101  CO2 28  GLUCOSE 102*  BUN 7  CREATININE 0.81  CALCIUM 9.3   LFT  Recent Labs  04/30/13 1145  PROT 6.7  ALBUMIN 3.7  AST 17  ALT 15  ALKPHOS 107  BILITOT 0.5     Assessment / Plan:   1. Acute on chronic upper abdominal pain / nausea likely secondary to inability to afford home meds. She will change to [facility] pharmacy as many of her meds are on 4 dollar generic drug list. Overall her GI symptoms have improved but are aggravated by ongoing depression / anxiety   2. HTN. BP not well controlled on beta blocker. SBP 140's - 150's / DBP 80-90. She will need follow up with PCP, Dr. Selena Batten.   3. Depression / anxiety. RN called this am reporting that patient was having severe panic attacks. IV Ativan was given and she is feeling better but we are reluctant to discharge patient home in her current mental / emotional state.  A psychiatric consult was placed for help with management of what seems to be severe depression / anxiety.      LOS: 3 days   [name]  05/03/2013, 11:37 AM
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
## 2013-05-03 NOTE — Progress Notes (Signed)
Patient seen, examined, and I agree with the above documentation, including the assessment and plan. Biggest issue at present is anxiety and depression, having panic attacks that have been debilitating Seen by psych today and social worker today and recs made, appreciate their help and input Likely for d/c tomorrow

## 2013-05-03 NOTE — Progress Notes (Signed)
Patient has had significant losses: spouse (7 years ago), two brothers (within a year), and a niece. In addition, she lost her puppy this May. She is feeling abandoned and struggling with existential angst. She has a daughter who does not believe in taking medicines. Patient recently went off her medicines to see if her "daughter was right." She was also concerned about cost of medicines. She found, however, that she did need the medicine. She said, "It helps me get through the day" and "helps me do things." She talked a lot about her anxiety and the anxiety attacks she has. They are a concern for her. Support; presence; listening; prayer. Referral to Spiritual Care Intern from [facility] who will be providing free counseling for her.

## 2013-05-03 NOTE — Consult Note (Signed)
Reason for Consult:Depreession and anxiety (panic) Referring Physician: Meredith Pel, NP   Selena Farrell is an 52 y.o. female.  HPI: Patient is seen and chart reviewed case discussed with Selena Pel, NP.  Patient stated that she has been depressed and having panic attacks since she stopped taking her medication about 2 months ago. Patient reported getting up middle of night with a nervous stomach, increased heart rate, chest tightness and feeling like choking. Patient stated she felt she can stay with her medication because she was feeling well until then. Patient also reported her husband passed away because of the cancer about 7 years ago but she did not get counseling and could not talk to anybody in her family or friends. Patient denies suicidal or homicidal ideations. Patient is willing to restart her medication and also asking for medication for anxiety.  MSE: Mental Status Examination: Patient appeared as per his stated age, in hospital gown, and poorly groomed, and she eye contact. Patient has anxious and depressed mood and his affect was consistent anxiety. He has normal rate, rhythm, and volume of speech. His thought process is linear and goal directed. Patient has denied suicidal, homicidal ideations, intentions or plans. Patient has no evidence of auditory or visual hallucinations, delusions, and paranoia. Patient has fair insight judgment and impulse control.  Past Medical History  Diagnosis Date  . Hypertension   . Gastritis 05/2011    EGD-Dr. Rhea Belton   . Gastric ulcer 05/2011    EGD-Dr. Rhea Belton   . Depression   . Nicotine dependence   . Alcohol dependence     Quit Sept. 2012   . Helicobacter pylori (H. pylori) infection     Hx of   . External hemorrhoid   . Delayed gastric emptying   . Choledocholithiasis 06/23/2011    ERCP, shincterotomy and baloon stone extraction by Dr Russella Dar.     Past Surgical History  Procedure Laterality Date  . Appendectomy    . Cesarean  section      x1  . Esophagogastroduodenoscopy N/A 11/28/2012    Procedure: ESOPHAGOGASTRODUODENOSCOPY (EGD);  Surgeon: Beverley Fiedler, MD;  Location: [facility];  Service: Gastroenterology;  Laterality: N/A;  . Cholecystectomy N/A 11/29/2012    Procedure: LAPAROSCOPIC CHOLECYSTECTOMY WITH INTRAOPERATIVE CHOLANGIOGRAM;  Surgeon: Velora Heckler, MD;  Location: [facility];  Service: General;  Laterality: N/A;  . Cervical neck fusion  03/2004    C5-C6 anterior discectomy and fusion with allograft  . Ercp w/ sphicterotomy  06/23/2011    ERCP, shincterotomy and baloon stone extraction by Dr Russella Dar. 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     Family History  Problem Relation Age of Onset  . Colon cancer Neg Hx   . Alcohol abuse Mother   . Stroke Father   . Cancer Maternal Grandfather     prostate    Social History:  reports that she has been smoking Cigarettes.  She has a 12.5 pack-year smoking history. She has never used smokeless tobacco. She reports that she does not drink alcohol or use illicit drugs.  Allergies:  Allergies  Allergen Reactions  . Aspirin Other (See Comments)    Pt has bleeding ulcer, not allowed to take aspirin  . Vicodin [Hydrocodone-Acetaminophen] Nausea And Vomiting    Medications: I have reviewed the patient's current medications.  No results found for this or any previous visit (from the past 48 hour(s)).  No results found.  Positive for anxiety, bad mood, depression, sleep disturbance and  Noncompliant with medication and reportedly cannot afford Blood pressure 149/64, pulse 58, temperature 98.9 F (37.2 C), temperature source Oral, resp. rate 16, height 5\' 4"  (1.626 m), weight 62.7 kg (138 lb 3.7 oz), SpO2 96.00%.   Assessment/Plan: MDD, recurrent Generalized anxiety disorder with panic episodes Grief, extended  Recommendation:  Continue Celexa 20 mg QD Increase/change Ativan 1 mg PO TID Start Vistaril 25 mg Q4 hours /PRN Refer to psych social  service regarding referring to psychiatric out patient care Patient does not meet criteria of in patient hospital as she has no safety concerns Appreciate psych consult and will follow as clinically required  [name]., MD  05/03/2013, 12:10 PM

## 2013-05-04 MED ORDER — HYDROXYZINE HCL 25 MG PO TABS: 25.0000 mg | ORAL_TABLET | ORAL | Status: DC | PRN

## 2013-05-04 MED ORDER — CITALOPRAM HYDROBROMIDE 20 MG PO TABS: 20.0000 mg | ORAL_TABLET | Freq: Every day | ORAL | Status: DC

## 2013-05-04 MED ORDER — LORAZEPAM 1 MG PO TABS: 1.0000 mg | ORAL_TABLET | Freq: Three times a day (TID) | ORAL | Status: DC

## 2013-05-04 MED ORDER — PANTOPRAZOLE SODIUM 40 MG PO TBEC: 40.0000 mg | DELAYED_RELEASE_TABLET | Freq: Two times a day (BID) | ORAL | Status: DC

## 2013-05-04 NOTE — Discharge Summary (Signed)
[facility] Discharge Summary  Name: Selena Farrell MRN: [id number] DOB: 1961-05-30 52 y.o. PCP:  Pearson Grippe, MD  Date of Admission: 04/30/2013  9:39 AM Date of Discharge: 05/04/2013 Primary Gastroenterologist: Erick Blinks, MD Discharging Physician: Erick Blinks, MD  Discharge Diagnosis: 1. Chest / epigastric pain and nausea. Improved. Symptoms likely secondary to GERD and gastroparesis flare after being off medication for several days.   2. Depression / anxiety. Psychiatry elevated inpatient and made medication changes / additions. Patient referred for outpatient psychiatric follow up.   3. HTN, suboptimally controlled on Atenolol. Will defer to PCP, Dr. Selena Batten  Consultations: Psychiatry  Procedures Performed:  Dg Chest 2 View  04/29/2013   *RADIOLOGY REPORT*  Clinical Data: Chest and epigastric pain  CHEST - 2 VIEW  Comparison: 11/19/2012  Findings: The cardiac silhouette is normal in size.  The mediastinum is normal in contour caliber.  There are no hilar masses.  The lungs are clear.  No pleural effusion or pneumothorax.  Changes from a low anterior cervical spine fusion are stable.  The bony thorax is intact.  IMPRESSION: No acute cardiopulmonary disease.  Stable appearance from the prior exam.   Original Report Authenticated By: Amie Portland, M.D.    GI Procedures: none  History/Physical Exam:  See Admission H&P  Admission HPI: Selena Farrell is a 52 y.o. 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 She also has a history of documented gastroparesis (study done off narcotics). [name] has chronic intermittent abdominal pain though she has done quite well over the last few months on PPI and Reglan. Patient called crying early this am.. She was having terrible abdominal pain and nausea. She was evaluated in ED last night and sent home around 3am. ED labs  were unremarkable. Unfortunately [name] has been unable to afford most of her medications for at least a week. She hasn't taken her antidepressant in a month. She complains of severe upper abdominal pain / chest pain and nausea. Pain is constant, burning in nature. She has been unable to eat because of nausea and pain. She has had this same pain before, no new symptoms. Last BM 2 days ago.   Hospital Course by problem list:  1. Chest / epigastric pain and nausea. Patient directly admitted for observational stay after she called crying with unbearable nausea and abdominal pain. Patient was not able to afford home medications and had not taken her PPI or Reglan for several days.  She was admitted to a medical bed for IV fluids and supportive care. Admission labs were basically remarkable. Two view of abdomen in ED the night before was unremarkable.  Once home meds were resumed she began feeling feel better. Unfortunately as patient was nearing discharge she began having severe panic attacks prompting evaluation by psychiatry. On the day of discharge patient's abdominal pain and nausea had improved. To help with financial burden of home medications we found that most of patient's home medications were on [facility] 4 dollar list.   2. Depression / anxiety. Patient stopped antidepressant a month ago as she couldn't afford it and felt the medicine wasn't helping anyway. This admission she was exhibiting significant anxiety and depression with frequent crying, chest pain, land fetal positioning in bed. Psychiatry evaluated but didn't feel patient met inpatient criteria. Her home Ativan was increased, Vistaril added and Celexa resumed. Patient was referred for outpatient psychiatry but in the meantime I have  given her prescriptions for vistaril, ativan and celexa as prescribed by psychiatrist.    3. HTN. BP suboptimally controlled on beta blocker. SBP 140's - 160's. Will defer to PCP, Dr. Selena Batten. I asked patient to call  for follow up with him  Discharge Vitals:  BP 131/73  Pulse 60  Temp(Src) 98.1 F (36.7 C) (Oral)  Resp 16  Ht 5\' 4"  (1.626 m)  Wt 138 lb 3.7 oz (62.7 kg)  BMI 23.72 kg/m2  SpO2 95%  Physical Exam General: pleasant female in NAD Cardiac: RRR Abdominal: Soft, nontender, nondistended. Normal bowel sounds Extremity: No edema Neuro: Alert and oriented.  Psych: calm, smiling.   Discharge Labs: No results found for this or any previous visit (from the past 24 hour(s)).  Disposition and follow-up:   Ms.Markee H Zalar was discharged from [facility] in stable condition.    Follow-up Appointments:  Future Appointments Provider Department Dept Phone   07/29/2013 2:30 PM Mauri [place] [facility] [phone]   07/29/2013 3:00 PM [facility] Covering Provider [address] [place] [facility] [phone]      Discharge Medications:   Medication List         atenolol 25 MG tablet  Commonly known as:  TENORMIN  Take 25 mg by mouth at bedtime.     citalopram 20 MG tablet  Commonly known as:  CELEXA  Take 1 tablet (20 mg total) by mouth daily.     hydrOXYzine 25 MG tablet  Commonly known as:  ATARAX/VISTARIL  Take 1 tablet (25 mg total) by mouth every 4 (four) hours as needed for anxiety.     levonorgestrel 20 MCG/24HR IUD  Commonly known as:  MIRENA  1 each by Intrauterine route once.     LORazepam 1 MG tablet  Commonly known as:  ATIVAN  Take 1 tablet (1 mg total) by mouth 3 (three) times daily before meals.     metoCLOPramide 10 MG tablet  Commonly known as:  REGLAN  Take 10 mg by mouth 4 (four) times daily -  before meals and at bedtime.     ondansetron 4 MG tablet  Commonly known as:  ZOFRAN  Take 1 tablet (4 mg total) by mouth every 6 (six) hours.     pantoprazole 40 MG tablet  Commonly known as:  PROTONIX  Take 1 tablet (40 mg total) by mouth 2 (two) times daily before a meal.     temazepam 30 MG  capsule  Commonly known as:  RESTORIL  Take 30 mg by mouth at bedtime as needed for sleep.     traMADol 50 MG tablet  Commonly known as:  ULTRAM  Take 50 mg by mouth every 6 (six) hours as needed for pain.        Signed: [name] 05/04/2013, 11:55 AM

## 2013-05-04 NOTE — Discharge Summary (Signed)
Patient seen, examined, and I agree with the above documentation, including the assessment and plan. Outpatient psychiatry followup as recommended PCP followup to discuss hypertension, suboptimally controlled at this point on atenolol

## 2013-05-06 ENCOUNTER — Telehealth: Payer: Self-pay | Admitting: Internal Medicine

## 2013-05-06 MED ORDER — METOCLOPRAMIDE HCL 10 MG PO TABS: 10.0000 mg | ORAL_TABLET | Freq: Three times a day (TID) | ORAL | Status: DC

## 2013-05-06 NOTE — Telephone Encounter (Signed)
Pt reports she needs her meds transferred to [facility]. She needs the Reglan sent there; done. She also needs an excuse for work from 04/30/13-05/10/13 and a RTW note for 05/14/13, attn: Elder Love at [phone]. Done.

## 2013-05-08 ENCOUNTER — Telehealth: Payer: Self-pay | Admitting: Dietician

## 2013-06-04 ENCOUNTER — Telehealth: Payer: Self-pay | Admitting: Nurse Practitioner

## 2013-06-06 ENCOUNTER — Other Ambulatory Visit: Payer: Self-pay | Admitting: Nurse Practitioner

## 2013-06-07 ENCOUNTER — Telehealth: Payer: Self-pay | Admitting: Internal Medicine

## 2013-06-10 ENCOUNTER — Other Ambulatory Visit: Payer: Self-pay | Admitting: Gastroenterology

## 2013-06-10 MED ORDER — HYDROXYZINE HCL 25 MG PO TABS: 25.0000 mg | ORAL_TABLET | ORAL | Status: DC | PRN

## 2013-06-10 NOTE — Telephone Encounter (Signed)
Refilled pt's hydrOXYzine

## 2013-06-13 NOTE — Telephone Encounter (Signed)
Ok to refill atarax

## 2013-06-13 NOTE — Telephone Encounter (Signed)
Patient called me Sunday afternoon for Ativan refill. I called in #20 so she could take one BID as needed. Told patient that PCP should manage anxiety meds in future.

## 2013-06-25 ENCOUNTER — Telehealth: Payer: Self-pay | Admitting: Internal Medicine

## 2013-06-25 NOTE — Telephone Encounter (Signed)
lmom for pt to call back

## 2013-07-01 NOTE — Telephone Encounter (Signed)
Pt never called back.

## 2013-07-06 ENCOUNTER — Emergency Department (INDEPENDENT_AMBULATORY_CARE_PROVIDER_SITE_OTHER)
Admission: EM | Admit: 2013-07-06 | Discharge: 2013-07-06 | Disposition: A | Discharge status: Home / Self Care | Payer: BC Managed Care – PPO | Source: Home / Self Care | Attending: Emergency Medicine | Admitting: Emergency Medicine

## 2013-07-06 ENCOUNTER — Encounter (HOSPITAL_COMMUNITY): Payer: Self-pay | Admitting: Emergency Medicine

## 2013-07-06 ENCOUNTER — Ambulatory Visit (HOSPITAL_COMMUNITY): Payer: BC Managed Care – PPO | Attending: Emergency Medicine

## 2013-07-06 DIAGNOSIS — S92919A Unspecified fracture of unspecified toe(s), initial encounter for closed fracture: Secondary | ICD-10-CM

## 2013-07-06 DIAGNOSIS — X58XXXA Exposure to other specified factors, initial encounter: Secondary | ICD-10-CM | POA: Insufficient documentation

## 2013-07-06 DIAGNOSIS — S92422A Displaced fracture of distal phalanx of left great toe, initial encounter for closed fracture: Secondary | ICD-10-CM

## 2013-07-06 NOTE — ED Notes (Signed)
Patient transported to X-ray (spoke w/Nita??)

## 2013-07-06 NOTE — ED Provider Notes (Signed)
Medical screening examination/treatment/procedure(s) were performed by non-physician practitioner and as supervising physician I was immediately available for consultation/collaboration.  Leslee Home, M.D.  Reuben Likes, MD 07/06/13 2020

## 2013-07-06 NOTE — ED Provider Notes (Signed)
CSN: [id number]     Arrival date & time 07/06/13  1033 History   First MD Initiated Contact with Patient 07/06/13 1151     Chief Complaint  Patient presents with  . Toe Injury   (Consider location/radiation/quality/duration/timing/severity/associated sxs/prior Treatment) HPI Comments: 52 year old female presents complaining of toe pain. She stubbed her toe at home 2 days ago. The area is very tender to touch, swollen, and bruised. Pain increases with any movements or with ambulation. Over-the-counter medications are not helping with pain. She wants to make sure it is not burping. Denies any numbness in the foot. Denies history of easy fractures.   Past Medical History  Diagnosis Date  . Hypertension   . Gastritis 05/2011    EGD-Dr. Rhea Belton   . Gastric ulcer 05/2011    EGD-Dr. Rhea Belton   . Depression   . Nicotine dependence   . Alcohol dependence     Quit Sept. 2012   . Helicobacter pylori (H. pylori) infection     Hx of   . External hemorrhoid   . Delayed gastric emptying   . Choledocholithiasis 06/23/2011    ERCP, shincterotomy and baloon stone extraction by Dr Russella Dar.    Past Surgical History  Procedure Laterality Date  . Appendectomy    . Cesarean section      x1  . Esophagogastroduodenoscopy N/A 11/28/2012    Procedure: ESOPHAGOGASTRODUODENOSCOPY (EGD);  Surgeon: Beverley Fiedler, MD;  Location: [facility];  Service: Gastroenterology;  Laterality: N/A;  . Cholecystectomy N/A 11/29/2012    Procedure: LAPAROSCOPIC CHOLECYSTECTOMY WITH INTRAOPERATIVE CHOLANGIOGRAM;  Surgeon: Velora Heckler, MD;  Location: [facility];  Service: General;  Laterality: N/A;  . Cervical neck fusion  03/2004    C5-C6 anterior discectomy and fusion with allograft  . Ercp w/ sphicterotomy  06/23/2011    ERCP, shincterotomy and baloon stone extraction by Dr Russella Dar. 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    Family History  Problem Relation Age of Onset  . Colon cancer Neg Hx   . Alcohol abuse  Mother   . Stroke Father   . Cancer Maternal Grandfather     prostate   History  Substance Use Topics  . Smoking status: Current Every Day Smoker -- 0.50 packs/day for 25 years    Types: Cigarettes  . Smokeless tobacco: Never Used     Comment: Counseling to quit smoking given to patient in exam room   . Alcohol Use: No     Comment: Heavy drinker quit  Sept 2012    OB History   Grav Para Term Preterm Abortions TAB SAB Ect Mult Living                 Review of Systems  Constitutional: Negative for fever and chills.  Eyes: Negative for visual disturbance.  Respiratory: Negative for cough and shortness of breath.   Cardiovascular: Negative for chest pain, palpitations and leg swelling.  Gastrointestinal: Negative for nausea, vomiting and abdominal pain.  Endocrine: Negative for polydipsia and polyuria.  Genitourinary: Negative for dysuria, urgency and frequency.  Musculoskeletal: Positive for arthralgias.       Toe pain  Skin: Negative for rash.  Neurological: Negative for dizziness, weakness and light-headedness.    Allergies  Aspirin and Vicodin  Home Medications   Current Outpatient Rx  Name  Route  Sig  Dispense  Refill  . atenolol (TENORMIN) 25 MG tablet   Oral   Take 25 mg by mouth at bedtime.          Marland Kitchen  citalopram (CELEXA) 20 MG tablet   Oral   Take 1 tablet (20 mg total) by mouth daily.   30 tablet   1   . LORazepam (ATIVAN) 1 MG tablet   Oral   Take 1 tablet (1 mg total) by mouth 3 (three) times daily before meals.   30 tablet   0   . traMADol (ULTRAM) 50 MG tablet   Oral   Take 50 mg by mouth every 6 (six) hours as needed for pain.         . hydrOXYzine (ATARAX/VISTARIL) 25 MG tablet   Oral   Take 1 tablet (25 mg total) by mouth every 4 (four) hours as needed for anxiety.   30 tablet   1   . levonorgestrel (MIRENA) 20 MCG/24HR IUD   Intrauterine   1 each by Intrauterine route once.         . metoCLOPramide (REGLAN) 10 MG tablet    Oral   Take 1 tablet (10 mg total) by mouth 4 (four) times daily -  before meals and at bedtime.   120 tablet   3   . ondansetron (ZOFRAN) 4 MG tablet   Oral   Take 1 tablet (4 mg total) by mouth every 6 (six) hours.   8 tablet   0   . pantoprazole (PROTONIX) 40 MG tablet   Oral   Take 1 tablet (40 mg total) by mouth 2 (two) times daily before a meal.   60 tablet   3   . temazepam (RESTORIL) 30 MG capsule   Oral   Take 30 mg by mouth at bedtime as needed for sleep.          BP 166/92  Pulse 66  Temp(Src) 100 F (37.8 C) (Oral)  Resp 16  SpO2 98% Physical Exam  Nursing note and vitals reviewed. Constitutional: She is oriented to person, place, and time. Vital signs are normal. She appears well-developed and well-nourished. No distress.  HENT:  Head: Normocephalic and atraumatic.  Pulmonary/Chest: Effort normal. No respiratory distress.  Musculoskeletal:       Left foot: She exhibits bony tenderness (tenderness of the great toe and second toe, and distal metatarsals. Ecchymosis of the lateral left great toe).  Neurological: She is alert and oriented to person, place, and time. She has normal strength. Coordination normal.  Skin: Skin is warm and dry. No rash noted. She is not diaphoretic.  Psychiatric: She has a normal mood and affect. Judgment normal.    ED Course  Procedures (including critical care time) Labs Review Labs Reviewed - No data to display Imaging Review Dg Foot Complete Left  07/06/2013   CLINICAL DATA:  Injury to left 1st toe.  EXAM: LEFT FOOT - COMPLETE 3+ VIEW  COMPARISON:  None.  FINDINGS: There is a mildly displaced fracture involving the lateral base of the 1st distal phalanx. No dislocation or subluxation is identified. Other bones of the foot are unremarkable. Overlying soft tissue swelling is identified. No soft tissue foreign body is seen.  IMPRESSION: Mildly displaced fracture involving the lateral base of the left 1st distal phalanx.    Electronically Signed   By: Irish Lack M.D.   On: 07/06/2013 12:48      MDM   1. Closed displaced fracture of distal phalanx of great toe, left, initial encounter    She has tramadol at home, take this for pain as needed. may combine with Tylenol. Buddy taping toes, put in a postop shoe,  given crutches. Followup with orthopedics early in the week.    Graylon Good, PA-C 07/06/13 1306

## 2013-07-06 NOTE — ED Notes (Addendum)
Pt c/o left greater toe inj onset yest am Reports she jammed her toe against the edge of a wooden desk Sxs include: swelling, tenderness, redness Ambulated to exam room w/a limp Pain increases w/pressure Last tetanus = unknown  She is alert w/no signs of acute distress.

## 2013-07-12 ENCOUNTER — Other Ambulatory Visit: Payer: Self-pay | Admitting: Internal Medicine

## 2013-07-18 ENCOUNTER — Other Ambulatory Visit: Payer: Self-pay

## 2013-07-19 ENCOUNTER — Telehealth (INDEPENDENT_AMBULATORY_CARE_PROVIDER_SITE_OTHER): Payer: Self-pay

## 2013-07-19 NOTE — Telephone Encounter (Signed)
Patient states he has been having bleeding hems several days. Appointment in urg ov 07-22-13 2:30. Advised patient to soak hems, eat soft food, green leafy vegs ,increase fluids,friuts . Advised patient if her condition got worse to go to the ER. Patient verbalized understanding

## 2013-07-22 ENCOUNTER — Encounter (INDEPENDENT_AMBULATORY_CARE_PROVIDER_SITE_OTHER): Payer: Self-pay | Admitting: General Surgery

## 2013-07-22 ENCOUNTER — Ambulatory Visit (INDEPENDENT_AMBULATORY_CARE_PROVIDER_SITE_OTHER): Payer: BC Managed Care – PPO | Admitting: General Surgery

## 2013-07-22 VITALS — BP 108/56 | HR 64 | Temp 98.0°F | Resp 16 | Ht 64.5 in | Wt 144.2 lb

## 2013-07-22 DIAGNOSIS — K648 Other hemorrhoids: Secondary | ICD-10-CM

## 2013-07-22 DIAGNOSIS — K644 Residual hemorrhoidal skin tags: Secondary | ICD-10-CM

## 2013-07-22 MED ORDER — HYDROCORTISONE ACE-PRAMOXINE 2.5-1 % RE CREA: 1.0000 "application " | TOPICAL_CREAM | Freq: Four times a day (QID) | RECTAL | Status: DC

## 2013-07-22 NOTE — Progress Notes (Signed)
Patient ID: Selena Farrell, female   DOB: 31-Dec-1960, 52 y.o.   MRN: [id number]  Chief Complaint  Patient presents with  . urgent    bleeding hems    HPI Selena Farrell is a 52 y.o. female.  She is basically self-referred for evaluation and management of hemorrhoids.  She has a long history of constipation. She's had hemorrhoid symptoms off-and-on for a few years. She's had a flareup in the past 4 days. She says her swollen on the outside and tender he has also been having some bleeding on a daily basis. Her last bowel movement was 3 days ago. She then tried to take some stool soft others. She says the external swelling is low but better since she's been taking some warm tub baths.  Comorbidities include tobacco abuse, hypertension, gastroparesis, history of papillary stenosis with ERCP and sphincterotomy by Dr. Rhea Belton. Laparoscopic cholecystectomy in March of this year by Dr. Gerrit Friends. Peptic ulcer disease, H. Pylori positive.  HPI  Past Medical History  Diagnosis Date  . Hypertension   . Gastritis 05/2011    EGD-Dr. Rhea Belton   . Gastric ulcer 05/2011    EGD-Dr. Rhea Belton   . Depression   . Nicotine dependence   . Alcohol dependence     Quit Sept. 2012   . Helicobacter pylori (H. pylori) infection     Hx of   . External hemorrhoid   . Delayed gastric emptying   . Choledocholithiasis 06/23/2011    ERCP, shincterotomy and baloon stone extraction by Dr Russella Dar.     Past Surgical History  Procedure Laterality Date  . Appendectomy    . Cesarean section      x1  . Esophagogastroduodenoscopy N/A 11/28/2012    Procedure: ESOPHAGOGASTRODUODENOSCOPY (EGD);  Surgeon: Beverley Fiedler, MD;  Location: [facility];  Service: Gastroenterology;  Laterality: N/A;  . Cholecystectomy N/A 11/29/2012    Procedure: LAPAROSCOPIC CHOLECYSTECTOMY WITH INTRAOPERATIVE CHOLANGIOGRAM;  Surgeon: Velora Heckler, MD;  Location: [facility];  Service: General;  Laterality: N/A;  . Cervical neck fusion  03/2004    C5-C6  anterior discectomy and fusion with allograft  . Ercp w/ sphicterotomy  06/23/2011    ERCP, shincterotomy and baloon stone extraction by Dr Russella Dar. 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     Family History  Problem Relation Age of Onset  . Colon cancer Neg Hx   . Alcohol abuse Mother   . Stroke Father   . Cancer Maternal Grandfather     prostate    Social History History  Substance Use Topics  . Smoking status: Current Every Day Smoker -- 0.50 packs/day for 25 years    Types: Cigarettes  . Smokeless tobacco: Never Used     Comment: Counseling to quit smoking given to patient in exam room   . Alcohol Use: No     Comment: Heavy drinker quit  Sept 2012     Allergies  Allergen Reactions  . Aspirin Other (See Comments)    Pt has bleeding ulcer, not allowed to take aspirin  . Vicodin [Hydrocodone-Acetaminophen] Nausea And Vomiting    Current Outpatient Prescriptions  Medication Sig Dispense Refill  . atenolol (TENORMIN) 25 MG tablet Take 25 mg by mouth at bedtime.       . citalopram (CELEXA) 20 MG tablet Take 1 tablet (20 mg total) by mouth daily.  30 tablet  1  . hydrOXYzine (ATARAX/VISTARIL) 25 MG tablet Take 1 tablet (25 mg total) by mouth every 4 (four)  hours as needed for anxiety.  30 tablet  1  . levonorgestrel (MIRENA) 20 MCG/24HR IUD 1 each by Intrauterine route once.      Marland Kitchen LORazepam (ATIVAN) 1 MG tablet Take 1 tablet (1 mg total) by mouth 3 (three) times daily before meals.  30 tablet  0  . metoCLOPramide (REGLAN) 10 MG tablet Take 1 tablet (10 mg total) by mouth 4 (four) times daily -  before meals and at bedtime.  120 tablet  3  . metoCLOPramide (REGLAN) 10 MG tablet TAKE 1 TABLET (10 MG TOTAL) BY MOUTH 4 (FOUR) TIMES DAILY - BEFORE MEALS AND AT BEDTIME.  60 tablet  0  . ondansetron (ZOFRAN) 4 MG tablet Take 1 tablet (4 mg total) by mouth every 6 (six) hours.  8 tablet  0  . pantoprazole (PROTONIX) 40 MG tablet Take 1 tablet (40 mg total) by mouth 2 (two)  times daily before a meal.  60 tablet  3  . temazepam (RESTORIL) 30 MG capsule Take 30 mg by mouth at bedtime as needed for sleep.      . traMADol (ULTRAM) 50 MG tablet Take 50 mg by mouth every 6 (six) hours as needed for pain.      . hydrocortisone-pramoxine (ANALPRAM-HC) 2.5-1 % rectal cream Place 1 application rectally 4 (four) times daily. For irritated and painful hemorrhoids  15 g  2   No current facility-administered medications for this visit.    Review of Systems Review of Systems  Constitutional: Negative for fever, chills and unexpected weight change.  HENT: Negative for congestion, hearing loss, sore throat, trouble swallowing and voice change.   Eyes: Negative for visual disturbance.  Respiratory: Negative for cough and wheezing.   Cardiovascular: Negative for chest pain, palpitations and leg swelling.  Gastrointestinal: Positive for constipation, anal bleeding and rectal pain. Negative for nausea, vomiting, abdominal pain, diarrhea, blood in stool and abdominal distention.  Genitourinary: Negative for hematuria, vaginal bleeding and difficulty urinating.  Musculoskeletal: Negative for arthralgias.  Skin: Negative for rash and wound.  Neurological: Negative for seizures, syncope and headaches.  Hematological: Negative for adenopathy. Does not bruise/bleed easily.  Psychiatric/Behavioral: Negative for confusion.    Blood pressure 108/56, pulse 64, temperature 98 F (36.7 C), temperature source Oral, resp. rate 16, height 5' 4.5" (1.638 m), weight 144 lb 3.2 oz (65.409 kg).  Physical Exam Physical Exam  Constitutional: She is oriented to person, place, and time. She appears well-developed and well-nourished. She appears distressed.  HENT:  Head: Normocephalic and atraumatic.  Eyes: Conjunctivae and EOM are normal. Pupils are equal, round, and reactive to light. Left eye exhibits no discharge. No scleral icterus.  Neck: Neck supple. No JVD present. No tracheal deviation  present. No thyromegaly present.  Scar right neck from the spine surgery  Cardiovascular: Normal rate, regular rhythm, normal heart sounds and intact distal pulses.   No murmur heard. Pulmonary/Chest: Effort normal and breath sounds normal. No respiratory distress. She has no wheezes. She has no rales. She exhibits no tenderness.  Odor of tobacco apparent.  Abdominal: Soft. Bowel sounds are normal. She exhibits no distension and no mass. There is no tenderness. There is no rebound and no guarding.  Laparoscopic scars from cholecystectomy. Right lower quadrant scar. Pfannenstiel incision. Soft and nontender.  Genitourinary:  Circumferential external hemorrhoids, slight edema. No infection. No thrombosis. Minimal tenderness. No dermatitis. Digital rectal exam reveals normal tone. No point tenderness. No blood. anoscopy revealed some internal hemorrhoids on the right and on  the left. These were injected with sclerosing solution. Tolerated well.  Musculoskeletal: She exhibits no edema and no tenderness.  Lymphadenopathy:    She has no cervical adenopathy.  Neurological: She is alert and oriented to person, place, and time. She exhibits normal muscle tone. Coordination normal.  Skin: Skin is warm. No rash noted. She is not diaphoretic. No erythema. No pallor.  Psychiatric: She has a normal mood and affect. Her behavior is normal. Judgment and thought content normal.    Data Reviewed Old records  Assessment    Internal hemorrhoids with bleeding  External hemorrhoids with edema  Chronic constipation History of peptic ulcer disease, H. Pylori positive History papillary sphincterotomy for pancreatic ampullary stenosis  Status post laparoscopic cholecystectomy March 2014 Gastroparesis Tobacco abuse Hypertension    Plan    Internal hemorrhoids injected with sclerosing solution today. Tolerated well  She was instructed in management of her constipation including hydration, diet, MiraLAX,  Metamucil and this was detailed in her naris and instructions. Prescription for Analpram-HC 2.5% cream called into her pharmacy electronically  Return to see [place] as needed.        Angelia Mould. Derrell Lolling, M.D., [facility] General and Minimally invasive Surgery Breast and Colorectal Surgery Office:   [phone] Pager:   [phone]  07/22/2013, 3:26 PM

## 2013-07-22 NOTE — Patient Instructions (Signed)
You have internal hemorrhoids which are probably the source of the bleeding. They were injected with a medicine today that should make them shrink and stop the bleeding within 2 or 3 days.  You also had a very large external hemorrhoids, but there is no evidence of blood clot or infection.  The next step in your treatment is to treat your constipation. Drinks 7-8 glasses of water a day Take Metamucil capsules, 4 capsules in the morning and 4 capsules in the evening Take MiraLAX, one dose twice a day until your bowels are moving fairly well, and then try to taper off of the MiraLAX but continue the Metamucil Low fat, high fiber diet  A prescription has been called into your pharmacy in [place] for a steroid cream to put on the outside that will help the external hemorrhoids get smaller and to make the burning and itching go away  Return to see Dr. Derrell Lolling or Dr. Gerrit Friends as needed.  Read the patient information booklet about hemorrhoids that we gave you.     Hemorrhoids Hemorrhoids are swollen veins around the rectum or anus. There are two types of hemorrhoids:   Internal hemorrhoids. These occur in the veins just inside the rectum. They may poke through to the outside and become irritated and painful.  External hemorrhoids. These occur in the veins outside the anus and can be felt as a painful swelling or hard lump near the anus. CAUSES  Pregnancy.   Obesity.   Constipation or diarrhea.   Straining to have a bowel movement.   Sitting for long periods on the toilet.  Heavy lifting or other activity that caused you to strain.  Anal intercourse. SYMPTOMS   Pain.   Anal itching or irritation.   Rectal bleeding.   Fecal leakage.   Anal swelling.   One or more lumps around the anus.  DIAGNOSIS  Your caregiver may be able to diagnose hemorrhoids by visual examination. Other examinations or tests that may be performed include:   Examination of the rectal area  with a gloved hand (digital rectal exam).   Examination of anal canal using a small tube (scope).   A blood test if you have lost a significant amount of blood.  A test to look inside the colon (sigmoidoscopy or colonoscopy). TREATMENT Most hemorrhoids can be treated at home. However, if symptoms do not seem to be getting better or if you have a lot of rectal bleeding, your caregiver may perform a procedure to help make the hemorrhoids get smaller or remove them completely. Possible treatments include:   Placing a rubber band at the base of the hemorrhoid to cut off the circulation (rubber band ligation).   Injecting a chemical to shrink the hemorrhoid (sclerotherapy).   Using a tool to burn the hemorrhoid (infrared light therapy).   Surgically removing the hemorrhoid (hemorrhoidectomy).   Stapling the hemorrhoid to block blood flow to the tissue (hemorrhoid stapling).  HOME CARE INSTRUCTIONS   Eat foods with fiber, such as whole grains, beans, nuts, fruits, and vegetables. Ask your doctor about taking products with added fiber in them (fibersupplements).  Increase fluid intake. Drink enough water and fluids to keep your urine clear or pale yellow.   Exercise regularly.   Go to the bathroom when you have the urge to have a bowel movement. Do not wait.   Avoid straining to have bowel movements.   Keep the anal area dry and clean. Use wet toilet paper or moist towelettes after  a bowel movement.   Medicated creams and suppositories may be used or applied as directed.   Only take over-the-counter or prescription medicines as directed by your caregiver.   Take warm sitz baths for 15 20 minutes, 3 4 times a day to ease pain and discomfort.   Place ice packs on the hemorrhoids if they are tender and swollen. Using ice packs between sitz baths may be helpful.   Put ice in a plastic bag.   Place a towel between your skin and the bag.   Leave the ice on for 15 20  minutes, 3 4 times a day.   Do not use a donut-shaped pillow or sit on the toilet for long periods. This increases blood pooling and pain.  SEEK MEDICAL CARE IF:  You have increasing pain and swelling that is not controlled by treatment or medicine.  You have uncontrolled bleeding.  You have difficulty or you are unable to have a bowel movement.  You have pain or inflammation outside the area of the hemorrhoids. MAKE SURE YOU:  Understand these instructions.  Will watch your condition.  Will get help right away if you are not doing well or get worse. Document Released: 08/26/2000 Document Revised: 08/15/2012 Document Reviewed: 07/03/2012 [facility] Patient Information 2014 [place], [place].

## 2013-07-28 ENCOUNTER — Other Ambulatory Visit: Payer: Self-pay | Admitting: Hematology and Oncology

## 2013-07-28 DIAGNOSIS — D72829 Elevated white blood cell count, unspecified: Secondary | ICD-10-CM

## 2013-07-29 ENCOUNTER — Telehealth (INDEPENDENT_AMBULATORY_CARE_PROVIDER_SITE_OTHER): Payer: Self-pay

## 2013-07-29 ENCOUNTER — Telehealth: Payer: Self-pay | Admitting: Hematology and Oncology

## 2013-07-29 ENCOUNTER — Encounter: Payer: Self-pay | Admitting: Hematology and Oncology

## 2013-07-29 ENCOUNTER — Other Ambulatory Visit (HOSPITAL_BASED_OUTPATIENT_CLINIC_OR_DEPARTMENT_OTHER): Payer: BC Managed Care – PPO | Admitting: Lab

## 2013-07-29 ENCOUNTER — Ambulatory Visit (HOSPITAL_BASED_OUTPATIENT_CLINIC_OR_DEPARTMENT_OTHER): Payer: BC Managed Care – PPO | Admitting: Hematology and Oncology

## 2013-07-29 VITALS — BP 109/68 | HR 63 | Temp 97.9°F | Resp 18 | Ht 64.5 in | Wt 143.0 lb

## 2013-07-29 DIAGNOSIS — R059 Cough, unspecified: Secondary | ICD-10-CM

## 2013-07-29 DIAGNOSIS — D72829 Elevated white blood cell count, unspecified: Secondary | ICD-10-CM

## 2013-07-29 DIAGNOSIS — F17200 Nicotine dependence, unspecified, uncomplicated: Secondary | ICD-10-CM

## 2013-07-29 DIAGNOSIS — R05 Cough: Secondary | ICD-10-CM

## 2013-07-29 HISTORY — DX: Cough, unspecified: R05.9

## 2013-07-29 LAB — CBC WITH DIFFERENTIAL/PLATELET
BASO%: 0.3 % (ref 0.0–2.0)
Eosinophils Absolute: 0.1 10*3/uL (ref 0.0–0.5)
MCHC: 32.3 g/dL (ref 31.5–36.0)
MONO#: 1.3 10*3/uL — ABNORMAL HIGH (ref 0.1–0.9)
MONO%: 9.1 % (ref 0.0–14.0)
NEUT#: 8.7 10*3/uL — ABNORMAL HIGH (ref 1.5–6.5)
RBC: 4.86 10*6/uL (ref 3.70–5.45)
RDW: 15.5 % — ABNORMAL HIGH (ref 11.2–14.5)
WBC: 14.3 10*3/uL — ABNORMAL HIGH (ref 3.9–10.3)

## 2013-07-29 LAB — SEDIMENTATION RATE: Sed Rate: 4 mm/hr (ref 0–22)

## 2013-07-29 NOTE — Telephone Encounter (Signed)
Gave pt appt for lab and Md for end of Decmber after seeing Dr. Shelle Iron, pulmonologist @ [facility]

## 2013-07-29 NOTE — Progress Notes (Signed)
[facility] OFFICE PROGRESS NOTE  Pearson Grippe, MD DIAGNOSIS:  Chronic leukocytosis  SUMMARY OF HEMATOLOGIC HISTORY: This is a pleasant 52 year old lady who was found to have chronic leukocytosis, predominant neutrophilia. In May of 2014, she had extensive workup which rule out CML. The cause of her leukocytosis was thought to be related to chronic smoking. INTERVAL HISTORY: Selena Farrell 52 y.o. female returns for further followup. She have recurrent chest infection this year and just completed 2 courses of antibiotics but not feeling completely well. She's had a productive cough with minimal sputum. Denies any hemoptysis. She denies any recent weight loss. She has intermittent chest discomfort from chronic coughing. She is attempting to quit smoking.  I have reviewed the past medical history, past surgical history, social history and family history with the patient and they are unchanged from previous note.  ALLERGIES:  is allergic to aspirin and vicodin.  MEDICATIONS:  Current Outpatient Prescriptions  Medication Sig Dispense Refill  . atenolol (TENORMIN) 25 MG tablet Take 25 mg by mouth at bedtime.       . citalopram (CELEXA) 20 MG tablet Take 1 tablet (20 mg total) by mouth daily.  30 tablet  1  . docusate sodium (COLACE) 100 MG capsule Take 100 mg by mouth daily as needed for mild constipation.      . hydrocortisone-pramoxine (ANALPRAM-HC) 2.5-1 % rectal cream Place 1 application rectally 4 (four) times daily. For irritated and painful hemorrhoids  15 g  2  . hydrOXYzine (ATARAX/VISTARIL) 25 MG tablet Take 1 tablet (25 mg total) by mouth every 4 (four) hours as needed for anxiety.  30 tablet  1  . levonorgestrel (MIRENA) 20 MCG/24HR IUD 1 each by Intrauterine route once.      Marland Kitchen LORazepam (ATIVAN) 1 MG tablet Take 1 tablet (1 mg total) by mouth 3 (three) times daily before meals.  30 tablet  0  . metoCLOPramide (REGLAN) 10 MG tablet Take 1 tablet (10 mg total) by mouth  4 (four) times daily -  before meals and at bedtime.  120 tablet  3  . metoCLOPramide (REGLAN) 10 MG tablet TAKE 1 TABLET (10 MG TOTAL) BY MOUTH 4 (FOUR) TIMES DAILY - BEFORE MEALS AND AT BEDTIME.  60 tablet  0  . ondansetron (ZOFRAN) 4 MG tablet Take 1 tablet (4 mg total) by mouth every 6 (six) hours.  8 tablet  0  . pantoprazole (PROTONIX) 40 MG tablet Take 1 tablet (40 mg total) by mouth 2 (two) times daily before a meal.  60 tablet  3  . temazepam (RESTORIL) 30 MG capsule Take 30 mg by mouth at bedtime as needed for sleep.      . traMADol (ULTRAM) 50 MG tablet Take 50 mg by mouth every 6 (six) hours as needed for pain.       No current facility-administered medications for this visit.     REVIEW OF SYSTEMS:   Constitutional: Denies fevers, chills or night sweats Eyes: Denies blurriness of vision Ears, nose, mouth, throat, and face: Denies mucositis or sore throat Cardiovascular: Denies palpitation, chest discomfort or lower extremity swelling Gastrointestinal:  Denies nausea, heartburn or change in bowel habits Skin: Denies abnormal skin rashes Lymphatics: Denies new lymphadenopathy or easy bruising Neurological:Denies numbness, tingling or new weaknesses Behavioral/Psych: Mood is stable, no new changes  All other systems were reviewed with the patient and are negative.  PHYSICAL EXAMINATION: ECOG PERFORMANCE STATUS: 1 - Symptomatic but completely ambulatory  Filed Vitals:  07/29/13 1446  BP: 109/68  Pulse: 63  Temp: 97.9 F (36.6 C)  Resp: 18   Filed Weights   07/29/13 1446  Weight: 143 lb (64.864 kg)    GENERAL:alert, no distress and comfortable. She looks thin but not cachectic SKIN: skin color, texture, turgor are normal, no rashes or significant lesions EYES: normal, Conjunctiva are pink and non-injected, sclera clear OROPHARYNX:no exudate, no erythema and lips, buccal mucosa, and tongue normal  NECK: supple, thyroid normal size, non-tender, without  nodularity LYMPH:  no palpable lymphadenopathy in the cervical, axillary or inguinal LUNGS: Notable bilateral wheezes, no crackles, normal percussion with normal breathing effort HEART: regular rate & rhythm and no murmurs and no lower extremity edema ABDOMEN:abdomen soft, non-tender and normal bowel sounds Musculoskeletal:no cyanosis of digits and no clubbing  NEURO: alert & oriented x 3 with fluent speech, no focal motor/sensory deficits  LABORATORY DATA:  I have reviewed the data as listed Results for orders placed in visit on 07/29/13 (from the past 48 hour(s))  CBC WITH DIFFERENTIAL     Status: Abnormal   Collection Time    07/29/13  2:31 PM      Result Value Range   WBC 14.3 (*) 3.9 - 10.3 10e3/uL   NEUT# 8.7 (*) 1.5 - 6.5 10e3/uL   HGB 13.8  11.6 - 15.9 g/dL   HCT 16.1  09.6 - 04.5 %   Platelets 279  145 - 400 10e3/uL   MCV 88.0  79.5 - 101.0 fL   MCH 28.4  25.1 - 34.0 pg   MCHC 32.3  31.5 - 36.0 g/dL   RBC 4.09  8.11 - 9.14 10e6/uL   RDW 15.5 (*) 11.2 - 14.5 %   lymph# 4.1 (*) 0.9 - 3.3 10e3/uL   MONO# 1.3 (*) 0.1 - 0.9 10e3/uL   Eosinophils Absolute 0.1  0.0 - 0.5 10e3/uL   Basophils Absolute 0.0  0.0 - 0.1 10e3/uL   NEUT% 61.3  38.4 - 76.8 %   LYMPH% 28.5  14.0 - 49.7 %   MONO% 9.1  0.0 - 14.0 %   EOS% 0.8  0.0 - 7.0 %   BASO% 0.3  0.0 - 2.0 %    Lab Results  Component Value Date   WBC 14.3* 07/29/2013   HGB 13.8 07/29/2013   HCT 42.7 07/29/2013   MCV 88.0 07/29/2013   PLT 279 07/29/2013   ASSESSMENT & PLAN:  #1 chronic leukocytosis This is likely due to chronic bronchitis. I recommend referral to pulmonologist for evaluation of this possible undiagnosed COPD. She agreed. #2 chronic cough Her most recent chest x-ray was reviewed and I did not see evidence of abnormalities. Her persistent cough could be due to undiagnosed COPD, however given her history of chronic smoking, however I'm concerned that we may be missing undiagnosed lung cancer. I recommend CT scan  of the chest for evaluation and she agreed. I'm concerned that she may have persistent bronchitis given abnormal chest examination. I recommend trying prednisone but the patient could not tolerate prednisone well in the past. She is comfortable waiting for pulmonology evaluation. #3 chronic smoking Spent some time educating the patient importance of nicotine cessation. The patient is attempting to quit on her own. All questions were answered. The patient knows to call the clinic with any problems, questions or concerns. No barriers to learning was detected.  I spent 25 minutes counseling the patient face to face. The total time spent in the appointment was 40 minutes and  more than 50% was on counseling.     [facility], Pape Parson, MD 07/29/2013 3:04 PM

## 2013-07-29 NOTE — Telephone Encounter (Signed)
Message copied by Ivory Broad on Mon Jul 29, 2013  2:11 PM ------      Message from: Louie Casa      Created: Mon Jul 29, 2013  9:45 AM      Regarding: Dr. Adron Bene Note 07/19/13      Contact: [phone]       Patient called and need a work note for 07/19/13 states she was off because she was sick and asked to fax at [phone] or if you have any questions can call her at above phone number.            Thank you. ------

## 2013-07-29 NOTE — Telephone Encounter (Signed)
I tried to call the pt but her phone is out of service at the moment.  I need to know what she was out of work for.   We didn't see her until 11/10.  I can then ask Dr Derrell Lolling if he can excuse it or not.

## 2013-07-31 NOTE — Telephone Encounter (Signed)
Tried patient's old # [phone] and still out of service.  I realized her chart was updated with a new number.  I called and there was no answer.  I left a message for her to call.  I need to know what she out sick for.  We didn't see her until 11/10 so I don't know if I can excuse that or not.

## 2013-07-31 NOTE — Telephone Encounter (Signed)
The pt called back.  She states she couldn't go to work 11/7 because of her hemorrhoidal flare up.  She had bleeding and constipation.  She called our office late that afternoon to get seen that day and couldn't.  We brought her in 11/10.  I told her I must see if Dr Derrell Lolling is willing to give her a note for 11/7.  I can call her back if he will not.

## 2013-08-02 ENCOUNTER — Encounter (INDEPENDENT_AMBULATORY_CARE_PROVIDER_SITE_OTHER): Payer: Self-pay

## 2013-08-02 ENCOUNTER — Ambulatory Visit (HOSPITAL_COMMUNITY)
Admission: RE | Admit: 2013-08-02 | Discharge: 2013-08-02 | Disposition: A | Discharge status: Home / Self Care | Payer: BC Managed Care – PPO | Source: Ambulatory Visit | Attending: Hematology and Oncology | Admitting: Hematology and Oncology

## 2013-08-02 DIAGNOSIS — R911 Solitary pulmonary nodule: Secondary | ICD-10-CM | POA: Insufficient documentation

## 2013-08-02 DIAGNOSIS — R059 Cough, unspecified: Secondary | ICD-10-CM | POA: Insufficient documentation

## 2013-08-02 DIAGNOSIS — D72829 Elevated white blood cell count, unspecified: Secondary | ICD-10-CM

## 2013-08-02 DIAGNOSIS — R079 Chest pain, unspecified: Secondary | ICD-10-CM | POA: Insufficient documentation

## 2013-08-02 DIAGNOSIS — Z9089 Acquired absence of other organs: Secondary | ICD-10-CM | POA: Insufficient documentation

## 2013-08-02 DIAGNOSIS — R05 Cough: Secondary | ICD-10-CM

## 2013-08-02 DIAGNOSIS — R0602 Shortness of breath: Secondary | ICD-10-CM | POA: Insufficient documentation

## 2013-08-07 ENCOUNTER — Other Ambulatory Visit: Payer: Self-pay | Admitting: Internal Medicine

## 2013-08-16 ENCOUNTER — Other Ambulatory Visit: Payer: Self-pay | Admitting: *Deleted

## 2013-08-16 MED ORDER — METOCLOPRAMIDE HCL 10 MG PO TABS: 10.0000 mg | ORAL_TABLET | Freq: Three times a day (TID) | ORAL | Status: DC

## 2013-08-16 NOTE — Progress Notes (Signed)
[name], NP requested we call in Reglan for pt; done.

## 2013-08-20 ENCOUNTER — Other Ambulatory Visit: Payer: Self-pay | Admitting: Lab

## 2013-08-20 ENCOUNTER — Ambulatory Visit: Payer: Self-pay | Admitting: Hematology and Oncology

## 2013-08-28 ENCOUNTER — Other Ambulatory Visit: Payer: Self-pay | Admitting: Internal Medicine

## 2013-08-28 DIAGNOSIS — R55 Syncope and collapse: Secondary | ICD-10-CM

## 2013-09-04 ENCOUNTER — Ambulatory Visit
Admission: RE | Admit: 2013-09-04 | Discharge: 2013-09-04 | Disposition: A | Discharge status: Home / Self Care | Payer: BC Managed Care – PPO | Source: Ambulatory Visit | Attending: Internal Medicine | Admitting: Internal Medicine

## 2013-09-04 DIAGNOSIS — R55 Syncope and collapse: Secondary | ICD-10-CM

## 2013-09-05 ENCOUNTER — Other Ambulatory Visit: Payer: Self-pay | Admitting: Internal Medicine

## 2013-09-06 ENCOUNTER — Institutional Professional Consult (permissible substitution): Payer: Self-pay | Admitting: Pulmonary Disease

## 2013-09-11 ENCOUNTER — Ambulatory Visit: Payer: Self-pay | Admitting: Hematology and Oncology

## 2013-09-11 ENCOUNTER — Other Ambulatory Visit: Payer: Self-pay

## 2013-09-17 ENCOUNTER — Encounter: Payer: Self-pay | Admitting: Neurology

## 2013-09-18 ENCOUNTER — Encounter: Payer: Self-pay | Admitting: Neurology

## 2013-09-18 ENCOUNTER — Ambulatory Visit (INDEPENDENT_AMBULATORY_CARE_PROVIDER_SITE_OTHER): Payer: BC Managed Care – PPO | Admitting: Neurology

## 2013-09-18 VITALS — BP 128/84 | HR 70 | Ht 63.5 in | Wt 142.0 lb

## 2013-09-18 DIAGNOSIS — R51 Headache: Secondary | ICD-10-CM

## 2013-09-18 DIAGNOSIS — M5030 Other cervical disc degeneration, unspecified cervical region: Secondary | ICD-10-CM

## 2013-09-18 MED ORDER — PREGABALIN 75 MG PO CAPS: 75.0000 mg | ORAL_CAPSULE | Freq: Two times a day (BID) | ORAL | Status: DC

## 2013-09-18 NOTE — Progress Notes (Signed)
[facility]    Provider:  Dr Janann Colonel Referring Provider: Jani Gravel, MD Primary Care Physician:  Jani Gravel, MD  CC:  Headache, neck pain, syncope  HPI:  Selena Farrell is a 53 y.o. female here as a referral from Dr. Maudie Mercury for evaluation of headache, syncope and family history of aneurysm.   Symptoms started around 3 weeks ago, began with pain and sensory loss on right side of face and down into the cervical region, recently spread to left side too. Developed severe headaches, predominantly occipital region. Described as mix of squeezing and pounding pain. At worst is a 10/10. Causing difficulty sleeping, when laying flat gets severe pain in head/neck and paresthesias radiating down RUE. Some nausea, no emesis. Gets some photo and phonophobia. Has history of migraine with aura, feels that these current headaches are different than her typical migraine. Currently taking tramadol for the pain, gives minimal symptomatic relief.   Notes some sensory changes, subjective weakness in bilateral arms and proximal lower extremities. Has some gait instability, no falls but feels off balance.   Has family history of aneurysms, had MRA done 5 years ago which was unremarkable.   Had cervical fusion around 8 years ago, unsure what level. Was told at that time that she had another disc that was also herniated, told she might need surgery in the future. At that time was having a lot of similar symptoms to above. She is unsure when the last time she has had a MRI of the cervical spine.   Reviewed recent brain MRI which was unremarkable.   Review of Systems: Out of a complete 14 system review, the patient complains of only the following symptoms, and all other reviewed systems are negative. Positive for memory loss headache numbness weakness slurred speech passed out depression anxiety none of sleep decreased energy change in appetite insomnia restless legs  History   Social History  .  Marital Status: Widowed    Spouse Name: N/A    Number of Children: 1  . Years of Education: GED   Occupational History  .  [employer]   Social History Main Topics  . Smoking status: Current Every Day Smoker -- 0.50 packs/day for 25 years    Types: Cigarettes  . Smokeless tobacco: Never Used     Comment: Counseling to quit smoking given to patient in exam room   . Alcohol Use: No     Comment: Heavy drinker quit  Sept 2012   . Drug Use: No  . Sexual Activity: No   Other Topics Concern  . Not on file   Social History Narrative   Patient is widowed.   Patient is living alone.   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   Patient drinks 3-4 cups of caffeine daily.   Patient has her GED.   Patient is right-handed.    Family History  Problem Relation Age of Onset  . Colon cancer Neg Hx   . Alcohol abuse Mother   . Stroke Father   . Cancer Maternal Grandfather     prostate  . Aneurysm Sister     Past Medical History  Diagnosis Date  . Hypertension   . Gastritis 05/2011    EGD-Dr. Hilarie Fredrickson   . Gastric ulcer 05/2011    EGD-Dr. Hilarie Fredrickson   . Depression   . Nicotine dependence   . Alcohol dependence     Quit Sept. 2012   .  Helicobacter pylori (H. pylori) infection     Hx of   . External hemorrhoid   . Delayed gastric emptying   . Choledocholithiasis 06/23/2011    ERCP, shincterotomy and baloon stone extraction by Dr Fuller Plan.   . Bronchitis   . Cough 07/29/2013  . Hyponatremia   . Tenosynovitis     right MC joint    Past Surgical History  Procedure Laterality Date  . Appendectomy    . Cesarean section      x1  . Esophagogastroduodenoscopy N/A 11/28/2012    Procedure: ESOPHAGOGASTRODUODENOSCOPY (EGD);  Surgeon: Jerene Bears, MD;  Location: [facility];  Service: Gastroenterology;  Laterality: N/A;  . Cholecystectomy N/A 11/29/2012    Procedure: LAPAROSCOPIC CHOLECYSTECTOMY WITH INTRAOPERATIVE CHOLANGIOGRAM;  Surgeon: Earnstine Regal, MD;  Location: [facility];  Service: General;  Laterality: N/A;  . Cervical neck fusion  03/2004    C5-C6 anterior discectomy and fusion with allograft  . Ercp w/ sphicterotomy  06/23/2011    ERCP, shincterotomy and baloon stone extraction by Dr Fuller Plan. 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     Current Outpatient Prescriptions  Medication Sig Dispense Refill  . atenolol (TENORMIN) 25 MG tablet TAKE 1 TABLET (25 MG TOTAL) BY MOUTH DAILY.  30 tablet  5  . citalopram (CELEXA) 20 MG tablet Take 1 tablet (20 mg total) by mouth daily.  30 tablet  1  . docusate sodium (COLACE) 100 MG capsule Take 100 mg by mouth daily as needed for mild constipation.      . hydrocortisone-pramoxine (ANALPRAM-HC) 2.5-1 % rectal cream Place 1 application rectally 4 (four) times daily. For irritated and painful hemorrhoids  15 g  2  . hydrOXYzine (ATARAX/VISTARIL) 25 MG tablet Take 1 tablet (25 mg total) by mouth every 4 (four) hours as needed for anxiety.  30 tablet  1  . levonorgestrel (MIRENA) 20 MCG/24HR IUD 1 each by Intrauterine route once.      Marland Kitchen LORazepam (ATIVAN) 1 MG tablet Take 1 tablet (1 mg total) by mouth 3 (three) times daily before meals.  30 tablet  0  . metoCLOPramide (REGLAN) 10 MG tablet TAKE 1 TABLET (10 MG TOTAL) BY MOUTH 4 (FOUR) TIMES DAILY - BEFORE MEALS AND AT BEDTIME.  60 tablet  0  . metoCLOPramide (REGLAN) 10 MG tablet Take 1 tablet (10 mg total) by mouth 4 (four) times daily -  before meals and at bedtime.  120 tablet  3  . ondansetron (ZOFRAN) 4 MG tablet Take 1 tablet (4 mg total) by mouth every 6 (six) hours.  8 tablet  0  . pantoprazole (PROTONIX) 40 MG tablet Take 1 tablet (40 mg total) by mouth 2 (two) times daily before a meal.  60 tablet  3  . temazepam (RESTORIL) 30 MG capsule Take 30 mg by mouth at bedtime as needed for sleep.      . traMADol (ULTRAM) 50 MG tablet Take 50 mg by mouth every 6 (six) hours as needed for pain.       No current facility-administered  medications for this visit.    Allergies as of 09/18/2013 - Review Complete 09/18/2013  Allergen Reaction Noted  . Aspirin Other (See Comments) 06/14/2011  . Celebrex [celecoxib]  09/17/2013  . Nsaids  09/17/2013  . Prednisone  09/17/2013  . Vicodin [hydrocodone-acetaminophen] Nausea And Vomiting 06/14/2011    Vitals: BP 128/84  Pulse 70  Ht 5' 3.5" (1.613 m)  Wt 142 lb (64.411 kg)  BMI 24.76  kg/m2 Last Weight:  Wt Readings from Last 1 Encounters:  09/18/13 142 lb (64.411 kg)   Last Height:   Ht Readings from Last 1 Encounters:  09/18/13 5' 3.5" (1.613 m)     Physical exam: Exam: Gen: NAD, conversant Eyes: anicteric sclerae, moist conjunctivae HENT: Atraumatic, oropharynx clear Neck: Trachea midline; supple,  Lungs: CTA, no wheezing, rales, rhonic                          CV: RRR, no MRG Abdomen: Soft, non-tender;  Extremities: No peripheral edema  Skin: Normal temperature, no rash,  Psych: Appropriate affect, pleasant  Neuro: MS: AA&Ox3, appropriately interactive, normal affect   Speech: fluent w/o paraphasic error  Memory: good recent and remote recall  CN: PERRL, EOMI no nystagmus, no ptosis, sensation intact to LT V1-V3 bilat, face symmetric, no weakness, hearing grossly intact, palate elevates symmetrically, shoulder shrug 5/5 bilat,  tongue protrudes midline, no fasiculations noted.  Motor: normal bulk and tone Strength: 5/5  In all extremities  Coord: rapid alternating and point-to-point (FNF, HTS) movements intact.  Reflexes: symmetrical, bilat downgoing toes  Sens: diminished LT in cervical region on right side, otherwise intact sensation  Gait: posture, stance, stride and arm-swing normal. Tandem gait intact. Able to walk on heels and toes. Romberg absent.   Assessment:  After physical and neurologic examination, review of laboratory studies, imaging, neurophysiology testing and pre-existing records, assessment will be reviewed on the problem  list.  Plan:  Treatment plan and additional workup will be reviewed under Problem List.  1)Cervical degenerative disc disease 2)Headache  53y/o woman presenting for initial evaluation of severe headache and cervical neck pain, refractory to tramadol. Patient has a history of cervical degenerative disc disease with a fusion around 8 years ago. At that time was told she had other changes that may require surgery in the future. Of note she also has a family history of brain aneurysm, has had MRA completed 5 years ago she was unremarkable. Based on clinical history her presentation is most consistent with a exacerbation of her cervical degenerative disc disease. Would also consider migraine exacerbation. Low suspicion that this represents a manifestation of a cerebral aneurysm. Will check cervical spine MRI. Patient started on Lyrica 75 mg twice a day. No indication for a brain MRA at this time. Pending results of MRI of cervical spine would consider referral to physical therapy and or neurosurgery. Will followup once MRI completed. Patient to remain out of work pending above workup.   Jim Like, DO  [facility] [address] [place], [place] [address]  Phone [phone] Fax [phone]

## 2013-09-18 NOTE — Patient Instructions (Signed)
Overall you are doing fairly well but I do want to suggest a few things today:   Remember to drink plenty of fluid, eat healthy meals and do not skip any meals. Try to eat protein with a every meal and eat a healthy snack such as fruit or nuts in between meals. Try to keep a regular sleep-wake schedule and try to exercise daily, particularly in the form of walking, 20-30 minutes a day, if you can.   As far as your medications are concerned, I would like to suggest the following: 1)Start Lyrica 75mg  twice a day  As far as diagnostic testing:  1)MRI of the cervical spine  We will contact you once the MRI is completed. Please call us with any interim questions, concerns, problems, updates or refill requests.   Please also call us for any test results so we can go over those with you on the phone.  My clinical assistant and will answer any of your questions and relay your messages to me and also relay most of my messages to you.   Our phone number is [phone]. We also have an after hours call service for urgent matters and there is a physician on-call for urgent questions. For any emergencies you know to call 911 or go to the nearest emergency room

## 2013-09-20 ENCOUNTER — Encounter: Payer: Self-pay | Admitting: Neurology

## 2013-09-20 ENCOUNTER — Telehealth: Payer: Self-pay | Admitting: Neurology

## 2013-09-20 NOTE — Telephone Encounter (Signed)
Patient called stating that she needs an excuse note for work because her neck is hurting and her MRI has not been scheduled yet. Please call the patient.

## 2013-09-20 NOTE — Telephone Encounter (Signed)
Called patient to discuss with her about the work note that she said the Dr. Janann Colonel would write for her. Patient stated that her other doctor wrote a note having her out until 09/23/13, but her MRI is schedule for 09/25/13. So the patient would like for Dr. Janann Colonel to write her a work note until she has her MRI done. Please advise.

## 2013-09-20 NOTE — Telephone Encounter (Signed)
Letter completed. Sent to be mailed. Thanks.

## 2013-09-25 ENCOUNTER — Ambulatory Visit (INDEPENDENT_AMBULATORY_CARE_PROVIDER_SITE_OTHER): Payer: BC Managed Care – PPO

## 2013-09-25 ENCOUNTER — Telehealth: Payer: Self-pay | Admitting: Neurology

## 2013-09-25 DIAGNOSIS — M5030 Other cervical disc degeneration, unspecified cervical region: Secondary | ICD-10-CM

## 2013-09-25 NOTE — Telephone Encounter (Signed)
Called patient to let her know that MRI results(done 09/25/13) have not been reviewed as yet ,will call back once read by physician, she verbalized understanding.

## 2013-09-25 NOTE — Telephone Encounter (Signed)
NEEDS MRI RESULTS

## 2013-09-26 ENCOUNTER — Telehealth: Payer: Self-pay | Admitting: *Deleted

## 2013-09-26 NOTE — Telephone Encounter (Signed)
Called patient to inform her that her MRI results has not came back yet and when they do that someone will call to inform her of the results. I advised the patient that if she has any other problems, questions or concerns to call the office. Patient verbalized understanding.

## 2013-09-27 ENCOUNTER — Telehealth: Payer: Self-pay | Admitting: Neurology

## 2013-09-27 NOTE — Telephone Encounter (Signed)
Called patient back to discuss MRI results. Counseled her that I would try a course of physical therapy and continue on the Lyrica. If she did not get any relief then we could consider a referral to neurosurgery for possible injections or surgical intervention. She did not wish to try PT initially and prefers to first be evaluated by neurosurgery. She has been evaluated by Dr Annette Stable with [facility] in the past and will plan to follow up with him.

## 2013-10-08 ENCOUNTER — Other Ambulatory Visit: Payer: Self-pay | Admitting: Neurosurgery

## 2013-10-08 DIAGNOSIS — M47812 Spondylosis without myelopathy or radiculopathy, cervical region: Secondary | ICD-10-CM

## 2013-10-10 ENCOUNTER — Institutional Professional Consult (permissible substitution): Payer: BC Managed Care – PPO | Admitting: Pulmonary Disease

## 2013-10-11 ENCOUNTER — Ambulatory Visit
Admission: RE | Admit: 2013-10-11 | Discharge: 2013-10-11 | Disposition: A | Discharge status: Home / Self Care | Payer: Worker's Compensation | Source: Ambulatory Visit | Attending: Neurosurgery | Admitting: Neurosurgery

## 2013-10-11 ENCOUNTER — Ambulatory Visit
Admission: RE | Admit: 2013-10-11 | Discharge: 2013-10-11 | Disposition: A | Discharge status: Home / Self Care | Payer: BC Managed Care – PPO | Source: Ambulatory Visit | Attending: Neurosurgery | Admitting: Neurosurgery

## 2013-10-11 ENCOUNTER — Inpatient Hospital Stay
Admission: RE | Admit: 2013-10-11 | Discharge: 2013-10-11 | Disposition: A | Discharge status: Home / Self Care | Payer: Self-pay | Source: Ambulatory Visit | Attending: *Deleted | Admitting: *Deleted

## 2013-10-11 ENCOUNTER — Other Ambulatory Visit: Payer: Self-pay | Admitting: *Deleted

## 2013-10-11 VITALS — BP 117/75 | HR 69

## 2013-10-11 DIAGNOSIS — M47812 Spondylosis without myelopathy or radiculopathy, cervical region: Secondary | ICD-10-CM

## 2013-10-11 MED ORDER — MEPERIDINE HCL 100 MG/ML IJ SOLN
75.0000 mg | Freq: Once | INTRAMUSCULAR | Status: AC
  Administered 2013-10-11: 75 mg via INTRAMUSCULAR

## 2013-10-11 MED ORDER — IOHEXOL 300 MG/ML  SOLN
10.0000 mL | Freq: Once | INTRAMUSCULAR | Status: AC | PRN
  Administered 2013-10-11: 10 mL via INTRATHECAL

## 2013-10-11 MED ORDER — DIPHENHYDRAMINE HCL 50 MG PO CAPS
50.0000 mg | ORAL_CAPSULE | Freq: Once | ORAL | Status: AC
  Administered 2013-10-11: 50 mg via ORAL

## 2013-10-11 MED ORDER — DIAZEPAM 5 MG PO TABS
10.0000 mg | ORAL_TABLET | Freq: Once | ORAL | Status: AC
  Administered 2013-10-11: 10 mg via ORAL

## 2013-10-11 MED ORDER — ONDANSETRON HCL 4 MG/2ML IJ SOLN: 4.0000 mg | Freq: Four times a day (QID) | INTRAMUSCULAR | Status: DC | PRN

## 2013-10-11 MED ORDER — ONDANSETRON HCL 4 MG/2ML IJ SOLN
4.0000 mg | Freq: Once | INTRAMUSCULAR | Status: AC
  Administered 2013-10-11: 4 mg via INTRAMUSCULAR

## 2013-10-11 NOTE — Discharge Instructions (Signed)
Myelogram Discharge Instructions  1. Go home and rest quietly for the next 24 hours.  It is important to lie flat for the next 24 hours.  Get up only to go to the restroom.  You may lie in the bed or on a couch on your back, your stomach, your left side or your right side.  You may have one pillow under your head.  You may have pillows between your knees while you are on your side or under your knees while you are on your back.  2. DO NOT drive today.  Recline the seat as far back as it will go, while still wearing your seat belt, on the way home.  3. You may get up to go to the bathroom as needed.  You may sit up for 10 minutes to eat.  You may resume your normal diet and medications unless otherwise indicated.  Drink lots of extra fluids today and tomorrow.  4. The incidence of headache, nausea, or vomiting is about 5% (one in 20 patients).  If you develop a headache, lie flat and drink plenty of fluids until the headache goes away.  Caffeinated beverages may be helpful.  If you develop severe nausea and vomiting or a headache that does not go away with flat bed rest, call [phone].  5. You may resume normal activities after your 24 hours of bed rest is over; however, do not exert yourself strongly or do any heavy lifting tomorrow. If when you get up you have a headache when standing, go back to bed and force fluids for another 24 hours.  6. Call your physician for a follow-up appointment.  The results of your myelogram will be sent directly to your physician by the following day.  7. If you have any questions or if complications develop after you arrive home, please call [phone].  Discharge instructions have been explained to the patient.  The patient, or the person responsible for the patient, fully understands these instructions.      May resume citalopram, phenergan and tramadol on Jan. 31, 2015, after 8:30 am.

## 2013-10-11 NOTE — Progress Notes (Signed)
Patient states she has been off Citalopram, Tramadol and Phenergan for the past two days.

## 2013-10-11 NOTE — Progress Notes (Signed)
Patient's daughter notified of 1000 discharge time.  jkl

## 2013-10-31 ENCOUNTER — Institutional Professional Consult (permissible substitution): Payer: Self-pay | Admitting: Pulmonary Disease

## 2013-11-04 ENCOUNTER — Encounter: Payer: Self-pay | Admitting: Pulmonary Disease

## 2013-12-05 ENCOUNTER — Emergency Department (HOSPITAL_COMMUNITY)
Admission: EM | Admit: 2013-12-05 | Discharge: 2013-12-05 | Disposition: A | Discharge status: Home / Self Care | Payer: BC Managed Care – PPO | Attending: Emergency Medicine | Admitting: Emergency Medicine

## 2013-12-05 ENCOUNTER — Encounter (HOSPITAL_COMMUNITY): Payer: Self-pay | Admitting: Emergency Medicine

## 2013-12-05 DIAGNOSIS — IMO0001 Reserved for inherently not codable concepts without codable children: Secondary | ICD-10-CM

## 2013-12-05 DIAGNOSIS — R197 Diarrhea, unspecified: Secondary | ICD-10-CM | POA: Insufficient documentation

## 2013-12-05 DIAGNOSIS — M545 Low back pain, unspecified: Secondary | ICD-10-CM | POA: Insufficient documentation

## 2013-12-05 DIAGNOSIS — D72829 Elevated white blood cell count, unspecified: Secondary | ICD-10-CM | POA: Insufficient documentation

## 2013-12-05 DIAGNOSIS — Z862 Personal history of diseases of the blood and blood-forming organs and certain disorders involving the immune mechanism: Secondary | ICD-10-CM | POA: Insufficient documentation

## 2013-12-05 DIAGNOSIS — Z79899 Other long term (current) drug therapy: Secondary | ICD-10-CM | POA: Insufficient documentation

## 2013-12-05 DIAGNOSIS — Z9889 Other specified postprocedural states: Secondary | ICD-10-CM | POA: Insufficient documentation

## 2013-12-05 DIAGNOSIS — Z9089 Acquired absence of other organs: Secondary | ICD-10-CM | POA: Insufficient documentation

## 2013-12-05 DIAGNOSIS — K219 Gastro-esophageal reflux disease without esophagitis: Secondary | ICD-10-CM | POA: Insufficient documentation

## 2013-12-05 DIAGNOSIS — Z3202 Encounter for pregnancy test, result negative: Secondary | ICD-10-CM | POA: Insufficient documentation

## 2013-12-05 DIAGNOSIS — K3184 Gastroparesis: Secondary | ICD-10-CM | POA: Insufficient documentation

## 2013-12-05 DIAGNOSIS — Z8709 Personal history of other diseases of the respiratory system: Secondary | ICD-10-CM | POA: Insufficient documentation

## 2013-12-05 DIAGNOSIS — F3289 Other specified depressive episodes: Secondary | ICD-10-CM | POA: Insufficient documentation

## 2013-12-05 DIAGNOSIS — Z8619 Personal history of other infectious and parasitic diseases: Secondary | ICD-10-CM | POA: Insufficient documentation

## 2013-12-05 DIAGNOSIS — I10 Essential (primary) hypertension: Secondary | ICD-10-CM

## 2013-12-05 DIAGNOSIS — Z8639 Personal history of other endocrine, nutritional and metabolic disease: Secondary | ICD-10-CM | POA: Insufficient documentation

## 2013-12-05 DIAGNOSIS — F329 Major depressive disorder, single episode, unspecified: Secondary | ICD-10-CM | POA: Insufficient documentation

## 2013-12-05 DIAGNOSIS — Z8711 Personal history of peptic ulcer disease: Secondary | ICD-10-CM | POA: Insufficient documentation

## 2013-12-05 DIAGNOSIS — Z975 Presence of (intrauterine) contraceptive device: Secondary | ICD-10-CM | POA: Insufficient documentation

## 2013-12-05 DIAGNOSIS — F17200 Nicotine dependence, unspecified, uncomplicated: Secondary | ICD-10-CM | POA: Insufficient documentation

## 2013-12-05 DIAGNOSIS — Z8739 Personal history of other diseases of the musculoskeletal system and connective tissue: Secondary | ICD-10-CM | POA: Insufficient documentation

## 2013-12-05 DIAGNOSIS — R109 Unspecified abdominal pain: Secondary | ICD-10-CM

## 2013-12-05 LAB — COMPREHENSIVE METABOLIC PANEL
ALT: 11 U/L (ref 0–35)
AST: 12 U/L (ref 0–37)
Albumin: 4.5 g/dL (ref 3.5–5.2)
Alkaline Phosphatase: 72 U/L (ref 39–117)
BUN: 9 mg/dL (ref 6–23)
CALCIUM: 9.7 mg/dL (ref 8.4–10.5)
CO2: 25 meq/L (ref 19–32)
Chloride: 95 mEq/L — ABNORMAL LOW (ref 96–112)
Creatinine, Ser: 0.69 mg/dL (ref 0.50–1.10)
GLUCOSE: 100 mg/dL — AB (ref 70–99)
Potassium: 4.1 mEq/L (ref 3.7–5.3)
Sodium: 133 mEq/L — ABNORMAL LOW (ref 137–147)
Total Bilirubin: 0.6 mg/dL (ref 0.3–1.2)
Total Protein: 7.7 g/dL (ref 6.0–8.3)

## 2013-12-05 LAB — PREGNANCY, URINE: Preg Test, Ur: NEGATIVE

## 2013-12-05 LAB — CBC WITH DIFFERENTIAL/PLATELET
BASOS ABS: 0 10*3/uL (ref 0.0–0.1)
Basophils Relative: 0 % (ref 0–1)
EOS PCT: 1 % (ref 0–5)
Eosinophils Absolute: 0.1 10*3/uL (ref 0.0–0.7)
HCT: 41 % (ref 36.0–46.0)
Hemoglobin: 14 g/dL (ref 12.0–15.0)
LYMPHS ABS: 3.6 10*3/uL (ref 0.7–4.0)
Lymphocytes Relative: 25 % (ref 12–46)
MCH: 29.4 pg (ref 26.0–34.0)
MCHC: 34.1 g/dL (ref 30.0–36.0)
MCV: 86.1 fL (ref 78.0–100.0)
Monocytes Absolute: 1.1 10*3/uL — ABNORMAL HIGH (ref 0.1–1.0)
Monocytes Relative: 7 % (ref 3–12)
Neutro Abs: 9.4 10*3/uL — ABNORMAL HIGH (ref 1.7–7.7)
Neutrophils Relative %: 67 % (ref 43–77)
PLATELETS: 350 10*3/uL (ref 150–400)
RBC: 4.76 MIL/uL (ref 3.87–5.11)
RDW: 14.4 % (ref 11.5–15.5)
WBC: 14.1 10*3/uL — AB (ref 4.0–10.5)

## 2013-12-05 LAB — URINALYSIS, ROUTINE W REFLEX MICROSCOPIC
Glucose, UA: NEGATIVE mg/dL
Hgb urine dipstick: NEGATIVE
Ketones, ur: NEGATIVE mg/dL
Leukocytes, UA: NEGATIVE
Nitrite: NEGATIVE
PROTEIN: NEGATIVE mg/dL
Specific Gravity, Urine: 1.007 (ref 1.005–1.030)
UROBILINOGEN UA: 0.2 mg/dL (ref 0.0–1.0)
pH: 6 (ref 5.0–8.0)

## 2013-12-05 LAB — LIPASE, BLOOD: Lipase: 28 U/L (ref 11–59)

## 2013-12-05 MED ORDER — ONDANSETRON HCL 4 MG/2ML IJ SOLN
4.0000 mg | Freq: Once | INTRAMUSCULAR | Status: AC
  Administered 2013-12-05: 4 mg via INTRAVENOUS
  Filled 2013-12-05: qty 2

## 2013-12-05 MED ORDER — ONDANSETRON 4 MG PO TBDP: 4.0000 mg | ORAL_TABLET | Freq: Three times a day (TID) | ORAL | Status: DC | PRN

## 2013-12-05 MED ORDER — SODIUM CHLORIDE 0.9 % IV BOLUS (SEPSIS)
1000.0000 mL | Freq: Once | INTRAVENOUS | Status: AC
  Administered 2013-12-05: 1000 mL via INTRAVENOUS

## 2013-12-05 MED ORDER — OMEPRAZOLE 20 MG PO CPDR: 40.0000 mg | DELAYED_RELEASE_CAPSULE | Freq: Every day | ORAL | Status: DC

## 2013-12-05 MED ORDER — TRAMADOL HCL 50 MG PO TABS: 50.0000 mg | ORAL_TABLET | Freq: Four times a day (QID) | ORAL | Status: DC | PRN

## 2013-12-05 MED ORDER — TRAMADOL HCL 50 MG PO TABS
50.0000 mg | ORAL_TABLET | Freq: Once | ORAL | Status: AC
  Administered 2013-12-05: 50 mg via ORAL
  Filled 2013-12-05: qty 1

## 2013-12-05 MED ORDER — GI COCKTAIL ~~LOC~~
30.0000 mL | Freq: Once | ORAL | Status: AC
  Administered 2013-12-05: 30 mL via ORAL
  Filled 2013-12-05: qty 30

## 2013-12-05 MED ORDER — ATENOLOL 25 MG PO TABS
25.0000 mg | ORAL_TABLET | Freq: Once | ORAL | Status: AC
  Administered 2013-12-05: 25 mg via ORAL
  Filled 2013-12-05: qty 1

## 2013-12-05 MED ORDER — MORPHINE SULFATE 4 MG/ML IJ SOLN
4.0000 mg | Freq: Once | INTRAMUSCULAR | Status: AC
  Administered 2013-12-05: 4 mg via INTRAVENOUS
  Filled 2013-12-05: qty 1

## 2013-12-05 MED ORDER — FAMOTIDINE 20 MG PO TABS
20.0000 mg | ORAL_TABLET | Freq: Once | ORAL | Status: AC
  Administered 2013-12-05: 20 mg via ORAL
  Filled 2013-12-05: qty 1

## 2013-12-05 NOTE — ED Provider Notes (Signed)
Medical screening examination/treatment/procedure(s) were performed by non-physician practitioner and as supervising physician I was immediately available for consultation/collaboration.   EKG Interpretation None        Ephraim Hamburger, MD 12/05/13 [phone]

## 2013-12-05 NOTE — ED Provider Notes (Signed)
CSN: [id number]     Arrival date & time 12/05/13  0429 History   First MD Initiated Contact with Patient 12/05/13 [phone]     Chief Complaint  Patient presents with  . Abdominal Pain     (Consider location/radiation/quality/duration/timing/severity/associated sxs/prior Treatment) HPI Comments: Selena Farrell is a 53 y.o. female with a past medical history of gastroparesis, PUD, reflux, tobacco abuse,  presenting the Emergency Department with a chief complaint of worsening abdominal pain for 4 days.  The patient describes the pain as non-radiating epigastric, intermittent burning discomfortShe reports the discomfort is aggravated by food.  The patient reports multiple episodes of non-bloody emesis for 4 days.  She reports taking metoclopramide without relief of symptoms.  She reports diarrhea yesterday. Denies BRBPR, dark or melenotic stools. Reports bilateral low back pain since yesterday worse with sitting, relieved by walking.   Colonoscopy and Endoscopy in 2014 GI: Dr. Hilarie Fredrickson PCP: Selena Gravel, MD   Patient is a 53 y.o. female presenting with abdominal pain. The history is provided by the patient and medical records. No language interpreter was used.  Abdominal Pain Associated symptoms: diarrhea, nausea and vomiting   Associated symptoms: no chills, no constipation, no cough, no dysuria, no fever and no hematuria     Past Medical History  Diagnosis Date  . Hypertension   . Gastritis 05/2011    EGD-Dr. Hilarie Fredrickson   . Gastric ulcer 05/2011    EGD-Dr. Hilarie Fredrickson   . Depression   . Nicotine dependence   . Alcohol dependence     Quit Sept. 2012   . Helicobacter pylori (H. pylori) infection     Hx of   . External hemorrhoid   . Delayed gastric emptying   . Choledocholithiasis 06/23/2011    ERCP, shincterotomy and baloon stone extraction by Dr Fuller Plan.   . Bronchitis   . Cough 07/29/2013  . Hyponatremia   . Tenosynovitis     right MC joint   Past Surgical History  Procedure Laterality  Date  . Appendectomy    . Cesarean section      x1  . Esophagogastroduodenoscopy N/A 11/28/2012    Procedure: ESOPHAGOGASTRODUODENOSCOPY (EGD);  Surgeon: Jerene Bears, MD;  Location: [facility];  Service: Gastroenterology;  Laterality: N/A;  . Cholecystectomy N/A 11/29/2012    Procedure: LAPAROSCOPIC CHOLECYSTECTOMY WITH INTRAOPERATIVE CHOLANGIOGRAM;  Surgeon: Earnstine Regal, MD;  Location: [facility];  Service: General;  Laterality: N/A;  . Cervical neck fusion  03/2004    C5-C6 anterior discectomy and fusion with allograft  . Ercp w/ sphicterotomy  06/23/2011    ERCP, shincterotomy and baloon stone extraction by Dr Fuller Plan. 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    Family History  Problem Relation Age of Onset  . Colon cancer Neg Hx   . Alcohol abuse Mother   . Stroke Father   . Cancer Maternal Grandfather     prostate  . Aneurysm Sister    History  Substance Use Topics  . Smoking status: Current Every Day Smoker -- 0.50 packs/day for 25 years    Types: Cigarettes  . Smokeless tobacco: Never Used     Comment: Counseling to quit smoking given to patient in exam room   . Alcohol Use: No     Comment: Heavy drinker quit  Sept 2012    OB History   Grav Para Term Preterm Abortions TAB SAB Ect Mult Living  Review of Systems  Constitutional: Negative for fever and chills.  Respiratory: Negative for cough.   Cardiovascular: Negative for leg swelling.  Gastrointestinal: Positive for nausea, vomiting, abdominal pain and diarrhea. Negative for constipation, blood in stool, anal bleeding and rectal pain.  Genitourinary: Negative for dysuria, urgency, hematuria and flank pain.  Musculoskeletal: Positive for back pain. Negative for gait problem.  Skin: Negative for rash.  Neurological: Negative for weakness and numbness.      Allergies  Aspirin; Celebrex; Nsaids; and Vicodin  Home Medications   Current Outpatient Rx  Name  Route  Sig  Dispense  Refill  .  atenolol (TENORMIN) 25 MG tablet   Oral   Take 25 mg by mouth daily.         . citalopram (CELEXA) 40 MG tablet   Oral   Take 40 mg by mouth daily.         Marland Kitchen docusate sodium (COLACE) 100 MG capsule   Oral   Take 100 mg by mouth daily as needed for mild constipation.         Marland Kitchen levonorgestrel (MIRENA) 20 MCG/24HR IUD   Intrauterine   1 each by Intrauterine route once.         . metoCLOPramide (REGLAN) 10 MG tablet   Oral   Take 1 tablet (10 mg total) by mouth 4 (four) times daily -  before meals and at bedtime.   120 tablet   3   . nicotine (NICODERM CQ - DOSED IN MG/24 HOURS) 21 mg/24hr patch   Transdermal   Place 21 mg onto the skin daily.          BP 162/89  Pulse 71  Temp(Src) 97.9 F (36.6 C) (Oral)  Resp 20  Ht 5\' 4"  (1.626 m)  Wt 141 lb (63.957 kg)  BMI 24.19 kg/m2  SpO2 98% Physical Exam  Nursing note and vitals reviewed. Constitutional: She is oriented to person, place, and time. She appears well-developed and well-nourished. No distress.  HENT:  Head: Normocephalic and atraumatic.  Mouth/Throat: Uvula is midline and oropharynx is clear and moist. Mucous membranes are dry.  Eyes: EOM are normal. Pupils are equal, round, and reactive to light. No scleral icterus.  Neck: Neck supple.  Cardiovascular: Normal rate, regular rhythm and normal heart sounds.   No murmur heard. Pulmonary/Chest: Effort normal and breath sounds normal. She has no wheezes. She has no rales.  Abdominal: Soft. Bowel sounds are normal. There is tenderness in the epigastric area. There is no rebound, no guarding, no CVA tenderness, no tenderness at McBurney's point and negative Murphy's sign.  Musculoskeletal: Normal range of motion. She exhibits no edema.       Lumbar back: She exhibits tenderness. She exhibits no bony tenderness.       Back:  No midline C-spine, T-spine, or L-spine tenderness with no step-offs, crepitus, or deformities noted. Bilateral Low back discomfort  reproducible with palpation of soft tissues, no obvious spasm.  Neurological: She is alert and oriented to person, place, and time. No sensory deficit. Gait normal.  Ambulating in room without assistance.  Skin: Skin is warm and dry. No rash noted. She is not diaphoretic.  Psychiatric: She has a normal mood and affect.    ED Course  Procedures (including critical care time) Labs Review Labs Reviewed  URINALYSIS, ROUTINE W REFLEX MICROSCOPIC - Abnormal; Notable for the following:    Bilirubin Urine SMALL (*)    All other components within normal limits  CBC  WITH DIFFERENTIAL - Abnormal; Notable for the following:    WBC 14.1 (*)    Neutro Abs 9.4 (*)    Monocytes Absolute 1.1 (*)    All other components within normal limits  COMPREHENSIVE METABOLIC PANEL - Abnormal; Notable for the following:    Sodium 133 (*)    Chloride 95 (*)    Glucose, Bld 100 (*)    All other components within normal limits  PREGNANCY, URINE  LIPASE, BLOOD   Imaging Review No results found.   EKG Interpretation None      MDM   Final diagnoses:  Abdominal pain  Reflux  HTN (hypertension)   Pt with a history of PUD and H.pylori infection, followed by Dr. Hilarie Fredrickson, presents with epigastric abdominal discomfort and vomiting, similar to PUD/reflux in the past. Will treat for pain, reflux and vomiting. Re-eval pt reports pain has improved but has returned. CBC shows slight leukocytosis, CMP without concerning abnormalities. lipase negative. UA without signs of infection Re-eval pt resting comfortably, pt requesting more pain medication. No further emesis. Re-eval Pt tolerated oral intake, no further emesis. Pt reports symptom improvement. pt reports she did not take BP medication today, home HTN medications ordered.  Discussed lab results, imaging results, and treatment plan with the patient. Return precautions given. Reports understanding and no other concerns at this time.  Patient is stable for  discharge at this time.   Meds given in ED:  Medications  traMADol (ULTRAM) tablet 50 mg (not administered)  atenolol (TENORMIN) tablet 25 mg (not administered)  famotidine (PEPCID) tablet 20 mg (not administered)  sodium chloride 0.9 % bolus 1,000 mL (0 mLs Intravenous Stopped 12/05/13 0829)  ondansetron (ZOFRAN) injection 4 mg (4 mg Intravenous Given 12/05/13 0709)  morphine 4 MG/ML injection 4 mg (4 mg Intravenous Given 12/05/13 0710)  gi cocktail (Maalox,Lidocaine,Donnatal) (30 mLs Oral Given 12/05/13 0709)  morphine 4 MG/ML injection 4 mg (4 mg Intravenous Given 12/05/13 0832)    New Prescriptions   No medications on file        Lorrine Kin, PA-C 12/05/13 1053

## 2013-12-05 NOTE — ED Notes (Signed)
Pt states she has been vomiting since Sunday, called Dr Fuller Plan and he told her to come to the ED

## 2013-12-05 NOTE — Discharge Instructions (Signed)
Call for a follow up appointment with Dr. Maudie Mercury. Call for an appointment with your gastrointestinal specialist for further evaluation of your abdominal pain. Return if Symptoms worsen.   Take medication as prescribed.

## 2013-12-12 ENCOUNTER — Emergency Department (HOSPITAL_COMMUNITY)
Admission: EM | Admit: 2013-12-12 | Discharge: 2013-12-12 | Disposition: A | Discharge status: Home / Self Care | Payer: BC Managed Care – PPO | Attending: Emergency Medicine | Admitting: Emergency Medicine

## 2013-12-12 ENCOUNTER — Encounter (HOSPITAL_COMMUNITY): Payer: Self-pay | Admitting: Emergency Medicine

## 2013-12-12 ENCOUNTER — Emergency Department (HOSPITAL_COMMUNITY): Payer: BC Managed Care – PPO

## 2013-12-12 DIAGNOSIS — Z8739 Personal history of other diseases of the musculoskeletal system and connective tissue: Secondary | ICD-10-CM | POA: Insufficient documentation

## 2013-12-12 DIAGNOSIS — R638 Other symptoms and signs concerning food and fluid intake: Secondary | ICD-10-CM | POA: Insufficient documentation

## 2013-12-12 DIAGNOSIS — R112 Nausea with vomiting, unspecified: Secondary | ICD-10-CM

## 2013-12-12 DIAGNOSIS — Z79899 Other long term (current) drug therapy: Secondary | ICD-10-CM | POA: Insufficient documentation

## 2013-12-12 DIAGNOSIS — R195 Other fecal abnormalities: Secondary | ICD-10-CM | POA: Insufficient documentation

## 2013-12-12 DIAGNOSIS — R1013 Epigastric pain: Secondary | ICD-10-CM | POA: Insufficient documentation

## 2013-12-12 DIAGNOSIS — Z8619 Personal history of other infectious and parasitic diseases: Secondary | ICD-10-CM | POA: Insufficient documentation

## 2013-12-12 DIAGNOSIS — Z862 Personal history of diseases of the blood and blood-forming organs and certain disorders involving the immune mechanism: Secondary | ICD-10-CM | POA: Insufficient documentation

## 2013-12-12 DIAGNOSIS — F3289 Other specified depressive episodes: Secondary | ICD-10-CM | POA: Insufficient documentation

## 2013-12-12 DIAGNOSIS — F329 Major depressive disorder, single episode, unspecified: Secondary | ICD-10-CM | POA: Insufficient documentation

## 2013-12-12 DIAGNOSIS — Z9089 Acquired absence of other organs: Secondary | ICD-10-CM | POA: Insufficient documentation

## 2013-12-12 DIAGNOSIS — Z8639 Personal history of other endocrine, nutritional and metabolic disease: Secondary | ICD-10-CM | POA: Insufficient documentation

## 2013-12-12 DIAGNOSIS — Z8711 Personal history of peptic ulcer disease: Secondary | ICD-10-CM | POA: Insufficient documentation

## 2013-12-12 DIAGNOSIS — F1021 Alcohol dependence, in remission: Secondary | ICD-10-CM | POA: Insufficient documentation

## 2013-12-12 DIAGNOSIS — K921 Melena: Secondary | ICD-10-CM | POA: Insufficient documentation

## 2013-12-12 DIAGNOSIS — Z9889 Other specified postprocedural states: Secondary | ICD-10-CM | POA: Insufficient documentation

## 2013-12-12 DIAGNOSIS — F17200 Nicotine dependence, unspecified, uncomplicated: Secondary | ICD-10-CM | POA: Insufficient documentation

## 2013-12-12 DIAGNOSIS — R1084 Generalized abdominal pain: Secondary | ICD-10-CM

## 2013-12-12 DIAGNOSIS — R109 Unspecified abdominal pain: Secondary | ICD-10-CM

## 2013-12-12 DIAGNOSIS — Z8709 Personal history of other diseases of the respiratory system: Secondary | ICD-10-CM | POA: Insufficient documentation

## 2013-12-12 DIAGNOSIS — Z8719 Personal history of other diseases of the digestive system: Secondary | ICD-10-CM | POA: Insufficient documentation

## 2013-12-12 DIAGNOSIS — I10 Essential (primary) hypertension: Secondary | ICD-10-CM | POA: Insufficient documentation

## 2013-12-12 LAB — CBC WITH DIFFERENTIAL/PLATELET
Basophils Absolute: 0 10*3/uL (ref 0.0–0.1)
Basophils Relative: 0 % (ref 0–1)
Eosinophils Absolute: 0.2 10*3/uL (ref 0.0–0.7)
Eosinophils Relative: 1 % (ref 0–5)
HCT: 42.8 % (ref 36.0–46.0)
Hemoglobin: 14.7 g/dL (ref 12.0–15.0)
LYMPHS PCT: 21 % (ref 12–46)
Lymphs Abs: 3.5 10*3/uL (ref 0.7–4.0)
MCH: 30.2 pg (ref 26.0–34.0)
MCHC: 34.3 g/dL (ref 30.0–36.0)
MCV: 87.9 fL (ref 78.0–100.0)
MONOS PCT: 9 % (ref 3–12)
Monocytes Absolute: 1.6 10*3/uL — ABNORMAL HIGH (ref 0.1–1.0)
NEUTROS ABS: 11.4 10*3/uL — AB (ref 1.7–7.7)
NEUTROS PCT: 69 % (ref 43–77)
PLATELETS: 372 10*3/uL (ref 150–400)
RBC: 4.87 MIL/uL (ref 3.87–5.11)
RDW: 14.2 % (ref 11.5–15.5)
WBC: 16.6 10*3/uL — AB (ref 4.0–10.5)

## 2013-12-12 LAB — URINE MICROSCOPIC-ADD ON

## 2013-12-12 LAB — URINALYSIS, ROUTINE W REFLEX MICROSCOPIC
BILIRUBIN URINE: NEGATIVE
Glucose, UA: NEGATIVE mg/dL
HGB URINE DIPSTICK: NEGATIVE
KETONES UR: 15 mg/dL — AB
Nitrite: NEGATIVE
Protein, ur: NEGATIVE mg/dL
SPECIFIC GRAVITY, URINE: 1.018 (ref 1.005–1.030)
UROBILINOGEN UA: 0.2 mg/dL (ref 0.0–1.0)
pH: 6 (ref 5.0–8.0)

## 2013-12-12 LAB — COMPREHENSIVE METABOLIC PANEL
ALK PHOS: 134 U/L — AB (ref 39–117)
ALT: 21 U/L (ref 0–35)
AST: 15 U/L (ref 0–37)
Albumin: 4.2 g/dL (ref 3.5–5.2)
BILIRUBIN TOTAL: 0.4 mg/dL (ref 0.3–1.2)
BUN: 12 mg/dL (ref 6–23)
CHLORIDE: 95 meq/L — AB (ref 96–112)
CO2: 26 meq/L (ref 19–32)
Calcium: 9.8 mg/dL (ref 8.4–10.5)
Creatinine, Ser: 0.74 mg/dL (ref 0.50–1.10)
GLUCOSE: 94 mg/dL (ref 70–99)
POTASSIUM: 4 meq/L (ref 3.7–5.3)
SODIUM: 135 meq/L — AB (ref 137–147)
TOTAL PROTEIN: 7.7 g/dL (ref 6.0–8.3)

## 2013-12-12 LAB — LIPASE, BLOOD: Lipase: 22 U/L (ref 11–59)

## 2013-12-12 MED ORDER — HYDROMORPHONE HCL PF 1 MG/ML IJ SOLN
0.5000 mg | Freq: Once | INTRAMUSCULAR | Status: AC
  Administered 2013-12-12: 0.5 mg via INTRAVENOUS
  Filled 2013-12-12: qty 1

## 2013-12-12 MED ORDER — OMEPRAZOLE 20 MG PO CPDR: 40.0000 mg | DELAYED_RELEASE_CAPSULE | Freq: Every day | ORAL | Status: DC

## 2013-12-12 MED ORDER — SODIUM CHLORIDE 0.9 % IV BOLUS (SEPSIS)
500.0000 mL | Freq: Once | INTRAVENOUS | Status: AC
  Administered 2013-12-12: 500 mL via INTRAVENOUS

## 2013-12-12 MED ORDER — LORAZEPAM 1 MG PO TABS: 1.0000 mg | ORAL_TABLET | Freq: Three times a day (TID) | ORAL | Status: DC | PRN

## 2013-12-12 MED ORDER — OXYCODONE-ACETAMINOPHEN 5-325 MG PO TABS: 1.0000 | ORAL_TABLET | Freq: Four times a day (QID) | ORAL | Status: DC | PRN

## 2013-12-12 MED ORDER — METOCLOPRAMIDE HCL 5 MG/ML IJ SOLN
10.0000 mg | Freq: Once | INTRAMUSCULAR | Status: AC
  Administered 2013-12-12: 10 mg via INTRAVENOUS
  Filled 2013-12-12: qty 2

## 2013-12-12 MED ORDER — PROMETHAZINE HCL 25 MG PO TABS: 25.0000 mg | ORAL_TABLET | Freq: Four times a day (QID) | ORAL | Status: DC | PRN

## 2013-12-12 MED ORDER — PROMETHAZINE HCL 25 MG RE SUPP: 25.0000 mg | Freq: Four times a day (QID) | RECTAL | Status: DC | PRN

## 2013-12-12 MED ORDER — FENTANYL CITRATE 0.05 MG/ML IJ SOLN
50.0000 ug | Freq: Once | INTRAMUSCULAR | Status: AC
  Administered 2013-12-12: 50 ug via INTRAVENOUS
  Filled 2013-12-12: qty 2

## 2013-12-12 MED ORDER — ONDANSETRON HCL 4 MG/2ML IJ SOLN
4.0000 mg | Freq: Once | INTRAMUSCULAR | Status: AC
  Administered 2013-12-12: 4 mg via INTRAVENOUS
  Filled 2013-12-12: qty 2

## 2013-12-12 NOTE — ED Notes (Signed)
Pt c/o abd pain, n/v since Sunday. Pt states it eased off, came back and got worse. Pt vomited 4-5 times this morning. States stool has been slightly dark as well.

## 2013-12-12 NOTE — Consult Note (Signed)
Consultation in ER  Referring Provider: Emergency Department (Dr. Alvino Chapel)     Primary Care Physician:  Jani Gravel, MD Primary Gastroenterologist: Zenovia Jarred, MD     Reason for Consultation: Nausea, vomiting and abdominal pain             HPI:   Selena Farrell is a 53 y.o. female is a 53 year old female with a PMH of peptic ulcer disease related H. 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  Still did well for several months but last Thursday developed recurrent burning epigastric pain, nausea and vomiting. Emesis contains food and dark brown liquid. Patient seen in ED for symptoms on 3/26 but discharged home. Symptoms resolved but recurred on Sunday. Patient came back to ED today as she couldn't take the pain anymore. She doesn't have any anti-emetics at home but bought some OTC motion sickness medication which helped.   She is taking Reglan 10mg  TID before meals and at bedtime. Not on a PPI. Stopped anxiolytic a couple of months ago but does take Celexa. She doesn't take NSAIDS. Hgb was 14 on 12/05/13 visit, it is 14.7.   Past Medical History  Diagnosis Date  . Hypertension   . Gastritis 05/2011    EGD-Dr. Hilarie Fredrickson   . Gastric ulcer 05/2011    EGD-Dr. Hilarie Fredrickson   . Depression   . Nicotine dependence   . Alcohol dependence     Quit Sept. 2012   . Helicobacter pylori (H. pylori) infection     Hx of   . External hemorrhoid   . Delayed gastric emptying   . Choledocholithiasis 06/23/2011    ERCP, shincterotomy and baloon stone extraction by Dr Fuller Plan.   . Bronchitis   . Cough 07/29/2013  . Hyponatremia   . Tenosynovitis     right MC joint    Past Surgical History  Procedure Laterality Date  . Appendectomy    . Cesarean section      x1  . Esophagogastroduodenoscopy N/A 11/28/2012    Procedure: ESOPHAGOGASTRODUODENOSCOPY (EGD);  Surgeon:  Jerene Bears, MD;  Location: [facility];  Service: Gastroenterology;  Laterality: N/A;  . Cholecystectomy N/A 11/29/2012    Procedure: LAPAROSCOPIC CHOLECYSTECTOMY WITH INTRAOPERATIVE CHOLANGIOGRAM;  Surgeon: Earnstine Regal, MD;  Location: [facility];  Service: General;  Laterality: N/A;  . Cervical neck fusion  03/2004    C5-C6 anterior discectomy and fusion with allograft  . Ercp w/ sphicterotomy  06/23/2011    ERCP, shincterotomy and baloon stone extraction by Dr Fuller Plan. 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     Family History  Problem Relation Age of Onset  . Colon cancer Neg Hx   . Alcohol abuse Mother   . Stroke Father   . Cancer Maternal Grandfather     prostate  . Aneurysm Sister      History  Substance Use Topics  . Smoking status: Current Every Day Smoker -- 0.50 packs/day for 25 years    Types: Cigarettes  . Smokeless tobacco: Never Used     Comment: Counseling to quit smoking given to patient in exam room   . Alcohol Use: No     Comment: Heavy drinker quit  Sept 2012     Prior to Admission medications   Medication Sig Start Date End Date Taking? Authorizing Provider  atenolol (TENORMIN) 25 MG tablet Take 25 mg by mouth daily.   Yes  Historical Provider, MD  citalopram (CELEXA) 40 MG tablet Take 40 mg by mouth daily.   Yes Historical Provider, MD  docusate sodium (COLACE) 100 MG capsule Take 100 mg by mouth daily as needed for mild constipation.   Yes Historical Provider, MD  levonorgestrel (MIRENA) 20 MCG/24HR IUD 1 each by Intrauterine route once. 12/01/10  Yes Historical Provider, MD  metoCLOPramide (REGLAN) 10 MG tablet Take 1 tablet (10 mg total) by mouth 4 (four) times daily -  before meals and at bedtime. 08/16/13  Yes Willia Craze, NP  nicotine (NICODERM CQ - DOSED IN MG/24 HOURS) 21 mg/24hr patch Place 21 mg onto the skin daily.   Yes Historical Provider, MD  omeprazole (PRILOSEC) 20 MG capsule Take 2 capsules (40 mg total) by mouth daily. 12/05/13  Yes  Lauren Burnetta Sabin, PA-C  ondansetron (ZOFRAN ODT) 4 MG disintegrating tablet Take 1 tablet (4 mg total) by mouth every 8 (eight) hours as needed for nausea. 12/05/13  Yes Lauren Burnetta Sabin, PA-C  temazepam (RESTORIL) 30 MG capsule Take 30 mg by mouth at bedtime as needed for sleep.   Yes Historical Provider, MD  traMADol (ULTRAM) 50 MG tablet Take 1 tablet (50 mg total) by mouth every 6 (six) hours as needed. 12/05/13  Yes Lauren Burnetta Sabin, PA-C  oxyCODONE-acetaminophen (PERCOCET/ROXICET) 5-325 MG per tablet Take 1-2 tablets by mouth every 6 (six) hours as needed for severe pain. 12/12/13   Jasper Riling. Alvino Chapel, MD  promethazine (PHENERGAN) 25 MG suppository Place 1 suppository (25 mg total) rectally every 6 (six) hours as needed for nausea or vomiting. 12/12/13   Jasper Riling. Alvino Chapel, MD    No current facility-administered medications for this encounter.   Current Outpatient Prescriptions  Medication Sig Dispense Refill  . atenolol (TENORMIN) 25 MG tablet Take 25 mg by mouth daily.      . citalopram (CELEXA) 40 MG tablet Take 40 mg by mouth daily.      Marland Kitchen docusate sodium (COLACE) 100 MG capsule Take 100 mg by mouth daily as needed for mild constipation.      Marland Kitchen levonorgestrel (MIRENA) 20 MCG/24HR IUD 1 each by Intrauterine route once.      . metoCLOPramide (REGLAN) 10 MG tablet Take 1 tablet (10 mg total) by mouth 4 (four) times daily -  before meals and at bedtime.  120 tablet  3  . nicotine (NICODERM CQ - DOSED IN MG/24 HOURS) 21 mg/24hr patch Place 21 mg onto the skin daily.      Marland Kitchen omeprazole (PRILOSEC) 20 MG capsule Take 2 capsules (40 mg total) by mouth daily.  30 capsule  0  . ondansetron (ZOFRAN ODT) 4 MG disintegrating tablet Take 1 tablet (4 mg total) by mouth every 8 (eight) hours as needed for nausea.  10 tablet  0  . temazepam (RESTORIL) 30 MG capsule Take 30 mg by mouth at bedtime as needed for sleep.      . traMADol (ULTRAM) 50 MG tablet Take 1 tablet (50 mg total) by mouth every 6 (six) hours  as needed.  10 tablet  0  . oxyCODONE-acetaminophen (PERCOCET/ROXICET) 5-325 MG per tablet Take 1-2 tablets by mouth every 6 (six) hours as needed for severe pain.  10 tablet  0  . promethazine (PHENERGAN) 25 MG suppository Place 1 suppository (25 mg total) rectally every 6 (six) hours as needed for nausea or vomiting.  12 each  0    Allergies as of 12/12/2013 - Review Complete 12/12/2013  Allergen  Reaction Noted  . Aspirin Other (See Comments) 06/14/2011  . Celebrex [celecoxib] Nausea Only 09/17/2013  . Nsaids Nausea Only 09/17/2013  . Vicodin [hydrocodone-acetaminophen] Nausea And Vomiting 06/14/2011    Review of Systems:    All systems reviewed and negative except where noted in HPI.   Physical Exam:  Vital signs in last 24 hours: Temp:  [97.8 F (36.6 C)-98.4 F (36.9 C)] 97.8 F (36.6 C) (04/02 1513) Pulse Rate:  [58-63] 63 (04/02 1513) Resp:  [16-20] 16 (04/02 1513) BP: (124-156)/(72-90) 156/90 mmHg (04/02 1513) SpO2:  [95 %-98 %] 96 % (04/02 1513)   General:   Pleasant white female in NAD Head:  Normocephalic and atraumatic. Eyes:   No icterus.   Conjunctiva pink. Ears:  Normal auditory acuity. Neck:  Supple; no masses felt Lungs:  Respirations even and unlabored. Lungs clear to auscultation bilaterally.   No wheezes, crackles, or rhonchi.  Heart:  Regular rate and rhythm;  murmur heard. Abdomen:  Soft, nondistended, moderate mid upper abdominal tenderness. Normal bowel sounds. No appreciable masses or hepatomegaly.  Rectal:  Not performed.  Msk:  Symmetrical without gross deformities.  Extremities:  Without edema. Neurologic:  Alert and  oriented x4;  grossly normal neurologically. Skin:  Intact without significant lesions or rashes. Cervical Nodes:  No significant cervical adenopathy. Psych:  Alert. Emotional, crying.  LAB RESULTS:  Recent Labs  12/12/13 0923  WBC 16.6*  HGB 14.7  HCT 42.8  PLT 372   BMET  Recent Labs  12/12/13 0923  NA 135*  K 4.0    CL 95*  CO2 26  GLUCOSE 94  BUN 12  CREATININE 0.74  CALCIUM 9.8   LFT  Recent Labs  12/12/13 0923  PROT 7.7  ALBUMIN 4.2  AST 15  ALT 21  ALKPHOS 134*  BILITOT 0.4    STUDIES: Dg Abd 2 Views  12/12/2013   CLINICAL DATA:  Abdominal pain. Post appendectomy an cholecystectomy. History of gastritis, gastric ulcer and delayed gastric emptying.  EXAM: ABDOMEN - 2 VIEW  COMPARISON:  11/23/2012.  FINDINGS: Gas-filled top-normal size transverse colon. No plain film evidence of bowel obstruction or free intraperitoneal air.  Post cholecystectomy.  IUD is in place.  Mild scoliosis.  Prominent nipple shadows.  IMPRESSION: No plain film evidence of bowel obstruction or free intraperitoneal air.  Please see above.   Electronically Signed   By: Chauncey Cruel M.D.   On: 12/12/2013 11:20   PREVIOUS ENDOSCOPIES:            Colonoscopy April 2014 for bowel changes and abdominal pain  COLON FINDINGS: The mucosa appeared normal in the terminal ileum.  A 5 mm ulcer was found at the ileocecal valve. Multiple biopsies  of the area were performed using cold forceps. Two sessile polyps  measuring 3 and 6 mm in size were found in the rectosigmoid colon.  Polypectomy was performed using cold snare. All resections were  complete and all polyp tissue was completely retrieved. Large  external hemorrhoids were found. The colon mucosa was otherwise  normal without evidence of inflammation or colitis. Retroflexed  views revealed external hemorrhoids. The time to cecum=2 minutes 45  seconds. Withdrawal time=17 minutes 00 seconds. The scope was  withdrawn and the procedure completed.   Surgical [P], ileocecal valve ulcer, bx - BENIGN COLONIC MUCOSA WITH SURFACE EROSION. - NO EVIDENCE OF ACTIVE INFLAMMATION OR CHRONICITY. - NO DYSPLASIA OR MALIGNANCY. 2. Surgical [P], recto-sigmoid polyps x 2 (fragmented) - MULTIPLE FRAGMENTS OF  HYPERPLASTIC POLYP AND A POLYPOID FRAGMENT OF BENIGN COLONIC MUCOSA WITH  INTRAMUCOSAL LYMPHOID AGGREGATE. NO ADENOMATOUS CHANGES OR MALIGNANCY   EGD March 2014 Mild antral gastropathy. Abnormal Z line.  Biopsies c/w chronic gastritis.    Impression / Plan:   51. 53 year old female with chronic, intermittent nausea, vomiting and epigastric pain. She has these episodes from time to time and was hospitalized several days last summer with same symptoms.  At that time she had been off meds due to cost. She has definitely been taking home QID Reglan but stopped PPI at some point and also stopped Ativan two months ago.   2. History of PUD secondary to H.pylori.   3. 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 Hx of chronic cholecystitis, status post laparoscopic cholecystectomy in March 2014  5. Gastroparesis, manages with QID Reglan at home.    6. History of anxiety / depression. On Celexa. Stopped anxiolytic a couple of months.   Her medications are not correct. She isn't taking ultram or narcotics. I will get this corrected. She isn't using nicotine patch.  Thanks   LOS: 0 days   Selena Farrell  12/12/2013, 3:39 PM   ________________________________________________________________________  [facility] Heckler GI MD note:  I personally examined the patient, reviewed the data and agree with the assessment and plan described above. She has been off her PPIs and her anxiolytics recently.  Labs are normal for her (chronically elevated WBC that was worked up by heme in past).  She is very tearful but I don't think anything serious is going on, suspect mild GERD with a lot of psychologic overlay (cannot afford meds, having real trouble with pay at work). She does not require inpatient admission.  She needs to be on PPI twice daily (OTC omeprazole, pantoprazole, esomeprazole will work will, one pill 20-30 min before breakfast and dinner meals). She needs to be taking H2 blocker at bedtime every night (randitine). She should continue her reglan 1  pill 3-4 times day. She should restart her ativan at her previous dose.  Should start phenergan suppositories that she can use as needed. Social work consultation in ER could be helpful since she tells me she cannot afford any medicines (OTC or otherwise).  I advised her to stay on clear liquids for another 2-3 days before trying more solid, substantial diet.   Owens Loffler, MD [facility] Pager [phone]

## 2013-12-12 NOTE — ED Notes (Signed)
Patient states that GI PA has not seen her, states she refuses to leave ED until more care is given. Selena Farrell, [place], made aware. Selena Farrell visited patient at bedside, states plan to contact GI specialist.

## 2013-12-12 NOTE — ED Provider Notes (Addendum)
CSN: [id number]     Arrival date & time 12/12/13  0844 History   First MD Initiated Contact with Patient 12/12/13 [phone]     Chief Complaint  Patient presents with  . Abdominal Pain  . Nausea  . Emesis     (Consider location/radiation/quality/duration/timing/severity/associated sxs/prior Treatment) Patient is a 53 y.o. female presenting with abdominal pain and vomiting. The history is provided by the patient.  Abdominal Pain Associated symptoms: nausea and vomiting   Associated symptoms: no chest pain, no diarrhea, no fever and no shortness of breath   Emesis Associated symptoms: abdominal pain   Associated symptoms: no diarrhea and no headaches    patient with upper abdominal pain. Had a similar episode last week he was seen in ER. She states she got better but has been worse for the last day or 2. She's had nausea vomiting. She states she's had dark stool and thrown up some dark emesis. She has a history of gastritis and gastric ulcers. She sees Dr. Hilarie Fredrickson states she was told to come to the ER for this. No fevers. The pain is dull and constant. No lightheadedness or dizziness. She is on Reglan Protonix, and states she's been taking both. No other bleeding.  Past Medical History  Diagnosis Date  . Hypertension   . Gastritis 05/2011    EGD-Dr. Hilarie Fredrickson   . Gastric ulcer 05/2011    EGD-Dr. Hilarie Fredrickson   . Depression   . Nicotine dependence   . Alcohol dependence     Quit Sept. 2012   . Helicobacter pylori (H. pylori) infection     Hx of   . External hemorrhoid   . Delayed gastric emptying   . Choledocholithiasis 06/23/2011    ERCP, shincterotomy and baloon stone extraction by Dr Fuller Plan.   . Bronchitis   . Cough 07/29/2013  . Hyponatremia   . Tenosynovitis     right MC joint   Past Surgical History  Procedure Laterality Date  . Appendectomy    . Cesarean section      x1  . Esophagogastroduodenoscopy N/A 11/28/2012    Procedure: ESOPHAGOGASTRODUODENOSCOPY (EGD);  Surgeon: Jerene Bears, MD;  Location: [facility];  Service: Gastroenterology;  Laterality: N/A;  . Cholecystectomy N/A 11/29/2012    Procedure: LAPAROSCOPIC CHOLECYSTECTOMY WITH INTRAOPERATIVE CHOLANGIOGRAM;  Surgeon: Earnstine Regal, MD;  Location: [facility];  Service: General;  Laterality: N/A;  . Cervical neck fusion  03/2004    C5-C6 anterior discectomy and fusion with allograft  . Ercp w/ sphicterotomy  06/23/2011    ERCP, shincterotomy and baloon stone extraction by Dr Fuller Plan. 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    Family History  Problem Relation Age of Onset  . Colon cancer Neg Hx   . Alcohol abuse Mother   . Stroke Father   . Cancer Maternal Grandfather     prostate  . Aneurysm Sister    History  Substance Use Topics  . Smoking status: Current Every Day Smoker -- 0.50 packs/day for 25 years    Types: Cigarettes  . Smokeless tobacco: Never Used     Comment: Counseling to quit smoking given to patient in exam room   . Alcohol Use: No     Comment: Heavy drinker quit  Sept 2012    OB History   Grav Para Term Preterm Abortions TAB SAB Ect Mult Living                 Review of Systems  Constitutional: Positive  for appetite change. Negative for fever and activity change.  Eyes: Negative for pain.  Respiratory: Negative for chest tightness and shortness of breath.   Cardiovascular: Negative for chest pain and leg swelling.  Gastrointestinal: Positive for nausea, vomiting, abdominal pain and blood in stool. Negative for diarrhea.  Genitourinary: Negative for flank pain.  Musculoskeletal: Negative for back pain and neck stiffness.  Skin: Negative for rash.  Neurological: Negative for weakness, numbness and headaches.  Hematological: Does not bruise/bleed easily.  Psychiatric/Behavioral: Negative for behavioral problems.      Allergies  Aspirin; Celebrex; Nsaids; and Vicodin  Home Medications   Current Outpatient Rx  Name  Route  Sig  Dispense  Refill  . atenolol  (TENORMIN) 25 MG tablet   Oral   Take 25 mg by mouth daily.         . citalopram (CELEXA) 40 MG tablet   Oral   Take 40 mg by mouth daily.         Marland Kitchen docusate sodium (COLACE) 100 MG capsule   Oral   Take 100 mg by mouth daily as needed for mild constipation.         Marland Kitchen levonorgestrel (MIRENA) 20 MCG/24HR IUD   Intrauterine   1 each by Intrauterine route once.         . metoCLOPramide (REGLAN) 10 MG tablet   Oral   Take 1 tablet (10 mg total) by mouth 4 (four) times daily -  before meals and at bedtime.   120 tablet   3   . nicotine (NICODERM CQ - DOSED IN MG/24 HOURS) 21 mg/24hr patch   Transdermal   Place 21 mg onto the skin daily.         Marland Kitchen omeprazole (PRILOSEC) 20 MG capsule   Oral   Take 2 capsules (40 mg total) by mouth daily.   30 capsule   0   . ondansetron (ZOFRAN ODT) 4 MG disintegrating tablet   Oral   Take 1 tablet (4 mg total) by mouth every 8 (eight) hours as needed for nausea.   10 tablet   0   . temazepam (RESTORIL) 30 MG capsule   Oral   Take 30 mg by mouth at bedtime as needed for sleep.         . traMADol (ULTRAM) 50 MG tablet   Oral   Take 1 tablet (50 mg total) by mouth every 6 (six) hours as needed.   10 tablet   0   . oxyCODONE-acetaminophen (PERCOCET/ROXICET) 5-325 MG per tablet   Oral   Take 1-2 tablets by mouth every 6 (six) hours as needed for severe pain.   10 tablet   0   . promethazine (PHENERGAN) 25 MG suppository   Rectal   Place 1 suppository (25 mg total) rectally every 6 (six) hours as needed for nausea or vomiting.   12 each   0    BP 156/90  Pulse 63  Temp(Src) 97.8 F (36.6 C) (Oral)  Resp 16  SpO2 96% Physical Exam  Nursing note and vitals reviewed. Constitutional: She is oriented to person, place, and time. She appears well-developed and well-nourished.  HENT:  Head: Normocephalic and atraumatic.  Mouth/Throat: No oropharyngeal exudate.  No posterior pharyngeal bleeding.  Eyes: EOM are normal.  Pupils are equal, round, and reactive to light.  Neck: Normal range of motion. Neck supple.  Cardiovascular: Normal rate, regular rhythm and normal heart sounds.   No murmur heard. Pulmonary/Chest: Effort normal and  breath sounds normal. No respiratory distress. She has no wheezes. She has no rales.  Abdominal: Soft. She exhibits no distension. There is tenderness. There is no rebound and no guarding.  Epigastric and upper abdominal tenderness without rebound or guarding. No hernias.  Musculoskeletal: Normal range of motion.  Neurological: She is alert and oriented to person, place, and time. No cranial nerve deficit.  Skin: Skin is warm and dry.  Psychiatric: She has a normal mood and affect. Her speech is normal.    ED Course  Procedures (including critical care time) Labs Review Labs Reviewed  CBC WITH DIFFERENTIAL - Abnormal; Notable for the following:    WBC 16.6 (*)    Neutro Abs 11.4 (*)    Monocytes Absolute 1.6 (*)    All other components within normal limits  COMPREHENSIVE METABOLIC PANEL - Abnormal; Notable for the following:    Sodium 135 (*)    Chloride 95 (*)    Alkaline Phosphatase 134 (*)    All other components within normal limits  URINALYSIS, ROUTINE W REFLEX MICROSCOPIC - Abnormal; Notable for the following:    APPearance CLOUDY (*)    Ketones, ur 15 (*)    Leukocytes, UA SMALL (*)    All other components within normal limits  URINE MICROSCOPIC-ADD ON - Abnormal; Notable for the following:    Squamous Epithelial / LPF FEW (*)    Bacteria, UA MANY (*)    All other components within normal limits  LIPASE, BLOOD  POC OCCULT BLOOD, ED   Imaging Review Dg Abd 2 Views  12/12/2013   CLINICAL DATA:  Abdominal pain. Post appendectomy an cholecystectomy. History of gastritis, gastric ulcer and delayed gastric emptying.  EXAM: ABDOMEN - 2 VIEW  COMPARISON:  11/23/2012.  FINDINGS: Gas-filled top-normal size transverse colon. No plain film evidence of bowel obstruction  or free intraperitoneal air.  Post cholecystectomy.  IUD is in place.  Mild scoliosis.  Prominent nipple shadows.  IMPRESSION: No plain film evidence of bowel obstruction or free intraperitoneal air.  Please see above.   Electronically Signed   By: Chauncey Cruel M.D.   On: 12/12/2013 11:20     EKG Interpretation None      MDM   Final diagnoses:  Nausea and vomiting  Abdominal pain    Patient with acute on chronic abdominal pain. He has had nausea vomiting with some report of blood. Guaiac-negative. Patient continues have pain. Initially discussed with [facility] gastroenterology. They called back later after discussion with the recommendation of discharge the patient home and stated she did not need admission. I was in the process of discharging the patient when sh refused to leave. She states she never saw gastritis. I discussed with the gastroenterology again and they will see the patient in the ED.     Jasper Riling. Alvino Chapel, MD 12/12/13 1635  Has been seen by [facility] gastroenterology. They've seen the patient and she is willing to be discharged. They recommended some medication adjustments. They requested social work to see the patient since the patient cannot afford any medicine. She has insurance, but cannot afford the co-pay.  Jasper Riling. Alvino Chapel, MD 12/12/13 [phone]

## 2013-12-12 NOTE — ED Notes (Signed)
Made patient aware that we need a urine sample and she stated she is unable to give one at this time.

## 2013-12-12 NOTE — Discharge Instructions (Signed)
Abdominal Pain, Adult Many things can cause abdominal pain. Usually, abdominal pain is not caused by a disease and will improve without treatment. It can often be observed and treated at home. Your health care provider will do a physical exam and possibly order blood tests and X-rays to help determine the seriousness of your pain. However, in many cases, more time must pass before a clear cause of the pain can be found. Before that point, your health care provider may not know if you need more testing or further treatment. HOME CARE INSTRUCTIONS  Monitor your abdominal pain for any changes. The following actions may help to alleviate any discomfort you are experiencing:  Only take over-the-counter or prescription medicines as directed by your health care provider.  Do not take laxatives unless directed to do so by your health care provider.  Try a clear liquid diet (broth, tea, or water) as directed by your health care provider. Slowly move to a bland diet as tolerated. SEEK MEDICAL CARE IF:  You have unexplained abdominal pain.  You have abdominal pain associated with nausea or diarrhea.  You have pain when you urinate or have a bowel movement.  You experience abdominal pain that wakes you in the night.  You have abdominal pain that is worsened or improved by eating food.  You have abdominal pain that is worsened with eating fatty foods. SEEK IMMEDIATE MEDICAL CARE IF:   Your pain does not go away within 2 hours.  You have a fever.  You keep throwing up (vomiting).  Your pain is felt only in portions of the abdomen, such as the right side or the left lower portion of the abdomen.  You pass bloody or black tarry stools. MAKE SURE YOU:  Understand these instructions.   Will watch your condition.   Will get help right away if you are not doing well or get worse.  Document Released: 06/08/2005 Document Revised: 06/19/2013 Document Reviewed: 05/08/2013 [facility] Patient  Information 2014 [place].  Nausea and Vomiting Nausea is a sick feeling that often comes before throwing up (vomiting). Vomiting is a reflex where stomach contents come out of your mouth. Vomiting can cause severe loss of body fluids (dehydration). Children and elderly adults can become dehydrated quickly, especially if they also have diarrhea. Nausea and vomiting are symptoms of a condition or disease. It is important to find the cause of your symptoms. CAUSES   Direct irritation of the stomach lining. This irritation can result from increased acid production (gastroesophageal reflux disease), infection, food poisoning, taking certain medicines (such as nonsteroidal anti-inflammatory drugs), alcohol use, or tobacco use.  Signals from the brain.These signals could be caused by a headache, heat exposure, an inner ear disturbance, increased pressure in the brain from injury, infection, a tumor, or a concussion, pain, emotional stimulus, or metabolic problems.  An obstruction in the gastrointestinal tract (bowel obstruction).  Illnesses such as diabetes, hepatitis, gallbladder problems, appendicitis, kidney problems, cancer, sepsis, atypical symptoms of a heart attack, or eating disorders.  Medical treatments such as chemotherapy and radiation.  Receiving medicine that makes you sleep (general anesthetic) during surgery. DIAGNOSIS Your caregiver may ask for tests to be done if the problems do not improve after a few days. Tests may also be done if symptoms are severe or if the reason for the nausea and vomiting is not clear. Tests may include:  Urine tests.  Blood tests.  Stool tests.  Cultures (to look for evidence of infection).  X-rays or  other imaging studies. °Test results can help your caregiver make decisions about treatment or the need for additional tests. °TREATMENT °You need to stay well hydrated. Drink frequently but in small amounts. You may wish to drink water, sports  drinks, clear broth, or eat frozen ice pops or gelatin dessert to help stay hydrated. When you eat, eating slowly may help prevent nausea. There are also some antinausea medicines that may help prevent nausea. °HOME CARE INSTRUCTIONS  °· Take all medicine as directed by your caregiver. °· If you do not have an appetite, do not force yourself to eat. However, you must continue to drink fluids. °· If you have an appetite, eat a normal diet unless your caregiver tells you differently. °· Eat a variety of complex carbohydrates (rice, wheat, potatoes, bread), lean meats, yogurt, fruits, and vegetables. °· Avoid high-fat foods because they are more difficult to digest. °· Drink enough water and fluids to keep your urine clear or pale yellow. °· If you are dehydrated, ask your caregiver for specific rehydration instructions. Signs of dehydration may include: °· Severe thirst. °· Dry lips and mouth. °· Dizziness. °· Dark urine. °· Decreasing urine frequency and amount. °· Confusion. °· Rapid breathing or pulse. °SEEK IMMEDIATE MEDICAL CARE IF:  °· You have blood or brown flecks (like coffee grounds) in your vomit. °· You have black or bloody stools. °· You have a severe headache or stiff neck. °· You are confused. °· You have severe abdominal pain. °· You have chest pain or trouble breathing. °· You do not urinate at least once every 8 hours. °· You develop cold or clammy skin. °· You continue to vomit for longer than 24 to 48 hours. °· You have a fever. °MAKE SURE YOU:  °· Understand these instructions. °· Will watch your condition. °· Will get help right away if you are not doing well or get worse. °Document Released: 08/29/2005 Document Revised: 11/21/2011 Document Reviewed: 01/26/2011 °[employer]® Patient Information ©2014 [employer]. ° °

## 2013-12-12 NOTE — Progress Notes (Signed)
   CARE MANAGEMENT ED NOTE 12/12/2013  Patient:  Selena Farrell, Selena Farrell   Account Number:  [id number]  Date Initiated:  12/12/2013  Documentation initiated by:  Livia Snellen  Subjective/Objective Assessment:   Patient presents to Ed with abdominal pain n/v     Subjective/Objective Assessment Detail:     Action/Plan:   Action/Plan Detail:   Anticipated DC Date:  12/12/2013     Status Recommendation to Physician:   Result of Recommendation:    Other ED Services  Consult Working [place]  CM consult  Medication Assistance  Other    Choice offered to / List presented to:            Status of service:  Completed, signed off  ED Comments:   ED Comments Detail:  [facility] consulted by  EDP to see patient regarding medication assistance.  Patient with [employer]. Patient confirms she has insurance, she doesn't have the funds for her medications.  EDCM provided patient with RX discount card through the [employer] and also RX discount card through [employer]. Also provided patient with financial resources in the community such as local churches and [employer], urban ministries. also provided patient with list of crisis programs in [employer] for [employer]. [facility] reviewed prescriptions to be discharged with patient and explained to patient can be purchased at [facility] for low cost except for phenergan suppository.  When [facility] told patient estimated cost for phenergan suppository, patient stated, "Oh, i don't want that, I will take the pill." EDCM discussed with EDP to change phenergan suppository to pill form per patient request.  [facility] also discussed with EDRN.  Patient thankful for resources.  EDCM offered support to patient.  No furhter EDCM needs at this time.

## 2013-12-18 ENCOUNTER — Emergency Department (HOSPITAL_COMMUNITY)
Admission: EM | Admit: 2013-12-18 | Discharge: 2013-12-18 | Disposition: A | Discharge status: Home / Self Care | Payer: BC Managed Care – PPO | Attending: Emergency Medicine | Admitting: Emergency Medicine

## 2013-12-18 ENCOUNTER — Emergency Department (HOSPITAL_COMMUNITY): Payer: BC Managed Care – PPO

## 2013-12-18 ENCOUNTER — Encounter (HOSPITAL_COMMUNITY): Payer: Self-pay | Admitting: Emergency Medicine

## 2013-12-18 DIAGNOSIS — I10 Essential (primary) hypertension: Secondary | ICD-10-CM | POA: Insufficient documentation

## 2013-12-18 DIAGNOSIS — R197 Diarrhea, unspecified: Secondary | ICD-10-CM | POA: Insufficient documentation

## 2013-12-18 DIAGNOSIS — Z8719 Personal history of other diseases of the digestive system: Secondary | ICD-10-CM | POA: Insufficient documentation

## 2013-12-18 DIAGNOSIS — R1013 Epigastric pain: Secondary | ICD-10-CM

## 2013-12-18 DIAGNOSIS — Z8619 Personal history of other infectious and parasitic diseases: Secondary | ICD-10-CM | POA: Insufficient documentation

## 2013-12-18 DIAGNOSIS — Z8709 Personal history of other diseases of the respiratory system: Secondary | ICD-10-CM | POA: Insufficient documentation

## 2013-12-18 DIAGNOSIS — F329 Major depressive disorder, single episode, unspecified: Secondary | ICD-10-CM | POA: Insufficient documentation

## 2013-12-18 DIAGNOSIS — Z9089 Acquired absence of other organs: Secondary | ICD-10-CM | POA: Insufficient documentation

## 2013-12-18 DIAGNOSIS — F17200 Nicotine dependence, unspecified, uncomplicated: Secondary | ICD-10-CM | POA: Insufficient documentation

## 2013-12-18 DIAGNOSIS — Z8739 Personal history of other diseases of the musculoskeletal system and connective tissue: Secondary | ICD-10-CM | POA: Insufficient documentation

## 2013-12-18 DIAGNOSIS — F3289 Other specified depressive episodes: Secondary | ICD-10-CM | POA: Insufficient documentation

## 2013-12-18 DIAGNOSIS — R079 Chest pain, unspecified: Secondary | ICD-10-CM | POA: Insufficient documentation

## 2013-12-18 DIAGNOSIS — Z9889 Other specified postprocedural states: Secondary | ICD-10-CM | POA: Insufficient documentation

## 2013-12-18 DIAGNOSIS — Z862 Personal history of diseases of the blood and blood-forming organs and certain disorders involving the immune mechanism: Secondary | ICD-10-CM | POA: Insufficient documentation

## 2013-12-18 DIAGNOSIS — Z8639 Personal history of other endocrine, nutritional and metabolic disease: Secondary | ICD-10-CM | POA: Insufficient documentation

## 2013-12-18 DIAGNOSIS — R112 Nausea with vomiting, unspecified: Secondary | ICD-10-CM | POA: Insufficient documentation

## 2013-12-18 LAB — COMPREHENSIVE METABOLIC PANEL
ALBUMIN: 4 g/dL (ref 3.5–5.2)
ALK PHOS: 116 U/L (ref 39–117)
ALT: 17 U/L (ref 0–35)
AST: 19 U/L (ref 0–37)
BUN: 9 mg/dL (ref 6–23)
CO2: 24 mEq/L (ref 19–32)
Calcium: 9.4 mg/dL (ref 8.4–10.5)
Chloride: 96 mEq/L (ref 96–112)
Creatinine, Ser: 0.76 mg/dL (ref 0.50–1.10)
GFR calc non Af Amer: >90 mL/min (ref >90)
GLUCOSE: 112 mg/dL — AB (ref 70–99)
POTASSIUM: 4.2 meq/L (ref 3.7–5.3)
SODIUM: 135 meq/L — AB (ref 137–147)
Total Bilirubin: 0.3 mg/dL (ref 0.3–1.2)
Total Protein: 7.4 g/dL (ref 6.0–8.3)

## 2013-12-18 LAB — CBC
HCT: 41 % (ref 36.0–46.0)
Hemoglobin: 14.4 g/dL (ref 12.0–15.0)
MCH: 30.8 pg (ref 26.0–34.0)
MCHC: 35.1 g/dL (ref 30.0–36.0)
MCV: 87.6 fL (ref 78.0–100.0)
Platelets: 379 10*3/uL (ref 150–400)
RBC: 4.68 MIL/uL (ref 3.87–5.11)
RDW: 14.4 % (ref 11.5–15.5)
WBC: 18.7 10*3/uL — AB (ref 4.0–10.5)

## 2013-12-18 LAB — LIPASE, BLOOD: Lipase: 25 U/L (ref 11–59)

## 2013-12-18 LAB — I-STAT TROPONIN, ED: Troponin i, poc: 0 ng/mL (ref 0.00–0.08)

## 2013-12-18 LAB — TROPONIN I: Troponin I: <0.3 ng/mL (ref <0.30)

## 2013-12-18 MED ORDER — HYDROMORPHONE HCL PF 1 MG/ML IJ SOLN
1.0000 mg | Freq: Once | INTRAMUSCULAR | Status: AC
  Administered 2013-12-18: 1 mg via INTRAVENOUS
  Filled 2013-12-18: qty 1

## 2013-12-18 MED ORDER — SODIUM CHLORIDE 0.9 % IV BOLUS (SEPSIS)
1000.0000 mL | INTRAVENOUS | Status: AC
  Administered 2013-12-18: 1000 mL via INTRAVENOUS

## 2013-12-18 MED ORDER — HYDROMORPHONE HCL PF 1 MG/ML IJ SOLN
1.0000 mg | Freq: Once | INTRAMUSCULAR | Status: AC
Start: 2013-12-18 — End: 2013-12-18
  Administered 2013-12-18: 1 mg via INTRAVENOUS
  Filled 2013-12-18: qty 1

## 2013-12-18 MED ORDER — ONDANSETRON HCL 4 MG/2ML IJ SOLN
4.0000 mg | Freq: Once | INTRAMUSCULAR | Status: AC
  Administered 2013-12-18: 4 mg via INTRAVENOUS
  Filled 2013-12-18: qty 2

## 2013-12-18 MED ORDER — GI COCKTAIL ~~LOC~~
30.0000 mL | Freq: Once | ORAL | Status: AC
  Administered 2013-12-18: 30 mL via ORAL
  Filled 2013-12-18: qty 30

## 2013-12-18 MED ORDER — SODIUM CHLORIDE 0.9 % IV BOLUS (SEPSIS): 500.0000 mL | Freq: Once | INTRAVENOUS | Status: DC

## 2013-12-18 NOTE — ED Notes (Signed)
IV site infiltrated on assessment. New line started and old removed. Slight redness noted around the site.

## 2013-12-18 NOTE — ED Notes (Signed)
Pt returned from radiology, sts her chest pain had decreased but still having abd pain.

## 2013-12-18 NOTE — Discharge Instructions (Signed)
Abdominal Pain, Women °Abdominal (stomach, pelvic, or belly) pain can be caused by many things. It is important to tell your doctor: °· The location of the pain. °· Does it come and go or is it present all the time? °· Are there things that start the pain (eating certain foods, exercise)? °· Are there other symptoms associated with the pain (fever, nausea, vomiting, diarrhea)? °All of this is helpful to know when trying to find the cause of the pain. °CAUSES  °· Stomach: virus or bacteria infection, or ulcer. °· Intestine: appendicitis (inflamed appendix), regional ileitis (Crohn's disease), ulcerative colitis (inflamed colon), irritable bowel syndrome, diverticulitis (inflamed diverticulum of the colon), or cancer of the stomach or intestine. °· Gallbladder disease or stones in the gallbladder. °· Kidney disease, kidney stones, or infection. °· Pancreas infection or cancer. °· Fibromyalgia (pain disorder). °· Diseases of the female organs: °· Uterus: fibroid (non-cancerous) tumors or infection. °· Fallopian tubes: infection or tubal pregnancy. °· Ovary: cysts or tumors. °· Pelvic adhesions (scar tissue). °· Endometriosis (uterus lining tissue growing in the pelvis and on the pelvic organs). °· Pelvic congestion syndrome (female organs filling up with blood just before the menstrual period). °· Pain with the menstrual period. °· Pain with ovulation (producing an egg). °· Pain with an IUD (intrauterine device, birth control) in the uterus. °· Cancer of the female organs. °· Functional pain (pain not caused by a disease, may improve without treatment). °· Psychological pain. °· Depression. °DIAGNOSIS  °Your doctor will decide the seriousness of your pain by doing an examination. °· Blood tests. °· X-rays. °· Ultrasound. °· CT scan (computed tomography, special type of X-ray). °· MRI (magnetic resonance imaging). °· Cultures, for infection. °· Barium enema (dye inserted in the large intestine, to better view it with  X-rays). °· Colonoscopy (looking in intestine with a lighted tube). °· Laparoscopy (minor surgery, looking in abdomen with a lighted tube). °· Major abdominal exploratory surgery (looking in abdomen with a large incision). °TREATMENT  °The treatment will depend on the cause of the pain.  °· Many cases can be observed and treated at home. °· Over-the-counter medicines recommended by your caregiver. °· Prescription medicine. °· Antibiotics, for infection. °· Birth control pills, for painful periods or for ovulation pain. °· Hormone treatment, for endometriosis. °· Nerve blocking injections. °· Physical therapy. °· Antidepressants. °· Counseling with a psychologist or psychiatrist. °· Minor or major surgery. °HOME CARE INSTRUCTIONS  °· Do not take laxatives, unless directed by your caregiver. °· Take over-the-counter pain medicine only if ordered by your caregiver. Do not take aspirin because it can cause an upset stomach or bleeding. °· Try a clear liquid diet (broth or water) as ordered by your caregiver. Slowly move to a bland diet, as tolerated, if the pain is related to the stomach or intestine. °· Have a thermometer and take your temperature several times a day, and record it. °· Bed rest and sleep, if it helps the pain. °· Avoid sexual intercourse, if it causes pain. °· Avoid stressful situations. °· Keep your follow-up appointments and tests, as your caregiver orders. °· If the pain does not go away with medicine or surgery, you may try: °· Acupuncture. °· Relaxation exercises (yoga, meditation). °· Group therapy. °· Counseling. °SEEK MEDICAL CARE IF:  °· You notice certain foods cause stomach pain. °· Your home care treatment is not helping your pain. °· You need stronger pain medicine. °· You want your IUD removed. °· You feel faint or   lightheaded. °· You develop nausea and vomiting. °· You develop a rash. °· You are having side effects or an allergy to your medicine. °SEEK IMMEDIATE MEDICAL CARE IF:  °· Your  pain does not go away or gets worse. °· You have a fever. °· Your pain is felt only in portions of the abdomen. The right side could possibly be appendicitis. The left lower portion of the abdomen could be colitis or diverticulitis. °· You are passing blood in your stools (bright red or black tarry stools, with or without vomiting). °· You have blood in your urine. °· You develop chills, with or without a fever. °· You pass out. °MAKE SURE YOU:  °· Understand these instructions. °· Will watch your condition. °· Will get help right away if you are not doing well or get worse. °Document Released: 06/26/2007 Document Revised: 11/21/2011 Document Reviewed: 07/16/2009 °ExitCare® Patient Information ©2014 [employer]. ° °

## 2013-12-18 NOTE — ED Notes (Signed)
Per EMS - pt c/o abd pain that goes straight up into chest, pain increases with inspiration. Pt c/o nausea, ems administered 4mg  of zofran. Recently seen at [facility] for same. No cardiac hx. Hx of HTN and everyday smoker. 12 lead unremarkable. 40 G in left hand.

## 2013-12-18 NOTE — ED Notes (Signed)
Pt c/o intermittent abd pain and nausea X 2 weeks, sts she has been taking Prilosec but hasn't gotten much relief. Pt reports she vomited today around 4am then she started having central chest pain after that, sts the CP feels like a heaviness and has been constant since it started. Nad, skin warm and dry, resp e/u.

## 2013-12-18 NOTE — ED Provider Notes (Addendum)
CSN: [id number]     Arrival date & time 12/18/13  0757 History   First MD Initiated Contact with Patient 12/18/13 0757     Chief Complaint  Patient presents with  . Abdominal Pain     (Consider location/radiation/quality/duration/timing/severity/associated sxs/prior Treatment) Patient is a 53 y.o. female presenting with abdominal pain. The history is provided by the patient.  Abdominal Pain Pain location:  Epigastric Pain quality: aching   Pain radiates to:  Chest Pain severity:  Moderate Onset quality:  Sudden Duration: started at 4am while vomiting. Timing:  Constant Progression:  Unchanged Chronicity:  New Context: retching   Relieved by:  Nothing Worsened by:  Nothing tried Ineffective treatments:  None tried Associated symptoms: chest pain, diarrhea, nausea and vomiting   Associated symptoms: no cough, no dysuria, no fatigue, no fever, no hematuria and no shortness of breath     Past Medical History  Diagnosis Date  . Hypertension   . Gastritis 05/2011    EGD-Dr. Hilarie Fredrickson   . Gastric ulcer 05/2011    EGD-Dr. Hilarie Fredrickson   . Depression   . Nicotine dependence   . Alcohol dependence     Quit Sept. 2012   . Helicobacter pylori (H. pylori) infection     Hx of   . External hemorrhoid   . Delayed gastric emptying   . Choledocholithiasis 06/23/2011    ERCP, shincterotomy and baloon stone extraction by Dr Fuller Plan.   . Bronchitis   . Cough 07/29/2013  . Hyponatremia   . Tenosynovitis     right MC joint   Past Surgical History  Procedure Laterality Date  . Appendectomy    . Cesarean section      x1  . Esophagogastroduodenoscopy N/A 11/28/2012    Procedure: ESOPHAGOGASTRODUODENOSCOPY (EGD);  Surgeon: Jerene Bears, MD;  Location: [facility];  Service: Gastroenterology;  Laterality: N/A;  . Cholecystectomy N/A 11/29/2012    Procedure: LAPAROSCOPIC CHOLECYSTECTOMY WITH INTRAOPERATIVE CHOLANGIOGRAM;  Surgeon: Earnstine Regal, MD;  Location: [facility];  Service: General;   Laterality: N/A;  . Cervical neck fusion  03/2004    C5-C6 anterior discectomy and fusion with allograft  . Ercp w/ sphicterotomy  06/23/2011    ERCP, shincterotomy and baloon stone extraction by Dr Fuller Plan. 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    Family History  Problem Relation Age of Onset  . Colon cancer Neg Hx   . Alcohol abuse Mother   . Stroke Father   . Cancer Maternal Grandfather     prostate  . Aneurysm Sister    History  Substance Use Topics  . Smoking status: Current Every Day Smoker -- 0.50 packs/day for 25 years    Types: Cigarettes  . Smokeless tobacco: Never Used     Comment: Counseling to quit smoking given to patient in exam room   . Alcohol Use: No     Comment: Heavy drinker quit  Sept 2012    OB History   Grav Para Term Preterm Abortions TAB SAB Ect Mult Living                 Review of Systems  Constitutional: Negative for fever and fatigue.  HENT: Negative for congestion and drooling.   Eyes: Negative for pain.  Respiratory: Negative for cough and shortness of breath.   Cardiovascular: Positive for chest pain.  Gastrointestinal: Positive for nausea, vomiting and diarrhea. Negative for abdominal pain.  Genitourinary: Negative for dysuria and hematuria.  Musculoskeletal: Negative for back pain, gait problem  and neck pain.  Skin: Negative for color change.  Neurological: Negative for dizziness and headaches.  Hematological: Negative for adenopathy.  Psychiatric/Behavioral: Negative for behavioral problems.  All other systems reviewed and are negative.     Allergies  Aspirin; Celebrex; Nsaids; and Vicodin  Home Medications   Current Outpatient Rx  Name  Route  Sig  Dispense  Refill  . atenolol (TENORMIN) 25 MG tablet   Oral   Take 25 mg by mouth daily.         . citalopram (CELEXA) 40 MG tablet   Oral   Take 40 mg by mouth daily.         Marland Kitchen docusate sodium (COLACE) 100 MG capsule   Oral   Take 100 mg by mouth daily as  needed for mild constipation.         Marland Kitchen levonorgestrel (MIRENA) 20 MCG/24HR IUD   Intrauterine   1 each by Intrauterine route once.         Marland Kitchen LORazepam (ATIVAN) 1 MG tablet   Oral   Take 1 tablet (1 mg total) by mouth 3 (three) times daily as needed for anxiety.   15 tablet   0   . metoCLOPramide (REGLAN) 10 MG tablet   Oral   Take 1 tablet (10 mg total) by mouth 4 (four) times daily -  before meals and at bedtime.   120 tablet   3   . nicotine (NICODERM CQ - DOSED IN MG/24 HOURS) 21 mg/24hr patch   Transdermal   Place 21 mg onto the skin daily.         Marland Kitchen omeprazole (PRILOSEC) 20 MG capsule   Oral   Take 2 capsules (40 mg total) by mouth daily.   60 capsule   0   . ondansetron (ZOFRAN ODT) 4 MG disintegrating tablet   Oral   Take 1 tablet (4 mg total) by mouth every 8 (eight) hours as needed for nausea.   10 tablet   0   . oxyCODONE-acetaminophen (PERCOCET/ROXICET) 5-325 MG per tablet   Oral   Take 1-2 tablets by mouth every 6 (six) hours as needed for severe pain.   10 tablet   0   . promethazine (PHENERGAN) 25 MG tablet   Oral   Take 1 tablet (25 mg total) by mouth every 6 (six) hours as needed for nausea or vomiting.   15 tablet   0   . temazepam (RESTORIL) 30 MG capsule   Oral   Take 30 mg by mouth at bedtime as needed for sleep.         . traMADol (ULTRAM) 50 MG tablet   Oral   Take 1 tablet (50 mg total) by mouth every 6 (six) hours as needed.   10 tablet   0    BP 135/81  Pulse 64  Temp(Src) 97.7 F (36.5 C) (Oral)  Resp 16  SpO2 96% Physical Exam  Nursing note and vitals reviewed. Constitutional: She is oriented to person, place, and time. She appears well-developed and well-nourished.  HENT:  Head: Normocephalic.  Mouth/Throat: Oropharynx is clear and moist. No oropharyngeal exudate.  Eyes: Conjunctivae and EOM are normal. Pupils are equal, round, and reactive to light.  Neck: Normal range of motion. Neck supple.  Cardiovascular:  Normal rate, regular rhythm, normal heart sounds and intact distal pulses.  Exam reveals no gallop and no friction rub.   No murmur heard. Pulmonary/Chest: Effort normal and breath sounds normal. No respiratory distress.  She has no wheezes.  Abdominal: Soft. Bowel sounds are normal. There is tenderness (mild to mod ttp of epigastric area). There is no rebound and no guarding.  Musculoskeletal: Normal range of motion. She exhibits no edema and no tenderness.  Neurological: She is alert and oriented to person, place, and time.  Skin: Skin is warm and dry.  Psychiatric: She has a normal mood and affect. Her behavior is normal.    ED Course  Procedures (including critical care time) Labs Review Labs Reviewed  CBC - Abnormal; Notable for the following:    WBC 18.7 (*)    All other components within normal limits  COMPREHENSIVE METABOLIC PANEL - Abnormal; Notable for the following:    Sodium 135 (*)    Glucose, Bld 112 (*)    All other components within normal limits  LIPASE, BLOOD  TROPONIN I  I-STAT TROPOININ, ED   Imaging Review Dg Chest 2 View  12/18/2013   CLINICAL DATA:  Epigastric pain radiating to the chest after vomiting. Evaluate for pneumomediastinum or pneumoperitoneum.  EXAM: CHEST  2 VIEW  COMPARISON:  CT CHEST W/O CM dated 08/02/2013; DG CHEST 2V dated 06/04/2013  FINDINGS: Trachea is midline. Heart size normal. No vertically oriented lucencies in the mediastinum to indicate pneumomediastinum. Lungs appear mildly hyperinflated but clear. No pleural fluid. No free air.  Postoperative changes are seen in the lower cervical spine and right upper quadrant.  IMPRESSION: No acute findings. Specifically, no evidence of pneumomediastinum or pneumoperitoneum.   Electronically Signed   By: Lorin Picket M.D.   On: 12/18/2013 08:45     EKG Interpretation   Date/Time:  Wednesday December 18 2013 08:02:06 EDT Ventricular Rate:  64 PR Interval:  158 QRS Duration: 85 QT Interval:   501 QTC Calculation: 517 R Axis:   62 Text Interpretation:  Sinus rhythm ST elev, probable normal early repol  pattern Prolonged QT interval No significant change since last tracing  Confirmed by Jaymond Waage  MD, Jeffrey Voth ([id number]) on 12/18/2013 8:28:41 AM      MDM   Final diagnoses:  Epigastric pain    8:19 AM 53 y.o. female w/ hx of gastritis, gastric ulcer who is been seen twice recently in the last few weeks for epigastric pain who presents with epigastric pain. She states that this is a chronic issue it seemed to worsen last night with some vomiting and diarrhea. She notes her emesis was brown and her stool was brown and watery. She denies any darkness or blood in her emesis/stool. She notes that while vomiting this morning she began having some radiation of the epigastric pain to her chest. Wells neg. Cardiac RF's include FH, smoker, HTN. My suspicion is that her chest pain is likely from vomiting such as a Mallory-Weiss tear. Will get screening lab work and chest x-ray. Will give GI cocktail and get pain control w/ dilaudid.   2:39 PM: Elev wbc, but pt continues to appear well. Labs/imaging otherwise non-contrib. Delta trop neg. She requests frequent dosing of pain medicine but is feeling much better. She is able to tolerate po here in the ED. I suspect this is c/w her recent bouts of gastritis.  I have discussed the diagnosis/risks/treatment options with the patient and believe the pt to be eligible for discharge home to follow-up with her GI doctor. We also discussed returning to the ED immediately if new or worsening sx occur. We discussed the sx which are most concerning (e.g., worsening pain, bloody stools/emesis, fever) that  necessitate immediate return. Medications administered to the patient during their visit and any new prescriptions provided to the patient are listed below.  Medications given during this visit Medications  gi cocktail (Maalox,Lidocaine,Donnatal) (30 mLs Oral Given  12/18/13 0825)  HYDROmorphone (DILAUDID) injection 1 mg (1 mg Intravenous Given 12/18/13 0829)  ondansetron (ZOFRAN) injection 4 mg (4 mg Intravenous Given 12/18/13 0828)  sodium chloride 0.9 % bolus 1,000 mL (0 mLs Intravenous Stopped 12/18/13 1158)  HYDROmorphone (DILAUDID) injection 1 mg (1 mg Intravenous Given 12/18/13 0943)  HYDROmorphone (DILAUDID) injection 1 mg (1 mg Intravenous Given 12/18/13 1043)  HYDROmorphone (DILAUDID) injection 1 mg (1 mg Intravenous Given 12/18/13 1154)  HYDROmorphone (DILAUDID) injection 1 mg (1 mg Intravenous Given 12/18/13 1319)    Discharge Medication List as of 12/18/2013  2:43 PM       Shea Evans Ruthe Mannan, MD 12/18/13 2140  Blanchard Kelch, MD 12/18/13 2140

## 2013-12-19 ENCOUNTER — Encounter (HOSPITAL_COMMUNITY): Payer: Self-pay | Admitting: Emergency Medicine

## 2013-12-19 ENCOUNTER — Emergency Department (HOSPITAL_COMMUNITY)
Admission: EM | Admit: 2013-12-19 | Discharge: 2013-12-19 | Disposition: A | Discharge status: Home / Self Care | Payer: BC Managed Care – PPO | Attending: Emergency Medicine | Admitting: Emergency Medicine

## 2013-12-19 DIAGNOSIS — R05 Cough: Secondary | ICD-10-CM | POA: Insufficient documentation

## 2013-12-19 DIAGNOSIS — Z888 Allergy status to other drugs, medicaments and biological substances status: Secondary | ICD-10-CM | POA: Insufficient documentation

## 2013-12-19 DIAGNOSIS — Z885 Allergy status to narcotic agent status: Secondary | ICD-10-CM | POA: Insufficient documentation

## 2013-12-19 DIAGNOSIS — R112 Nausea with vomiting, unspecified: Secondary | ICD-10-CM

## 2013-12-19 DIAGNOSIS — R109 Unspecified abdominal pain: Secondary | ICD-10-CM

## 2013-12-19 DIAGNOSIS — K250 Acute gastric ulcer with hemorrhage: Secondary | ICD-10-CM | POA: Insufficient documentation

## 2013-12-19 DIAGNOSIS — F3289 Other specified depressive episodes: Secondary | ICD-10-CM | POA: Insufficient documentation

## 2013-12-19 DIAGNOSIS — R1013 Epigastric pain: Secondary | ICD-10-CM

## 2013-12-19 DIAGNOSIS — K8050 Calculus of bile duct without cholangitis or cholecystitis without obstruction: Secondary | ICD-10-CM | POA: Insufficient documentation

## 2013-12-19 DIAGNOSIS — K3189 Other diseases of stomach and duodenum: Secondary | ICD-10-CM | POA: Insufficient documentation

## 2013-12-19 DIAGNOSIS — K2990 Gastroduodenitis, unspecified, without bleeding: Principal | ICD-10-CM

## 2013-12-19 DIAGNOSIS — Z79899 Other long term (current) drug therapy: Secondary | ICD-10-CM | POA: Insufficient documentation

## 2013-12-19 DIAGNOSIS — M659 Unspecified synovitis and tenosynovitis, unspecified site: Secondary | ICD-10-CM | POA: Insufficient documentation

## 2013-12-19 DIAGNOSIS — F17200 Nicotine dependence, unspecified, uncomplicated: Secondary | ICD-10-CM | POA: Insufficient documentation

## 2013-12-19 DIAGNOSIS — R059 Cough, unspecified: Secondary | ICD-10-CM | POA: Insufficient documentation

## 2013-12-19 DIAGNOSIS — K644 Residual hemorrhoidal skin tags: Secondary | ICD-10-CM | POA: Insufficient documentation

## 2013-12-19 DIAGNOSIS — F329 Major depressive disorder, single episode, unspecified: Secondary | ICD-10-CM | POA: Insufficient documentation

## 2013-12-19 DIAGNOSIS — Z8619 Personal history of other infectious and parasitic diseases: Secondary | ICD-10-CM | POA: Insufficient documentation

## 2013-12-19 DIAGNOSIS — E871 Hypo-osmolality and hyponatremia: Secondary | ICD-10-CM | POA: Insufficient documentation

## 2013-12-19 DIAGNOSIS — F1021 Alcohol dependence, in remission: Secondary | ICD-10-CM | POA: Insufficient documentation

## 2013-12-19 DIAGNOSIS — I10 Essential (primary) hypertension: Secondary | ICD-10-CM | POA: Insufficient documentation

## 2013-12-19 DIAGNOSIS — K2970 Gastritis, unspecified, without bleeding: Secondary | ICD-10-CM | POA: Insufficient documentation

## 2013-12-19 LAB — COMPREHENSIVE METABOLIC PANEL
ALK PHOS: 136 U/L — AB (ref 39–117)
ALT: 47 U/L — AB (ref 0–35)
AST: 45 U/L — AB (ref 0–37)
Albumin: 3.9 g/dL (ref 3.5–5.2)
BUN: 9 mg/dL (ref 6–23)
CALCIUM: 9.3 mg/dL (ref 8.4–10.5)
CHLORIDE: 98 meq/L (ref 96–112)
CO2: 24 meq/L (ref 19–32)
Creatinine, Ser: 0.74 mg/dL (ref 0.50–1.10)
GLUCOSE: 114 mg/dL — AB (ref 70–99)
POTASSIUM: 4.1 meq/L (ref 3.7–5.3)
Sodium: 137 mEq/L (ref 137–147)
Total Bilirubin: 0.2 mg/dL — ABNORMAL LOW (ref 0.3–1.2)
Total Protein: 7.1 g/dL (ref 6.0–8.3)

## 2013-12-19 LAB — URINALYSIS, ROUTINE W REFLEX MICROSCOPIC
Bilirubin Urine: NEGATIVE
GLUCOSE, UA: NEGATIVE mg/dL
KETONES UR: NEGATIVE mg/dL
Nitrite: NEGATIVE
PH: 5.5 (ref 5.0–8.0)
Protein, ur: NEGATIVE mg/dL
Specific Gravity, Urine: 1.018 (ref 1.005–1.030)
Urobilinogen, UA: 0.2 mg/dL (ref 0.0–1.0)

## 2013-12-19 LAB — CBC WITH DIFFERENTIAL/PLATELET
BASOS ABS: 0 10*3/uL (ref 0.0–0.1)
Basophils Relative: 0 % (ref 0–1)
Eosinophils Absolute: 0.1 10*3/uL (ref 0.0–0.7)
Eosinophils Relative: 1 % (ref 0–5)
HCT: 41.5 % (ref 36.0–46.0)
Hemoglobin: 14.1 g/dL (ref 12.0–15.0)
LYMPHS ABS: 3.4 10*3/uL (ref 0.7–4.0)
Lymphocytes Relative: 22 % (ref 12–46)
MCH: 30.2 pg (ref 26.0–34.0)
MCHC: 34 g/dL (ref 30.0–36.0)
MCV: 88.9 fL (ref 78.0–100.0)
Monocytes Absolute: 1.2 10*3/uL — ABNORMAL HIGH (ref 0.1–1.0)
Monocytes Relative: 8 % (ref 3–12)
NEUTROS ABS: 10.6 10*3/uL — AB (ref 1.7–7.7)
NEUTROS PCT: 69 % (ref 43–77)
PLATELETS: 391 10*3/uL (ref 150–400)
RBC: 4.67 MIL/uL (ref 3.87–5.11)
RDW: 14.5 % (ref 11.5–15.5)
WBC: 15.3 10*3/uL — ABNORMAL HIGH (ref 4.0–10.5)

## 2013-12-19 LAB — URINE MICROSCOPIC-ADD ON

## 2013-12-19 LAB — POC URINE PREG, ED: Preg Test, Ur: NEGATIVE

## 2013-12-19 LAB — LIPASE, BLOOD: Lipase: 33 U/L (ref 11–59)

## 2013-12-19 MED ORDER — FENTANYL CITRATE 0.05 MG/ML IJ SOLN
50.0000 ug | Freq: Once | INTRAMUSCULAR | Status: AC
  Administered 2013-12-19: 50 ug via INTRAVENOUS
  Filled 2013-12-19: qty 2

## 2013-12-19 MED ORDER — FAMOTIDINE IN NACL 20-0.9 MG/50ML-% IV SOLN
20.0000 mg | Freq: Once | INTRAVENOUS | Status: AC
  Administered 2013-12-19: 20 mg via INTRAVENOUS
  Filled 2013-12-19: qty 50

## 2013-12-19 MED ORDER — SODIUM CHLORIDE 0.9 % IV BOLUS (SEPSIS)
1000.0000 mL | Freq: Once | INTRAVENOUS | Status: AC
  Administered 2013-12-19: 1000 mL via INTRAVENOUS

## 2013-12-19 MED ORDER — PANTOPRAZOLE SODIUM 40 MG IV SOLR
40.0000 mg | INTRAVENOUS | Status: AC
  Administered 2013-12-19: 40 mg via INTRAVENOUS
  Filled 2013-12-19: qty 40

## 2013-12-19 MED ORDER — METOCLOPRAMIDE HCL 5 MG/ML IJ SOLN
10.0000 mg | INTRAMUSCULAR | Status: AC
  Administered 2013-12-19: 10 mg via INTRAVENOUS
  Filled 2013-12-19: qty 2

## 2013-12-19 MED ORDER — MORPHINE SULFATE 4 MG/ML IJ SOLN
4.0000 mg | Freq: Once | INTRAMUSCULAR | Status: AC
  Administered 2013-12-19: 4 mg via INTRAVENOUS
  Filled 2013-12-19: qty 1

## 2013-12-19 NOTE — Discharge Instructions (Signed)
Continue all your home medications. Follow up with  Dr. Hilarie Fredrickson for further management of symptoms. Return to the ED for new concerns.

## 2013-12-19 NOTE — ED Notes (Signed)
RN entered room patient rocking self on ground and guarding abdomen. Patient ambulatory and able to assist self to bed sts position of floor was to help with pain but pt did not get relief. RN gave pain meds and explained purpose. RN provided comfort measures and warm blankets.

## 2013-12-19 NOTE — ED Notes (Signed)
Pt c/o Abdominal pain. Diagnosed with gastritis yesterday. Pain 10-10, that feels like burning. N/V x 4 in last 24 hrs. Diarrhea x 2 in last 24 hrs. States fever and chills.

## 2013-12-19 NOTE — ED Provider Notes (Signed)
Medical screening examination/treatment/procedure(s) were performed by non-physician practitioner and as supervising physician I was immediately available for consultation/collaboration.  Richarda Blade, MD 12/19/13 [phone]

## 2013-12-19 NOTE — ED Provider Notes (Signed)
CSN: [id number]     Arrival date & time 12/19/13  7829 History   First MD Initiated Contact with Patient 12/19/13 [phone]     Chief Complaint  Patient presents with  . Abdominal Pain     (Consider location/radiation/quality/duration/timing/severity/associated sxs/prior Treatment) The history is provided by the patient and medical records.   This is a 53 y.o. F with PMH significant for HTN, recurrent gastritis, gastric ulcers, delayed gastric emptying, presenting to the ED for abdominal pain, nausea, vomiting, and diarrhea.  Pt seen in the ED yesterday, states sx initially improved but got worse again earlier this morning.  States she has had 4 episodes of non-bloody, non-bilious emesis since waking this morning and 2 episodes of nonbloody diarrhea.  Patient states she attempted to take her home Reglan, Prilosec, and blood pressure medication but was unable to keep them down.  Denies any chest pain or shortness of breath.  No fever or chills.  Prior abdominal surgeries include appendectomy, C-section, cholecystectomy, and ERCP with sphincterotomy and stent placement.  Past Medical History  Diagnosis Date  . Hypertension   . Gastritis 05/2011    EGD-Dr. Hilarie Fredrickson   . Gastric ulcer 05/2011    EGD-Dr. Hilarie Fredrickson   . Depression   . Nicotine dependence   . Alcohol dependence     Quit Sept. 2012   . Helicobacter pylori (H. pylori) infection     Hx of   . External hemorrhoid   . Delayed gastric emptying   . Choledocholithiasis 06/23/2011    ERCP, shincterotomy and baloon stone extraction by Dr Fuller Plan.   . Bronchitis   . Cough 07/29/2013  . Hyponatremia   . Tenosynovitis     right MC joint   Past Surgical History  Procedure Laterality Date  . Appendectomy    . Cesarean section      x1  . Esophagogastroduodenoscopy N/A 11/28/2012    Procedure: ESOPHAGOGASTRODUODENOSCOPY (EGD);  Surgeon: Jerene Bears, MD;  Location: [facility];  Service: Gastroenterology;  Laterality: N/A;  . Cholecystectomy  N/A 11/29/2012    Procedure: LAPAROSCOPIC CHOLECYSTECTOMY WITH INTRAOPERATIVE CHOLANGIOGRAM;  Surgeon: Earnstine Regal, MD;  Location: [facility];  Service: General;  Laterality: N/A;  . Cervical neck fusion  03/2004    C5-C6 anterior discectomy and fusion with allograft  . Ercp w/ sphicterotomy  06/23/2011    ERCP, shincterotomy and baloon stone extraction by Dr Fuller Plan. 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    Family History  Problem Relation Age of Onset  . Colon cancer Neg Hx   . Alcohol abuse Mother   . Stroke Father   . Cancer Maternal Grandfather     prostate  . Aneurysm Sister    History  Substance Use Topics  . Smoking status: Current Every Day Smoker -- 0.50 packs/day for 25 years    Types: Cigarettes  . Smokeless tobacco: Never Used     Comment: Counseling to quit smoking given to patient in exam room   . Alcohol Use: No     Comment: Heavy drinker quit  Sept 2012    OB History   Grav Para Term Preterm Abortions TAB SAB Ect Mult Living                 Review of Systems  Gastrointestinal: Positive for nausea, vomiting, abdominal pain and diarrhea.  All other systems reviewed and are negative.     Allergies  Aspirin; Celebrex; Nsaids; and Vicodin  Home Medications   Current Outpatient Rx  Name  Route  Sig  Dispense  Refill  . atenolol (TENORMIN) 25 MG tablet   Oral   Take 25 mg by mouth daily.         . citalopram (CELEXA) 40 MG tablet   Oral   Take 40 mg by mouth daily.         Marland Kitchen docusate sodium (COLACE) 100 MG capsule   Oral   Take 100 mg by mouth daily as needed for mild constipation.         Marland Kitchen LORazepam (ATIVAN) 1 MG tablet   Oral   Take 1 tablet (1 mg total) by mouth 3 (three) times daily as needed for anxiety.   15 tablet   0   . metoCLOPramide (REGLAN) 10 MG tablet   Oral   Take 1 tablet (10 mg total) by mouth 4 (four) times daily -  before meals and at bedtime.   120 tablet   3   . nicotine (NICODERM CQ - DOSED IN MG/24  HOURS) 21 mg/24hr patch   Transdermal   Place 21 mg onto the skin daily.         Marland Kitchen omeprazole (PRILOSEC) 20 MG capsule   Oral   Take 2 capsules (40 mg total) by mouth daily.   60 capsule   0   . oxyCODONE-acetaminophen (PERCOCET/ROXICET) 5-325 MG per tablet   Oral   Take 1-2 tablets by mouth every 6 (six) hours as needed for severe pain.   10 tablet   0   . promethazine (PHENERGAN) 25 MG tablet   Oral   Take 1 tablet (25 mg total) by mouth every 6 (six) hours as needed for nausea or vomiting.   15 tablet   0   . temazepam (RESTORIL) 30 MG capsule   Oral   Take 30 mg by mouth at bedtime as needed for sleep.         . traMADol (ULTRAM) 50 MG tablet   Oral   Take 1 tablet (50 mg total) by mouth every 6 (six) hours as needed.   10 tablet   0   . levonorgestrel (MIRENA) 20 MCG/24HR IUD   Intrauterine   1 each by Intrauterine route once.          BP 124/102  Pulse 69  Temp(Src) 98 F (36.7 C) (Oral)  Resp 20  SpO2 100%  Physical Exam  Nursing note and vitals reviewed. Constitutional: She is oriented to person, place, and time. She appears well-developed and well-nourished. No distress.  Curled up into a ball, sobbing  HENT:  Head: Normocephalic and atraumatic.  Mouth/Throat: Oropharynx is clear and moist.  Eyes: Conjunctivae and EOM are normal. Pupils are equal, round, and reactive to light.  Neck: Normal range of motion.  Cardiovascular: Normal rate, regular rhythm and normal heart sounds.   Pulmonary/Chest: Effort normal and breath sounds normal. No respiratory distress. She has no wheezes.  Abdominal: Soft. Bowel sounds are normal. There is tenderness in the epigastric area and periumbilical area. There is no guarding, no CVA tenderness, no tenderness at McBurney's point and negative Murphy's sign.  Abdomen soft, nondistended, tenderness of periumbilical and epigastrium without rebound or guarding  Musculoskeletal: Normal range of motion.  Neurological:  She is alert and oriented to person, place, and time.  Skin: Skin is warm and dry. She is not diaphoretic.  Psychiatric: She has a normal mood and affect.    ED Course  Procedures (including critical care time) Labs Review  Labs Reviewed  CBC WITH DIFFERENTIAL - Abnormal; Notable for the following:    WBC 15.3 (*)    Neutro Abs 10.6 (*)    Monocytes Absolute 1.2 (*)    All other components within normal limits  COMPREHENSIVE METABOLIC PANEL - Abnormal; Notable for the following:    Glucose, Bld 114 (*)    AST 45 (*)    ALT 47 (*)    Alkaline Phosphatase 136 (*)    Total Bilirubin 0.2 (*)    All other components within normal limits  LIPASE, BLOOD  URINALYSIS, ROUTINE W REFLEX MICROSCOPIC  I-STAT TROPOININ, ED  POC URINE PREG, ED   Imaging Review Dg Chest 2 View  12/18/2013   CLINICAL DATA:  Epigastric pain radiating to the chest after vomiting. Evaluate for pneumomediastinum or pneumoperitoneum.  EXAM: CHEST  2 VIEW  COMPARISON:  CT CHEST W/O CM dated 08/02/2013; DG CHEST 2V dated 06/04/2013  FINDINGS: Trachea is midline. Heart size normal. No vertically oriented lucencies in the mediastinum to indicate pneumomediastinum. Lungs appear mildly hyperinflated but clear. No pleural fluid. No free air.  Postoperative changes are seen in the lower cervical spine and right upper quadrant.  IMPRESSION: No acute findings. Specifically, no evidence of pneumomediastinum or pneumoperitoneum.   Electronically Signed   By: Lorin Picket M.D.   On: 12/18/2013 08:45     EKG Interpretation   Date/Time:  Thursday December 19 2013 07:14:27 EDT Ventricular Rate:  69 PR Interval:  158 QRS Duration: 74 QT Interval:  488 QTC Calculation: 522 R Axis:   74 Text Interpretation:  Normal sinus rhythm Prolonged QT Confirmed by  [facility]  MD, APRIL ([id number]) on 12/19/2013 7:25:43 AM      MDM   Final diagnoses:  Gastritis  Nausea & vomiting  Abdominal pain   53 year old female with chronic  gastritis, seen in the emergency department yesterday for the same. She did have initial improvement of symptoms after yesterday's visit the symptoms worsened again early this morning.  There does not seem to be a change in her symptoms or presentation. Will repeat lab work and aim for sx control.  IVFB, reglan, and pepcid given.    EKG with prolonged QT, pt has hx of same.  Labs as above-- WBC count remains elevated but improved from yesterday.  Mild elevation of transaminases and alkaline phosphatase, bili WNL.  Trop negative. CXR performed yesterday was reviewed and was negative for pneumoperitoneum.  Went into re-assess pt and she was standing beside her bed screaming in pain.  Additional meds ordered.  Pt has been given IV fluids, multiple doses of anti-emetics, PPI, and pain meds with some improvement of symptoms.  She remains afebrile and overall non-toxic appearing.  She has had no active vomiting while in the ED today.  I have advised pt that her sx are chronic in nature and unlikely to be resolved in the ED today.  Encouraged to continue home meds and FU closely with Dr. Hilarie Fredrickson.  Discussed plan with pt, she acknowledged understanding and agreed with plan of care.  Return precautions given.  Larene Pickett, PA-C 12/19/13 [place], PA-C 12/19/13 1544

## 2013-12-27 ENCOUNTER — Emergency Department (HOSPITAL_COMMUNITY)
Admission: EM | Admit: 2013-12-27 | Discharge: 2013-12-27 | Disposition: A | Discharge status: Home / Self Care | Payer: BC Managed Care – PPO | Attending: Emergency Medicine | Admitting: Emergency Medicine

## 2013-12-27 ENCOUNTER — Encounter (HOSPITAL_COMMUNITY): Payer: Self-pay | Admitting: Emergency Medicine

## 2013-12-27 ENCOUNTER — Inpatient Hospital Stay (HOSPITAL_COMMUNITY)
Admission: AD | Admit: 2013-12-27 | Discharge: 2014-01-02 | DRG: 897 | Disposition: A | Discharge status: Home / Self Care | Payer: BC Managed Care – PPO | Source: Intra-hospital | Attending: Psychiatry | Admitting: Psychiatry

## 2013-12-27 DIAGNOSIS — R059 Cough, unspecified: Secondary | ICD-10-CM

## 2013-12-27 DIAGNOSIS — F1320 Sedative, hypnotic or anxiolytic dependence, uncomplicated: Secondary | ICD-10-CM | POA: Diagnosis present

## 2013-12-27 DIAGNOSIS — K2990 Gastroduodenitis, unspecified, without bleeding: Secondary | ICD-10-CM

## 2013-12-27 DIAGNOSIS — I10 Essential (primary) hypertension: Secondary | ICD-10-CM | POA: Insufficient documentation

## 2013-12-27 DIAGNOSIS — Z79899 Other long term (current) drug therapy: Secondary | ICD-10-CM | POA: Insufficient documentation

## 2013-12-27 DIAGNOSIS — Z8619 Personal history of other infectious and parasitic diseases: Secondary | ICD-10-CM | POA: Insufficient documentation

## 2013-12-27 DIAGNOSIS — Z9889 Other specified postprocedural states: Secondary | ICD-10-CM | POA: Insufficient documentation

## 2013-12-27 DIAGNOSIS — R000 Tachycardia, unspecified: Secondary | ICD-10-CM | POA: Insufficient documentation

## 2013-12-27 DIAGNOSIS — Z8639 Personal history of other endocrine, nutritional and metabolic disease: Secondary | ICD-10-CM | POA: Insufficient documentation

## 2013-12-27 DIAGNOSIS — F332 Major depressive disorder, recurrent severe without psychotic features: Secondary | ICD-10-CM | POA: Diagnosis present

## 2013-12-27 DIAGNOSIS — K2970 Gastritis, unspecified, without bleeding: Secondary | ICD-10-CM | POA: Insufficient documentation

## 2013-12-27 DIAGNOSIS — G4700 Insomnia, unspecified: Secondary | ICD-10-CM | POA: Diagnosis present

## 2013-12-27 DIAGNOSIS — R1033 Periumbilical pain: Secondary | ICD-10-CM | POA: Insufficient documentation

## 2013-12-27 DIAGNOSIS — R197 Diarrhea, unspecified: Secondary | ICD-10-CM

## 2013-12-27 DIAGNOSIS — D72829 Elevated white blood cell count, unspecified: Secondary | ICD-10-CM

## 2013-12-27 DIAGNOSIS — R05 Cough: Secondary | ICD-10-CM

## 2013-12-27 DIAGNOSIS — F3289 Other specified depressive episodes: Secondary | ICD-10-CM | POA: Insufficient documentation

## 2013-12-27 DIAGNOSIS — Z8739 Personal history of other diseases of the musculoskeletal system and connective tissue: Secondary | ICD-10-CM | POA: Insufficient documentation

## 2013-12-27 DIAGNOSIS — Z823 Family history of stroke: Secondary | ICD-10-CM

## 2013-12-27 DIAGNOSIS — F329 Major depressive disorder, single episode, unspecified: Secondary | ICD-10-CM | POA: Diagnosis present

## 2013-12-27 DIAGNOSIS — Z862 Personal history of diseases of the blood and blood-forming organs and certain disorders involving the immune mechanism: Secondary | ICD-10-CM | POA: Insufficient documentation

## 2013-12-27 DIAGNOSIS — K644 Residual hemorrhoidal skin tags: Secondary | ICD-10-CM | POA: Insufficient documentation

## 2013-12-27 DIAGNOSIS — F1110 Opioid abuse, uncomplicated: Secondary | ICD-10-CM

## 2013-12-27 DIAGNOSIS — K219 Gastro-esophageal reflux disease without esophagitis: Secondary | ICD-10-CM | POA: Diagnosis present

## 2013-12-27 DIAGNOSIS — F410 Panic disorder [episodic paroxysmal anxiety] without agoraphobia: Secondary | ICD-10-CM | POA: Diagnosis present

## 2013-12-27 DIAGNOSIS — F1120 Opioid dependence, uncomplicated: Principal | ICD-10-CM | POA: Diagnosis present

## 2013-12-27 DIAGNOSIS — G8929 Other chronic pain: Secondary | ICD-10-CM | POA: Insufficient documentation

## 2013-12-27 DIAGNOSIS — F17200 Nicotine dependence, unspecified, uncomplicated: Secondary | ICD-10-CM | POA: Insufficient documentation

## 2013-12-27 DIAGNOSIS — R1013 Epigastric pain: Secondary | ICD-10-CM | POA: Insufficient documentation

## 2013-12-27 DIAGNOSIS — F411 Generalized anxiety disorder: Secondary | ICD-10-CM | POA: Diagnosis present

## 2013-12-27 DIAGNOSIS — Z8709 Personal history of other diseases of the respiratory system: Secondary | ICD-10-CM | POA: Insufficient documentation

## 2013-12-27 DIAGNOSIS — R109 Unspecified abdominal pain: Secondary | ICD-10-CM

## 2013-12-27 DIAGNOSIS — R112 Nausea with vomiting, unspecified: Secondary | ICD-10-CM

## 2013-12-27 LAB — POC OCCULT BLOOD, ED: Fecal Occult Bld: NEGATIVE

## 2013-12-27 LAB — CBC WITH DIFFERENTIAL/PLATELET
Basophils Absolute: 0 10*3/uL (ref 0.0–0.1)
Basophils Relative: 0 % (ref 0–1)
Eosinophils Absolute: 0.1 10*3/uL (ref 0.0–0.7)
Eosinophils Relative: 0 % (ref 0–5)
HCT: 42.5 % (ref 36.0–46.0)
Hemoglobin: 15.4 g/dL — ABNORMAL HIGH (ref 12.0–15.0)
Lymphocytes Relative: 28 % (ref 12–46)
Lymphs Abs: 4.9 10*3/uL — ABNORMAL HIGH (ref 0.7–4.0)
MCH: 31.1 pg (ref 26.0–34.0)
MCHC: 36.2 g/dL — ABNORMAL HIGH (ref 30.0–36.0)
MCV: 85.9 fL (ref 78.0–100.0)
Monocytes Absolute: 1.4 10*3/uL — ABNORMAL HIGH (ref 0.1–1.0)
Monocytes Relative: 8 % (ref 3–12)
Neutro Abs: 11 10*3/uL — ABNORMAL HIGH (ref 1.7–7.7)
Neutrophils Relative %: 64 % (ref 43–77)
Platelets: 516 10*3/uL — ABNORMAL HIGH (ref 150–400)
RBC: 4.95 MIL/uL (ref 3.87–5.11)
RDW: 14.3 % (ref 11.5–15.5)
WBC: 17.4 10*3/uL — ABNORMAL HIGH (ref 4.0–10.5)

## 2013-12-27 LAB — COMPREHENSIVE METABOLIC PANEL
ALT: 14 U/L (ref 0–35)
AST: 18 U/L (ref 0–37)
Albumin: 4 g/dL (ref 3.5–5.2)
Alkaline Phosphatase: 86 U/L (ref 39–117)
BUN: 6 mg/dL (ref 6–23)
CO2: 24 mEq/L (ref 19–32)
Calcium: 8.9 mg/dL (ref 8.4–10.5)
Chloride: 92 mEq/L — ABNORMAL LOW (ref 96–112)
Creatinine, Ser: 0.69 mg/dL (ref 0.50–1.10)
GFR calc Af Amer: >90 mL/min (ref >90)
GFR calc non Af Amer: >90 mL/min (ref >90)
Glucose, Bld: 104 mg/dL — ABNORMAL HIGH (ref 70–99)
POTASSIUM: 3.4 meq/L — AB (ref 3.7–5.3)
SODIUM: 133 meq/L — AB (ref 137–147)
TOTAL PROTEIN: 6.9 g/dL (ref 6.0–8.3)
Total Bilirubin: 0.4 mg/dL (ref 0.3–1.2)

## 2013-12-27 LAB — URINALYSIS, ROUTINE W REFLEX MICROSCOPIC
Bilirubin Urine: NEGATIVE
Glucose, UA: NEGATIVE mg/dL
Hgb urine dipstick: NEGATIVE
Ketones, ur: 15 mg/dL — AB
Leukocytes, UA: NEGATIVE
Nitrite: NEGATIVE
Protein, ur: NEGATIVE mg/dL
Specific Gravity, Urine: 1.011 (ref 1.005–1.030)
Urobilinogen, UA: 0.2 mg/dL (ref 0.0–1.0)
pH: 7 (ref 5.0–8.0)

## 2013-12-27 LAB — LIPASE, BLOOD: Lipase: 30 U/L (ref 11–59)

## 2013-12-27 MED ORDER — HYDROXYZINE HCL 25 MG PO TABS: 25.0000 mg | ORAL_TABLET | Freq: Four times a day (QID) | ORAL | Status: DC | PRN

## 2013-12-27 MED ORDER — DOCUSATE SODIUM 100 MG PO CAPS
100.0000 mg | ORAL_CAPSULE | Freq: Every day | ORAL | Status: DC | PRN
  Administered 2013-12-29: 100 mg via ORAL
  Filled 2013-12-27: qty 1

## 2013-12-27 MED ORDER — ONDANSETRON 4 MG PO TBDP: 4.0000 mg | ORAL_TABLET | Freq: Four times a day (QID) | ORAL | Status: DC | PRN

## 2013-12-27 MED ORDER — CITALOPRAM HYDROBROMIDE 40 MG PO TABS
40.0000 mg | ORAL_TABLET | Freq: Every day | ORAL | Status: DC
  Administered 2013-12-27 – 2014-01-01 (×6): 40 mg via ORAL
  Filled 2013-12-27: qty 2
  Filled 2013-12-27: qty 14
  Filled 2013-12-27 (×6): qty 1
  Filled 2013-12-27: qty 2
  Filled 2013-12-27 (×2): qty 1

## 2013-12-27 MED ORDER — METOCLOPRAMIDE HCL 10 MG PO TABS
10.0000 mg | ORAL_TABLET | Freq: Three times a day (TID) | ORAL | Status: DC
  Administered 2013-12-27: 10 mg via ORAL
  Filled 2013-12-27 (×3): qty 1

## 2013-12-27 MED ORDER — TEMAZEPAM 15 MG PO CAPS
30.0000 mg | ORAL_CAPSULE | Freq: Every evening | ORAL | Status: DC | PRN
  Administered 2013-12-27 – 2014-01-01 (×6): 30 mg via ORAL
  Filled 2013-12-27 (×6): qty 2

## 2013-12-27 MED ORDER — CLONIDINE HCL 0.1 MG PO TABS: 0.1000 mg | ORAL_TABLET | ORAL | Status: DC

## 2013-12-27 MED ORDER — SODIUM CHLORIDE 0.9 % IV BOLUS (SEPSIS)
1000.0000 mL | Freq: Once | INTRAVENOUS | Status: AC
  Administered 2013-12-27: 1000 mL via INTRAVENOUS

## 2013-12-27 MED ORDER — CLONIDINE HCL 0.1 MG PO TABS
0.1000 mg | ORAL_TABLET | Freq: Four times a day (QID) | ORAL | Status: DC
  Administered 2013-12-27: 0.1 mg via ORAL
  Filled 2013-12-27: qty 1

## 2013-12-27 MED ORDER — ALUM & MAG HYDROXIDE-SIMETH 200-200-20 MG/5ML PO SUSP: 30.0000 mL | ORAL | Status: DC | PRN

## 2013-12-27 MED ORDER — MAGNESIUM HYDROXIDE 400 MG/5ML PO SUSP
30.0000 mL | Freq: Every day | ORAL | Status: DC | PRN
  Administered 2013-12-29: 30 mL via ORAL

## 2013-12-27 MED ORDER — TRAMADOL HCL 50 MG PO TABS: 50.0000 mg | ORAL_TABLET | Freq: Four times a day (QID) | ORAL | Status: DC | PRN

## 2013-12-27 MED ORDER — FAMOTIDINE IN NACL 20-0.9 MG/50ML-% IV SOLN
20.0000 mg | Freq: Once | INTRAVENOUS | Status: AC
  Administered 2013-12-27: 20 mg via INTRAVENOUS
  Filled 2013-12-27: qty 50

## 2013-12-27 MED ORDER — ATENOLOL 25 MG PO TABS
25.0000 mg | ORAL_TABLET | Freq: Every day | ORAL | Status: DC
  Administered 2013-12-28 – 2014-01-01 (×5): 25 mg via ORAL
  Filled 2013-12-27 (×8): qty 1
  Filled 2013-12-27: qty 14
  Filled 2013-12-27 (×2): qty 1

## 2013-12-27 MED ORDER — MORPHINE SULFATE 4 MG/ML IJ SOLN
6.0000 mg | Freq: Once | INTRAMUSCULAR | Status: AC
  Administered 2013-12-27: 6 mg via INTRAVENOUS
  Filled 2013-12-27: qty 2

## 2013-12-27 MED ORDER — LOPERAMIDE HCL 2 MG PO CAPS: 2.0000 mg | ORAL_CAPSULE | ORAL | Status: DC | PRN

## 2013-12-27 MED ORDER — ONDANSETRON HCL 4 MG/2ML IJ SOLN
4.0000 mg | Freq: Once | INTRAMUSCULAR | Status: AC
  Administered 2013-12-27: 4 mg via INTRAVENOUS
  Filled 2013-12-27: qty 2

## 2013-12-27 MED ORDER — DICYCLOMINE HCL 20 MG PO TABS
20.0000 mg | ORAL_TABLET | Freq: Four times a day (QID) | ORAL | Status: DC | PRN
  Administered 2013-12-27: 20 mg via ORAL
  Filled 2013-12-27: qty 1

## 2013-12-27 MED ORDER — PANTOPRAZOLE SODIUM 40 MG IV SOLR
40.0000 mg | Freq: Once | INTRAVENOUS | Status: AC
  Administered 2013-12-27: 40 mg via INTRAVENOUS
  Filled 2013-12-27 (×2): qty 40

## 2013-12-27 MED ORDER — LORAZEPAM 1 MG PO TABS: 1.0000 mg | ORAL_TABLET | Freq: Three times a day (TID) | ORAL | Status: DC | PRN

## 2013-12-27 MED ORDER — ONDANSETRON HCL 4 MG PO TABS: 4.0000 mg | ORAL_TABLET | Freq: Three times a day (TID) | ORAL | Status: DC | PRN

## 2013-12-27 MED ORDER — ZOLPIDEM TARTRATE 5 MG PO TABS: 5.0000 mg | ORAL_TABLET | Freq: Every evening | ORAL | Status: DC | PRN

## 2013-12-27 MED ORDER — METHOCARBAMOL 500 MG PO TABS
500.0000 mg | ORAL_TABLET | Freq: Three times a day (TID) | ORAL | Status: DC | PRN
  Administered 2013-12-27: 500 mg via ORAL
  Filled 2013-12-27: qty 1

## 2013-12-27 MED ORDER — GI COCKTAIL ~~LOC~~
30.0000 mL | Freq: Once | ORAL | Status: AC
  Administered 2013-12-27: 30 mL via ORAL
  Filled 2013-12-27: qty 30

## 2013-12-27 MED ORDER — ATENOLOL 25 MG PO TABS
25.0000 mg | ORAL_TABLET | Freq: Every day | ORAL | Status: DC
  Administered 2013-12-27: 25 mg via ORAL
  Filled 2013-12-27: qty 1

## 2013-12-27 MED ORDER — NICOTINE 21 MG/24HR TD PT24
21.0000 mg | MEDICATED_PATCH | Freq: Every day | TRANSDERMAL | Status: DC
  Administered 2013-12-28 – 2014-01-02 (×6): 21 mg via TRANSDERMAL
  Filled 2013-12-27 (×5): qty 1
  Filled 2013-12-27: qty 14
  Filled 2013-12-27 (×5): qty 1

## 2013-12-27 MED ORDER — PANTOPRAZOLE SODIUM 40 MG PO TBEC
40.0000 mg | DELAYED_RELEASE_TABLET | Freq: Every day | ORAL | Status: DC
  Administered 2013-12-27 – 2014-01-01 (×6): 40 mg via ORAL
  Filled 2013-12-27 (×5): qty 1
  Filled 2013-12-27: qty 14
  Filled 2013-12-27 (×5): qty 1

## 2013-12-27 MED ORDER — NICOTINE 21 MG/24HR TD PT24
21.0000 mg | MEDICATED_PATCH | Freq: Every day | TRANSDERMAL | Status: DC
  Administered 2013-12-27: 21 mg via TRANSDERMAL
  Filled 2013-12-27: qty 1

## 2013-12-27 MED ORDER — CLONIDINE HCL 0.1 MG PO TABS: 0.1000 mg | ORAL_TABLET | Freq: Every day | ORAL | Status: DC

## 2013-12-27 NOTE — BH Assessment (Signed)
Writer discussed clinicals with Gertie Fey, Cora prior to seeing patient.

## 2013-12-27 NOTE — Progress Notes (Signed)
Patient ID: Selena Farrell, female   DOB: 10-Jul-1961, 53 y.o.   MRN: [id number]  Admission Note: Patient is a 53yo female admitted Voluntarily to [facility] for depression, chronic abdominal pain and substance abuse. Pt states her health has been declining over the past year due to chronic abdominal pain and has caused her to be admitted several times. Pt states she has weaned herself off of Ativan and Tramadol as well as narcotic pain medications because "They made me feel loopy". Pt states she does not want any pain medications other than Tylenol during her stay at [facility]. Pt states "I had no clue that Ativan was a controlled substance until someone told me, so I slowly quit taking them". Pt states she has lost many loved ones in the past few years, including two of her brothers which passed away in the the past two years and her husband who died seven years ago from cancer. Pt says that she lost her dog recently that she had for 13 years who was "My little companion". Pt lives alone, but has an adult daughter who lives nearby who is a great support to her. Pt states "I'm ready to get out of this depression and get my life back". Pt on short term disability for her recent medical issues and hopes to return to work very soon. Pt denies SI/HI or plans to harm herself. No s/s of distress noted.

## 2013-12-27 NOTE — ED Notes (Signed)
RN called to bedside, pt reports abdominal pain remains at 10/10 with no relief from protonix.  PA-C at bedside.

## 2013-12-27 NOTE — BH Assessment (Signed)
Tele Assessment Note   Selena Farrell is an 53 y.o. female that presents to [facility] initially with medical complaints and pain. Says that she has taken pain medications, started "doubling up", and then due to medical issues stopped taking medications. She reports taking 2-3 varies opiates. Her last use 3 weeks ago. Patient reports ongoing withdrawals such as muscles cramps and aches. Patient stating that due to her on-going pain issues she feels suicidal. She denies plan and intent but unable to contract for safety. Sts, "I don't really have a plan but if I go home I can't tell you what I'll do to myself". Pt has no history of prior suicide attempts. Says that 1-2 weeks ago she did think about harming herself by overdosing on Tylenol. She denies HI. She reports auditory hallucinations in the past 2-3 weeks. Says that she hears a violin. No visual hallucinations. No history of inpatient mental health treatment. No outpatient mental health provider noted.   Axis I: Major Depression, Recurrent severe  Axis II: Deferred Axis III:  Past Medical History  Diagnosis Date  . Hypertension   . Gastritis 05/2011    EGD-Dr. Hilarie Fredrickson   . Gastric ulcer 05/2011    EGD-Dr. Hilarie Fredrickson   . Depression   . Nicotine dependence   . Alcohol dependence     Quit Sept. 2012   . Helicobacter pylori (H. pylori) infection     Hx of   . External hemorrhoid   . Delayed gastric emptying   . Choledocholithiasis 06/23/2011    ERCP, shincterotomy and baloon stone extraction by Dr Fuller Plan.   . Bronchitis   . Cough 07/29/2013  . Hyponatremia   . Tenosynovitis     right MC joint   Axis IV: other psychosocial or environmental problems, problems related to social environment, problems with access to health care services and problems with primary support group Axis V: 31-40 impairment in reality testing  Past Medical History:  Past Medical History  Diagnosis Date  . Hypertension   . Gastritis 05/2011    EGD-Dr. Hilarie Fredrickson   .  Gastric ulcer 05/2011    EGD-Dr. Hilarie Fredrickson   . Depression   . Nicotine dependence   . Alcohol dependence     Quit Sept. 2012   . Helicobacter pylori (H. pylori) infection     Hx of   . External hemorrhoid   . Delayed gastric emptying   . Choledocholithiasis 06/23/2011    ERCP, shincterotomy and baloon stone extraction by Dr Fuller Plan.   . Bronchitis   . Cough 07/29/2013  . Hyponatremia   . Tenosynovitis     right MC joint    Past Surgical History  Procedure Laterality Date  . Appendectomy    . Cesarean section      x1  . Esophagogastroduodenoscopy N/A 11/28/2012    Procedure: ESOPHAGOGASTRODUODENOSCOPY (EGD);  Surgeon: Jerene Bears, MD;  Location: [facility];  Service: Gastroenterology;  Laterality: N/A;  . Cholecystectomy N/A 11/29/2012    Procedure: LAPAROSCOPIC CHOLECYSTECTOMY WITH INTRAOPERATIVE CHOLANGIOGRAM;  Surgeon: Earnstine Regal, MD;  Location: [facility];  Service: General;  Laterality: N/A;  . Cervical neck fusion  03/2004    C5-C6 anterior discectomy and fusion with allograft  . Ercp w/ sphicterotomy  06/23/2011    ERCP, shincterotomy and baloon stone extraction by Dr Fuller Plan. 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     Family History:  Family History  Problem Relation Age of Onset  . Colon cancer Neg Hx   .  Alcohol abuse Mother   . Stroke Father   . Cancer Maternal Grandfather     prostate  . Aneurysm Sister     Social History:  reports that she has been smoking Cigarettes.  She has a 12.5 pack-year smoking history. She has never used smokeless tobacco. She reports that she does not drink alcohol or use illicit drugs.  Additional Social History:  Alcohol / Drug Use Pain Medications: SEE MAR Prescriptions: SEE MAR Over the Counter: SEE MAR History of alcohol / drug use?: No history of alcohol / drug abuse  CIWA: CIWA-Ar BP: 117/76 mmHg Pulse Rate: 72 COWS:    Allergies:  Allergies  Allergen Reactions  . Aspirin Other (See Comments)    Pt has  bleeding ulcer, not allowed to take aspirin  . Celebrex [Celecoxib] Nausea Only  . Nsaids Nausea Only  . Vicodin [Hydrocodone-Acetaminophen] Nausea And Vomiting    Can take with antiemetics.    Home Medications:  (Not in a hospital admission)  OB/GYN Status:  No LMP recorded. Patient is not currently having periods (Reason: IUD).  General Assessment Data Location of Assessment: [facility] ED Is this a Tele or Face-to-Face Assessment?: Tele Assessment Is this an Initial Assessment or a Re-assessment for this encounter?: Initial Assessment Living Arrangements: Alone Can pt return to current living arrangement?: Yes Admission Status: Voluntary Is patient capable of signing voluntary admission?: Yes Transfer from: [facility] Referral Source: Self/Family/Friend  Medical Screening Exam ([place]) Medical Exam completed: No  [place] Living Arrangements: Alone Name of Psychiatrist:  (No psychiatrist ) Name of Therapist:  (No therapist )  Education Status Is patient currently in school?: No  Risk to self Suicidal Ideation: Yes-Currently Present Suicidal Intent: No Is patient at risk for suicide?: No Suicidal Plan?: No Access to Means: Yes Specify Access to Suicidal Means:  (bottle of Tylenol ) What has been your use of drugs/alcohol within the last 12 months?:  (patient denies alcohol and drug use) Previous Attempts/Gestures: No (thoughts only) How many times?:  (0) Other Self Harm Risks:  (None Reported ) Intentional Self Injurious Behavior: None Family Suicide History: No Recent stressful life event(s): Other (Comment) Persecutory voices/beliefs?: No Depression: Yes Substance abuse history and/or treatment for substance abuse?: Yes (etoh) Suicide prevention information given to non-admitted patients: Not applicable  Risk to Others Homicidal Ideation: No Thoughts of Harm to Others: No Current Homicidal Intent: No Current Homicidal Plan: No Access to  Homicidal Means: No Identified Victim:  (n/a) History of harm to others?: No Assessment of Violence: None Noted Violent Behavior Description:  (patient calm and cooperative ) Does patient have access to weapons?: No Criminal Charges Pending?: No Does patient have a court date: No  Psychosis Hallucinations: None noted Delusions: None noted  Mental Status Report Appear/Hygiene: Disheveled Eye Contact: Good Motor Activity: Unremarkable Speech: Logical/coherent Level of Consciousness: Alert Mood: Depressed Affect: Appropriate to circumstance Anxiety Level: None Thought Processes: Coherent;Relevant Judgement: Unimpaired Orientation: Person;Place;Time;Situation Obsessive Compulsive Thoughts/Behaviors: None  Cognitive Functioning Concentration: Decreased Memory: Recent Intact;Remote Intact IQ: Average Insight: Fair Impulse Control: Good Appetite: Fair Weight Loss:  (none reported ) Weight Gain:  (none reported ) Sleep: Decreased Total Hours of Sleep:  (varies ) Vegetative Symptoms: None  ADLScreening [facility] Assessment Services) Patient's cognitive ability adequate to safely complete daily activities?: Yes Patient able to express need for assistance with ADLs?: Yes Independently performs ADLs?: No  Prior Inpatient Therapy Prior Inpatient Therapy: No Prior Therapy Dates:  (n/a) Prior Therapy  Facilty/Provider(s):  (n/a) Reason for Treatment:  (n/a)  Prior Outpatient Therapy Prior Outpatient Therapy: No Prior Therapy Dates:  (n/a) Prior Therapy Facilty/Provider(s):  (n/a) Reason for Treatment:  (n/a)  ADL Screening (condition at time of admission) Patient's cognitive ability adequate to safely complete daily activities?: Yes Is the patient deaf or have difficulty hearing?: No Does the patient have difficulty seeing, even when wearing glasses/contacts?: No Does the patient have difficulty concentrating, remembering, or making decisions?: No Patient able to express need  for assistance with ADLs?: Yes Does the patient have difficulty dressing or bathing?: No Independently performs ADLs?: No Communication: Independent Dressing (OT): Independent Grooming: Independent Feeding: Independent Bathing: Independent Toileting: Independent In/Out Bed: Independent Walks in Home: Independent Does the patient have difficulty walking or climbing stairs?: No Weakness of Legs: None Weakness of Arms/Hands: None  Home Assistive Devices/Equipment Home Assistive Devices/Equipment: None    Abuse/Neglect Assessment (Assessment to be complete while patient is alone) Physical Abuse: Denies Verbal Abuse: Denies Sexual Abuse: Denies Exploitation of patient/patient's resources: Denies Self-Neglect: Denies Values / Beliefs Cultural Requests During Hospitalization: None Spiritual Requests During Hospitalization: None   Advance Directives (For Healthcare) Advance Directive: Patient does not have advance directive Nutrition Screen- [place] [facility] Patient's home diet: Regular  Additional Information 1:1 In Past 12 Months?: Yes CIRT Risk: No Elopement Risk: No Does patient have medical clearance?: Yes     Disposition:  Disposition Initial Assessment Completed for this Encounter: Yes Disposition of Patient: Inpatient treatment program Type of inpatient treatment program: Adult (Accepted to [facility] by Earleen Newport, NP to Dr. Sabra Heck [id number])  Marlou Porch Reuben Likes 12/27/2013 5:48 PM

## 2013-12-27 NOTE — ED Notes (Signed)
Pt meal order placed. Family has been given pt belongings (shirt, pj bottoms, black purse with contents) sent home with son in law

## 2013-12-27 NOTE — ED Notes (Signed)
Selena Farrell has arrived to transport. Pt has eaten 100% of her meal

## 2013-12-27 NOTE — ED Notes (Signed)
telepsych machine at bedside  

## 2013-12-27 NOTE — Progress Notes (Signed)
Patient ID: Selena Farrell, female   DOB: 1961/02/25, 53 y.o.   MRN: [id number] Report given to Kathlee Nations, RN.

## 2013-12-27 NOTE — ED Notes (Signed)
Daughter is at bedside, reports she wants pt to be involuntarily admitted for narcotic abuse.  Pt reports she was on tramadol for chronic neck pain and ativan for anxiety.  Pt reports she had not taken any x 3 weeks, but admits to drinking 2 beers x 1 week ago.

## 2013-12-27 NOTE — ED Notes (Signed)
[facility] called to transport pt

## 2013-12-27 NOTE — ED Notes (Signed)
Called pharmacy to check on pt reglan dose. Spoke to [employer]. Pt also medicated for back pain and abdominal cramping

## 2013-12-27 NOTE — ED Notes (Signed)
Pt told that she would be moving to our [employer] unit.  Pt denies any suicidal ideations at present but reports x 3 days ago, she "looked at all my medicine and thought about taking it all"  Pt also reports L calf has been hurting, describes as a cramping feeling, is nervous about pain due to husband dying of multiple PEs and DVTs x 7 years ago.

## 2013-12-27 NOTE — Tx Team (Signed)
Initial Interdisciplinary Treatment Plan  PATIENT STRENGTHS: (choose at least two) Ability for insight Average or above average intelligence Capable of independent living Communication skills Motivation for treatment/growth Supportive family/friends Work skills  PATIENT STRESSORS: Health problems Loss of multiple family members Substance abuse   PROBLEM LIST: Problem List/Patient Goals Date to be addressed Date deferred Reason deferred Estimated date of resolution  Depression 12/27/2013     Substance Abuse 12/27/2013     Suicide Risk 12/27/2013                                          DISCHARGE CRITERIA:  Improved stabilization in mood, thinking, and/or behavior Medical problems require only outpatient monitoring Motivation to continue treatment in a less acute level of care Need for constant or close observation no longer present Verbal commitment to aftercare and medication compliance  PRELIMINARY DISCHARGE PLAN: Outpatient therapy Return to previous living arrangement  PATIENT/FAMIILY INVOLVEMENT: This treatment plan has been presented to and reviewed with the patient, Selena Farrell, and/or family member.  The patient and family have been given the opportunity to ask questions and make suggestions.  Selena Farrell 12/27/2013, 8:26 PM

## 2013-12-27 NOTE — Progress Notes (Signed)
Pt did not attend AA group this evening.  

## 2013-12-27 NOTE — ED Provider Notes (Signed)
CSN: [id number]     Arrival date & time 12/27/13  1109 History   First MD Initiated Contact with Patient 12/27/13 1110     No chief complaint on file.    (Consider location/radiation/quality/duration/timing/severity/associated sxs/prior Treatment) HPI 53 year old female with history of recurrent gastritis, gastric ulcers, hypertension, and gastric ulcer who presents complaining of abd pain with n/v/d. Patient reports history of recurrent abdominal pain. Since yesterday she has developed a gradual onset of sharp crampy periumbilical abdominal tenderness, nonradiating, 7/10 with associated nausea and vomiting. States she has vomited 3 separate times of nonbilious nonbloody vomit. She also has 3 episodes of diarrhea since yesterday. First episode was normal blood report having stools this morning, which is new for her. Denies any prior history of having black stools. Does report a history of having gastric ulcers and felt that this pain is similar. She endorsed chills and also endorsed low back pain. She denies having fever, headache, chest pain, shortness of breath, productive cough, hemoptysis, dysuria, hematuria, hematochezia. She denies any recent alcohol use, denies taking any NSAIDs. She denies taking Tylenol for pain with minimal improvement. She has prior surgical history including appendectomy, cholecystectomy, and ERCP with sphincterotomy. Her GI specialist is Dr. Hilarie Fredrickson.     Past Medical History  Diagnosis Date  . Hypertension   . Gastritis 05/2011    EGD-Dr. Hilarie Fredrickson   . Gastric ulcer 05/2011    EGD-Dr. Hilarie Fredrickson   . Depression   . Nicotine dependence   . Alcohol dependence     Quit Sept. 2012   . Helicobacter pylori (H. pylori) infection     Hx of   . External hemorrhoid   . Delayed gastric emptying   . Choledocholithiasis 06/23/2011    ERCP, shincterotomy and baloon stone extraction by Dr Fuller Plan.   . Bronchitis   . Cough 07/29/2013  . Hyponatremia   . Tenosynovitis     right  MC joint   Past Surgical History  Procedure Laterality Date  . Appendectomy    . Cesarean section      x1  . Esophagogastroduodenoscopy N/A 11/28/2012    Procedure: ESOPHAGOGASTRODUODENOSCOPY (EGD);  Surgeon: Jerene Bears, MD;  Location: [facility];  Service: Gastroenterology;  Laterality: N/A;  . Cholecystectomy N/A 11/29/2012    Procedure: LAPAROSCOPIC CHOLECYSTECTOMY WITH INTRAOPERATIVE CHOLANGIOGRAM;  Surgeon: Earnstine Regal, MD;  Location: [facility];  Service: General;  Laterality: N/A;  . Cervical neck fusion  03/2004    C5-C6 anterior discectomy and fusion with allograft  . Ercp w/ sphicterotomy  06/23/2011    ERCP, shincterotomy and baloon stone extraction by Dr Fuller Plan. 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    Family History  Problem Relation Age of Onset  . Colon cancer Neg Hx   . Alcohol abuse Mother   . Stroke Father   . Cancer Maternal Grandfather     prostate  . Aneurysm Sister    History  Substance Use Topics  . Smoking status: Current Every Day Smoker -- 0.50 packs/day for 25 years    Types: Cigarettes  . Smokeless tobacco: Never Used     Comment: Counseling to quit smoking given to patient in exam room   . Alcohol Use: No     Comment: Heavy drinker quit  Sept 2012    OB History   Grav Para Term Preterm Abortions TAB SAB Ect Mult Living                 Review of Systems  All other systems reviewed and are negative.     Allergies  Aspirin; Celebrex; Nsaids; and Vicodin  Home Medications   Prior to Admission medications   Medication Sig Start Date End Date Taking? Authorizing Provider  atenolol (TENORMIN) 25 MG tablet Take 25 mg by mouth daily.    Historical Provider, MD  citalopram (CELEXA) 40 MG tablet Take 40 mg by mouth daily.    Historical Provider, MD  docusate sodium (COLACE) 100 MG capsule Take 100 mg by mouth daily as needed for mild constipation.    Historical Provider, MD  levonorgestrel (MIRENA) 20 MCG/24HR IUD 1 each by  Intrauterine route once. 12/01/10   Historical Provider, MD  LORazepam (ATIVAN) 1 MG tablet Take 1 tablet (1 mg total) by mouth 3 (three) times daily as needed for anxiety. 12/12/13   Jasper Riling. Alvino Chapel, MD  metoCLOPramide (REGLAN) 10 MG tablet Take 1 tablet (10 mg total) by mouth 4 (four) times daily -  before meals and at bedtime. 08/16/13   Willia Craze, NP  nicotine (NICODERM CQ - DOSED IN MG/24 HOURS) 21 mg/24hr patch Place 21 mg onto the skin daily.    Historical Provider, MD  omeprazole (PRILOSEC) 20 MG capsule Take 2 capsules (40 mg total) by mouth daily. 12/12/13   Jasper Riling. Pickering, MD  oxyCODONE-acetaminophen (PERCOCET/ROXICET) 5-325 MG per tablet Take 1-2 tablets by mouth every 6 (six) hours as needed for severe pain. 12/12/13   Jasper Riling. Alvino Chapel, MD  promethazine (PHENERGAN) 25 MG tablet Take 1 tablet (25 mg total) by mouth every 6 (six) hours as needed for nausea or vomiting. 12/12/13   Babette Relic, MD  temazepam (RESTORIL) 30 MG capsule Take 30 mg by mouth at bedtime as needed for sleep.    Historical Provider, MD  traMADol (ULTRAM) 50 MG tablet Take 1 tablet (50 mg total) by mouth every 6 (six) hours as needed. 12/05/13   Lorrine Kin, PA-C   There were no vitals taken for this visit. Physical Exam  Nursing note and vitals reviewed. Constitutional: She is oriented to person, place, and time. She appears well-developed and well-nourished. No distress.  HENT:  Head: Atraumatic.  Tongue is dry with dark deposits on tongue, scrapeable.    Eyes: Conjunctivae are normal.  Neck: Neck supple.  Cardiovascular:  Mild tachycardia without murmurs, rubs or gallops noted  Pulmonary/Chest: Effort normal and breath sounds normal. No respiratory distress. She exhibits no tenderness.  Abdominal: Soft. There is tenderness (mmild epigastric and periumbilical tenderness without guarding or rebound tenderness. Abdomen is soft with normal bowel sounds. No hernia noted.).  Genitourinary:   Chaperone present  Patient has significant external hemorrhoid noted, with melena stool in rectal vault. No obvious mass.  Musculoskeletal: She exhibits no edema.  Neurological: She is alert and oriented to person, place, and time.  Skin: No rash noted.  Psychiatric: She has a normal mood and affect.    ED Course  Procedures (including critical care time)  11:34 AM Pt with abd pain along with n/v/d and melena.  Suspicious for PUD, which she has a hx of.  Work up initiated.  WIll give protonix/pepcid IV along with pain medication and antinausea medication.  Pt made NPO.    12:25 PM Pt with hx of narcotic abuse, she report she is trying to wean herself off from narcotic pain medication and think her sxs may be 2/2 to it.  Pt and family requesting help for her addiction.  She lives by herself  and afraid she may relapse.  She currently denies SI/HI/hallucination.  Currently report Nausea has improved but continues to endorse abd pain.    2:34 PM Pt's sxs has improved. Family members concern that pt may go home and relapse of her narcotic use and harm herself.  They prefers pt to be committed.  Since pt currently not SI/HI, i do not think she will qualify for inpt detox.  However after discussing with Dr. Alvino Chapel,  I will consult TTS to see if pt qualifies for inpt treatment for her narcotic addiction.  Psych hold and Clonidine protocol initiated.    4:37 PM Pt has been evaluated by TTS and have been accepted by [facility].  Can be discharge at 7pm  6:22 PM Pt has been accepted to [facility]  Will be transferred via [place].  Accepting provider are Virginia Rochester, NP and Dr. Sabra Heck.  Pt to Rm 301, Bldg 1.   Labs Review Labs Reviewed  CBC WITH DIFFERENTIAL - Abnormal; Notable for the following:    WBC 17.4 (*)    Hemoglobin 15.4 (*)    MCHC 36.2 (*)    Platelets 516 (*)    Neutro Abs 11.0 (*)    Lymphs Abs 4.9 (*)    Monocytes Absolute 1.4 (*)    All other components within normal limits   COMPREHENSIVE METABOLIC PANEL - Abnormal; Notable for the following:    Sodium 133 (*)    Potassium 3.4 (*)    Chloride 92 (*)    Glucose, Bld 104 (*)    All other components within normal limits  URINALYSIS, ROUTINE W REFLEX MICROSCOPIC - Abnormal; Notable for the following:    Ketones, ur 15 (*)    All other components within normal limits  LIPASE, BLOOD  URINE RAPID DRUG SCREEN (HOSP PERFORMED)  POC OCCULT BLOOD, ED  I-STAT CG4 LACTIC ACID, ED    Imaging Review No results found.   EKG Interpretation None      MDM   Final diagnoses:  Chronic abdominal pain  Nausea vomiting and diarrhea  Narcotic abuse    BP 140/66  Pulse 73  Temp(Src) 98.6 F (37 C) (Oral)  Resp 18  Ht 5\' 4"  (1.626 m)  Wt 146 lb (66.225 kg)  BMI 25.05 kg/m2  SpO2 100%  I have reviewed nursing notes and vital signs. I personally reviewed the imaging tests through PACS system  I reviewed available ER/hospitalization records thought the EMR     Domenic Moras, [place] 12/27/13 1823

## 2013-12-27 NOTE — ED Notes (Signed)
Pt given paper scrubs to change into 

## 2013-12-27 NOTE — BH Assessment (Signed)
Writer has attempted to reach patient's nurse several times in order to complete this patient's TA but unable to connect to her phone. Writer has also contacted secretary-Tracy 2-3x's and asked that this writer is connected or machine is placed in room. Writer has also tried to dial in using the tele assessment machine but no answer. Writer has left a message with the secretary asking that once the machine is set up in patient's room to please have pt's nurse or staff call [phone].

## 2013-12-27 NOTE — Progress Notes (Signed)
Took over Pt's care, Pt currently in dayroom interacting with roommate.  No complaints voiced at this time.  Denies SI/HI/hallucinations at this time.  A.  Support and encouragement offered, medication given as ordered  R.  Pt remains safe on unit, will continue to monitor.

## 2013-12-27 NOTE — ED Notes (Signed)
Pt presents with onset of 2 day h/o umbilical pain with nausea, vomiting and diarrhea.  Pt reports black stools noted this morning, reports recent hospitalization for gastritis x 2 weeks ago, reports symptoms today are different.

## 2013-12-28 DIAGNOSIS — F1320 Sedative, hypnotic or anxiolytic dependence, uncomplicated: Secondary | ICD-10-CM

## 2013-12-28 MED ORDER — GABAPENTIN 100 MG PO CAPS
100.0000 mg | ORAL_CAPSULE | Freq: Three times a day (TID) | ORAL | Status: DC
  Administered 2013-12-28 – 2013-12-30 (×7): 100 mg via ORAL
  Filled 2013-12-28 (×13): qty 1

## 2013-12-28 MED ORDER — DICYCLOMINE HCL 20 MG PO TABS
20.0000 mg | ORAL_TABLET | Freq: Four times a day (QID) | ORAL | Status: DC | PRN
  Administered 2013-12-28 – 2014-01-01 (×8): 20 mg via ORAL
  Filled 2013-12-28 (×8): qty 1

## 2013-12-28 MED ORDER — ACETAMINOPHEN 500 MG PO TABS
1000.0000 mg | ORAL_TABLET | Freq: Four times a day (QID) | ORAL | Status: DC | PRN
  Administered 2013-12-28 – 2014-01-01 (×8): 1000 mg via ORAL
  Filled 2013-12-28 (×7): qty 2

## 2013-12-28 MED ORDER — CLONIDINE HCL 0.1 MG PO TABS
0.1000 mg | ORAL_TABLET | Freq: Four times a day (QID) | ORAL | Status: AC
  Administered 2013-12-28 – 2013-12-29 (×7): 0.1 mg via ORAL
  Filled 2013-12-28 (×9): qty 1

## 2013-12-28 MED ORDER — CLONIDINE HCL 0.1 MG PO TABS
0.1000 mg | ORAL_TABLET | ORAL | Status: AC
  Administered 2013-12-30 – 2013-12-31 (×4): 0.1 mg via ORAL
  Filled 2013-12-28 (×4): qty 1

## 2013-12-28 MED ORDER — ONDANSETRON 4 MG PO TBDP
4.0000 mg | ORAL_TABLET | Freq: Four times a day (QID) | ORAL | Status: DC | PRN
  Administered 2013-12-28 – 2014-01-01 (×5): 4 mg via ORAL
  Filled 2013-12-28 (×5): qty 1

## 2013-12-28 MED ORDER — LOPERAMIDE HCL 2 MG PO CAPS: 2.0000 mg | ORAL_CAPSULE | ORAL | Status: DC | PRN

## 2013-12-28 MED ORDER — METHOCARBAMOL 500 MG PO TABS
500.0000 mg | ORAL_TABLET | Freq: Three times a day (TID) | ORAL | Status: DC | PRN
  Administered 2013-12-28 – 2014-01-01 (×4): 500 mg via ORAL
  Filled 2013-12-28 (×4): qty 1

## 2013-12-28 MED ORDER — METOCLOPRAMIDE HCL 10 MG PO TABS
5.0000 mg | ORAL_TABLET | Freq: Three times a day (TID) | ORAL | Status: DC
  Administered 2013-12-28 – 2014-01-02 (×15): 5 mg via ORAL
  Filled 2013-12-28 (×14): qty 1
  Filled 2013-12-28: qty 35
  Filled 2013-12-28 (×4): qty 1
  Filled 2013-12-28 (×2): qty 35
  Filled 2013-12-28: qty 1

## 2013-12-28 MED ORDER — CLONIDINE HCL 0.1 MG PO TABS
0.1000 mg | ORAL_TABLET | Freq: Every day | ORAL | Status: AC
  Administered 2014-01-01 – 2014-01-02 (×2): 0.1 mg via ORAL
  Filled 2013-12-28 (×2): qty 1

## 2013-12-28 MED ORDER — ACETAMINOPHEN 325 MG PO TABS
650.0000 mg | ORAL_TABLET | Freq: Four times a day (QID) | ORAL | Status: DC | PRN
  Administered 2013-12-28: 650 mg via ORAL
  Filled 2013-12-28: qty 2

## 2013-12-28 MED ORDER — HYDROXYZINE HCL 25 MG PO TABS
25.0000 mg | ORAL_TABLET | Freq: Four times a day (QID) | ORAL | Status: DC | PRN
  Administered 2013-12-28 – 2013-12-31 (×5): 25 mg via ORAL
  Filled 2013-12-28 (×5): qty 1

## 2013-12-28 NOTE — Progress Notes (Signed)
[facility] Notes:  (Nursing/MHT/Case Management/Adjunct)  Date:  12/28/2013  Time:  5:22 PM  Type of Therapy:  Psychoeducational Skills  Participation Level:  Did Not Attend  Summary of Progress/Problems: Pt was lying in bed at time of group.  Clint Bolder 12/28/2013, 5:22 PM

## 2013-12-28 NOTE — H&P (Signed)
NP assessment was reviewed, and I concur with above assessment and plan.  Skip Estimable, MD

## 2013-12-28 NOTE — Progress Notes (Signed)
D.  Pt pleasant but anxious on approach, states she has had a rough day.  Denies SI/HI/hallucinations at this time.  Positive for evening AA group, interacting appropriately within milieu.  A.  Support and encouragement offered, medications given as ordered  R.  Pt remains safe on unit, will continue to monitor.

## 2013-12-28 NOTE — [facility] Group Notes (Signed)
[facility] Notes:  (Nursing/MHT/Case Management/Adjunct)  Date:  12/28/2013  Time:  11:08 AM  Type of Therapy:  Psychoeducational Skills  Participation Level:  Active  Participation Quality:  Appropriate  Affect:  Appropriate  Cognitive:  Appropriate  Insight:  Appropriate  Engagement in Group:  Engaged  Modes of Intervention:  Discussion  Summary of Progress/Problems: Pt did attend self inventory group, pt reported that she was negative SI/HI, no AH/VH noted. Pt rated her depression as a 9, and his helplessness/hopelessness as a 9.     Longino Trefz Shanta Teyah Rossy 12/28/2013, 11:08 AM

## 2013-12-28 NOTE — Progress Notes (Signed)
D: pt has been c/o HA,N/V, stomach cramping/spasms. Pt stated she wants to take her citalopram in the morning before breakfast. Pt wanting bentyl for cramps and asking for tylenol periodically. denies si/hi/avh. Rates pain 9/10. Pt has a depressed mood, sad/flat affect. cooperative on unit A: NP made aware, orders changed. Support and encouragement given. q 15 min safety checks R: pt reports n/v decrease. Pain decrease now 5/10. Pt remains safe on unit.no further complaints or signs of distress noted

## 2013-12-28 NOTE — Progress Notes (Signed)
Patient did attend the evening speaker AA meeting.  

## 2013-12-28 NOTE — [facility] Suicide Risk Assessment (Signed)
   Nursing information obtained from:  Patient Demographic factors:  Caucasian;Living alone Current Mental Status:  NA Loss Factors:  Loss of significant relationship;Decline in physical health Historical Factors:  NA Risk Reduction Factors:  Sense of responsibility to family;Positive therapeutic relationship Total Time spent with patient: 1 hour  CLINICAL FACTORS:   depression  Psychiatric Specialty Exam: Physical Exam  ROS  Blood pressure 127/76, pulse 62, temperature 97.7 F (36.5 C), temperature source Oral, resp. rate 16, height 5\' 4"  (1.626 m), weight 63.05 kg (139 lb).Body mass index is 23.85 kg/(m^2).  General Appearance: Casual  Eye Contact::  Fair  Speech:  Normal Rate  Volume:  Normal  Mood:  Irritable  Affect:  Appropriate  Thought Process:  Goal Directed  Orientation:  Negative  Thought Content:  Negative  Suicidal Thoughts:  No  Homicidal Thoughts:  No  Memory:  Negative  Judgement:  Poor  Insight:  Lacking  Psychomotor Activity:  Normal  Concentration:  Fair  Recall:  [place] of Knowledge:Fair  Language: Fair  Akathisia:  Negative  Handed:  Right  AIMS (if indicated):     Assets:  Warehouse manager Resources/Insurance  Sleep:  Number of Hours: 6.25   Musculoskeletal: Strength & Muscle Tone: within normal limits Gait & Station: normal Patient leans: Front  COGNITIVE FEATURES THAT CONTRIBUTE TO RISK:  Closed-mindedness Polarized thinking    SUICIDE RISK:   Moderate:  Frequent suicidal ideation with limited intensity, and duration, some specificity in terms of plans, no associated intent, good self-control, limited dysphoria/symptomatology, some risk factors present, and identifiable protective factors, including available and accessible social support.  PLAN OF CARE:  I certify that inpatient services furnished can reasonably be expected to improve the patient's condition.  Selena Farrell P Madalee Altmann 12/28/2013, 2:12 PM

## 2013-12-28 NOTE — [facility] Counselor (Signed)
Adult Comprehensive Assessment  Patient ID: Selena Farrell, female   DOB: 05/12/1961, 53 y.o.   MRN: [id number]  Information Source: Information source: Patient  Current Stressors:  Educational / Learning stressors: N/A Employment / Job issues: Patient reports her place of employment becomes overwhelming since her company has changed to a new system. The change occurred two years ago. Family Relationships: Patient reports that her daughter was not supportive of her taking tramadol. Patient lost husband, brothers, and niece in the past few years.  Financial / Lack of resources (include bankruptcy): Patient has difficulty paying her bills due to limited income. "It's been very difficult"  Housing / Lack of housing: Patient owns her own home. Patient lives by herself Physical health (include injuries & life threatening diseases): Patient reports she has been having physical issues for 4 years now due to over consumption of alcohol and other health issues. Had to have gallbladder removed as well. "I got attached to pain medications" per patient Social relationships: Patient reports that she isolates herself and that she has limited support. Patient reports she has limited support "I don't think they understand" per patient.  Substance abuse: Patient endorses consumption of excessive alcohol to cope with depression. Patient reports she currently has insomnia as well.  Bereavement / Loss: Husband, brothers, and niece passed away. Patient reports "Husband died 7 years ago and it's still very rough".  Living/Environment/Situation:  Living Arrangements: Alone Living conditions (as described by patient or guardian): Patient resides in her own home How long has patient lived in current situation?: Patient has lived in her own home for 20 years  What is atmosphere in current home: Comfortable  Family History:  Marital status: Widowed Widowed, when?: 7 years ago  Does patient have children?: Yes How  many children?: 1 How is patient's relationship with their children?: Patient reports "she has a weird sense of humor" and reports her to be supportive of patient; however patient reports their relationship to be strained due to "clashing"  Childhood History:  By whom was/is the patient raised?: Both parents Description of patient's relationship with caregiver when they were a child: Patient reports good relationship with parents. Father passed away when patient was 43 years old at home due to alcoholism.  Patient's description of current relationship with people who raised him/her: Parents are deceased.  Did patient suffer any verbal/emotional/physical/sexual abuse as a child?: No Did patient suffer from severe childhood neglect?: No Has patient ever been sexually abused/assaulted/raped as an adolescent or adult?: No Was the patient ever a victim of a crime or a disaster?: No Witnessed domestic violence?: No Has patient been effected by domestic violence as an adult?: No  Education:  Highest grade of school patient has completed: Received GED Currently a student?: No Learning disability?: No  Employment/Work Situation:   Employment situation: Employed Where is patient currently employed?: Designer, jewellery How long has patient been employed?: 20 years  Patient's job has been impacted by current illness: Yes What is the longest time patient has a held a job?: Patient reports 20 years Where was the patient employed at that time?: Current place of employment  Has patient ever been in the [employer]?: No Has patient ever served in Recruitment consultant?: No  Financial Resources:   Financial resources: Income from employment Does patient have a representative payee or guardian?: No  Alcohol/Substance Abuse:   What has been your use of drugs/alcohol within the last 12 months?: Tramadol and Hydrocodone If attempted suicide, did drugs/alcohol play  a role in this?: No Alcohol/Substance Abuse  Treatment Hx: Denies past history If yes, describe treatment: N/A Has alcohol/substance abuse ever caused legal problems?: No  Social Support System:   Patient's Community Support System: Poor Describe Community Support System: Patient reports limited support. Patient only identifies her daughter to be a support which is limited Type of faith/religion: Baptist How does patient's faith help to cope with current illness?: Patient reports "I'm trying to get my faith back. I need it to get back on track". Patient reports God will help her get through this.  Leisure/Recreation:   Leisure and Hobbies: Patient reports that she has not physically felt like doing anything. She reports that she enjoys mowing the yard and use to bowl in the past.   Strengths/Needs:   What things does the patient do well?: Patient reports no identificaiton with current strengths. Patient reported that she was good at her job in the past. In what areas does patient struggle / problems for patient: Patient reports current struggles with communicating her feelings with others.   Discharge Plan:   Does patient have access to transportation?: Yes Will patient be returning to same living situation after discharge?: Yes Currently receiving community mental health services: No If no, would patient like referral for services when discharged?: Yes (What [place] ) Does patient have financial barriers related to discharge medications?: Yes Patient description of barriers related to discharge medications: Patient reports she does not have enough income to afford medications. "I barely have enough to make ends meet".  Summary/Recommendations:   Summary and Recommendations (to be completed by the evaluator): Patient is a 53 year old Caucasian female who presents with the chief complaint of excessive substance abuse and desire for substance abuse treatment. Patient endorses continually grievance over her deceased husband  and other members in her family that have passed away as well. Patient reports that she is currently employed yet has difficulty maintaining her basic needs due to limited income. Patient desires to alleviate her substance abuse and regain "my control in life".  Harriet Masson.. 12/28/2013

## 2013-12-28 NOTE — ED Provider Notes (Signed)
Medical screening examination/treatment/procedure(s) were performed by non-physician practitioner and as supervising physician I was immediately available for consultation/collaboration.   EKG Interpretation None       Jasper Riling. Alvino Chapel, MD 12/28/13 2012

## 2013-12-28 NOTE — [facility] Group Notes (Signed)
[facility] Group Therapy  12/28/2013 11:04 AM  Type of Therapy:  Group Therapy  Participation Level:  Active  Participation Quality:  Attentive and Sharing  Affect:  Depressed  Cognitive:  Alert and Oriented  Insight:  Improving  Engagement in Therapy:  Developing/Improving  Modes of Intervention:  Discussion, Exploration, Problem-solving, Rapport Building, Socialization and Support  Summary of Progress/Problems: The main focus of today's process group was to identify the patient's current support system and decide on other supports that can be put in place. An emphasis was placed on using counselor, doctor, therapy groups, 12-step groups, and problem-specific support groups to expand supports, as well as doing something different than has been done before.   Selena Farrell was observed to be active within group as she discussed negative and positive supports within her life. She reflected upon her familial relationships as she identified her sister to be a negative support due to her sister's consistent negativity. Selena Farrell verbalized the effects of being around negative people as she reported she often sometimes becomes more depressed and less motivated to change her life and stop her substance abuse. Selena Farrell demonstrated progressing insight as she reported her desire to utilize positive peer support upon her discharge to ensure that she continues towards her sobriety and remains motivated overall. Patient's mood and affect continues to vacillate as it appeared brightened in group amongst peers yet decompensated afterwards.    Harriet Masson. 12/28/2013, 11:04 AM

## 2013-12-28 NOTE — H&P (Signed)
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 My daughter had called them to come check on me. I have been sick to my stomach. I have a lot of pain. It's been going on x 3 weeks. I have been on pain/anxiety medicines for a long time. Then on February 10th this year, I had neck surgery. Then I started to double up on my pain/anxiety medicines. When I tried to cut down on how much of these medicines that I was taking, I got very sick. It was horrible. I felt so bad, I thought about dying. I would like to go to a tx center after my discharge from here".  Elements:  Location:  Opioid/benzodiazepine dependence. Quality:  Racing thougths, Muscle carmps, Insomnia. Severity:  Severe. Timing:  Started 3 weeks ago. Duration:  Chronic. Context:  Bad stomach pains, neck surgery, high anxiety, started to doubling my pain and anxiety medicines.  Associated Signs/Synptoms:  Depression Symptoms:  depressed mood, feelings of worthlessness/guilt, anxiety, panic attacks, insomnia, loss of energy/fatigue,  (Hypo) Manic Symptoms:  Irritable Mood,  Anxiety Symptoms:  Excessive Worry,  Psychotic Symptoms:  Hallucinations: None  PTSD Symptoms: NA  Total Time spent with patient: 1 hour  Psychiatric Specialty Exam: Physical Exam  Constitutional: She is oriented to person, place, and time. She appears well-developed.  HENT:  Head: Normocephalic.  Eyes: Pupils are equal, round, and reactive to light.  Neck: Normal range of motion.  Cardiovascular: Normal rate.   Respiratory: Effort normal.  GI: Soft.  Genitourinary:  Denies any issues in this areas  Musculoskeletal: Normal range of motion.  Neurological: She is alert and oriented  to person, place, and time.  Skin: Skin is warm and dry.  Psychiatric: Her speech is normal. Her mood appears anxious. She is actively hallucinating. Cognition and memory are normal. She expresses impulsivity. She exhibits a depressed mood. She expresses suicidal ideation.    Review of Systems  Constitutional: Positive for chills, malaise/fatigue and diaphoresis.  HENT: Negative.   Eyes: Negative.   Respiratory: Negative.   Cardiovascular: Negative.   Gastrointestinal: Positive for nausea.  Genitourinary: Negative.   Musculoskeletal: Positive for myalgias.  Skin: Negative.   Neurological: Positive for dizziness.  Endo/Heme/Allergies: Negative.   Psychiatric/Behavioral: Positive for depression, suicidal ideas, hallucinations and substance abuse. Negative for memory loss. The patient is nervous/anxious and has insomnia.     Blood pressure 109/74, pulse 73, temperature 97.7 F (36.5 C), temperature source Oral, resp. rate 16, height _0  (1.626 m), weight 63.05 kg (139 lb).Body mass index is 23.85 kg/(m^2).  General Appearance: Disheveled  Eye Sport and exercise psychologist::  Fair  Speech:  Clear and Coherent  Volume:  Normal  Mood:  Anxious and Depressed  Affect:  Flat and Tearful  Thought Process:  Coherent and Goal Directed  Orientation:  Full (Time, Place, and Person)  Thought Content:  Rumination  Suicidal Thoughts:  No  Homicidal Thoughts:  No  Memory:  Immediate;   Fair Recent;   Fair Remote;   Fair  Judgement:  Fair  Insight:  Fair  Psychomotor Activity:  Restlessness  Concentration:  Fair  Recall:  [employer] of Knowledge:Fair  Language: Good  Akathisia:  No  Handed:  Right  AIMS (if indicated):  Assets:  Desire for Improvement  Sleep:  Number of Hours: 6.25    Musculoskeletal: Strength & Muscle Tone: within normal limits Gait & Station: normal Patient leans: N/A  Past Psychiatric History: Diagnosis:  Opioid Disorder - Severe (304.00), Benzodiazepine dependence   Hospitalizations: [facility] adult unit  Outpatient Care: None reported  Substance Abuse Care: None reported  Self-Mutilation:Denies  Suicidal Attempts: Denies  Violent Behaviors: NA   Past Medical History:   Past Medical History  Diagnosis Date  . Hypertension   . Gastritis 05/2011    EGD-Dr. Hilarie Fredrickson   . Gastric ulcer 05/2011    EGD-Dr. Hilarie Fredrickson   . Depression   . Nicotine dependence   . Alcohol dependence     Quit Sept. 2012   . Helicobacter pylori (H. pylori) infection     Hx of   . External hemorrhoid   . Delayed gastric emptying   . Choledocholithiasis 06/23/2011    ERCP, shincterotomy and baloon stone extraction by Dr Fuller Plan.   . Bronchitis   . Cough 07/29/2013  . Hyponatremia   . Tenosynovitis     right MC joint   Cardiac History:  HTN  Allergies:   Allergies  Allergen Reactions  . Aspirin Other (See Comments)    Pt has bleeding ulcer, not allowed to take aspirin  . Celebrex [Celecoxib] Nausea Only  . Nsaids Nausea Only  . Vicodin [Hydrocodone-Acetaminophen] Nausea And Vomiting    Can take with antiemetics.   PTA Medications: Prescriptions prior to admission  Medication Sig Dispense Refill  . atenolol (TENORMIN) 25 MG tablet Take 25 mg by mouth daily.      . citalopram (CELEXA) 40 MG tablet Take 40 mg by mouth daily.      Marland Kitchen docusate sodium (COLACE) 100 MG capsule Take 100 mg by mouth daily as needed for mild constipation.      Marland Kitchen levonorgestrel (MIRENA) 20 MCG/24HR IUD 1 each by Intrauterine route once.      Marland Kitchen LORazepam (ATIVAN) 1 MG tablet Take 1 tablet (1 mg total) by mouth 3 (three) times daily as needed for anxiety.  15 tablet  0  . nicotine (NICODERM CQ - DOSED IN MG/24 HOURS) 21 mg/24hr patch Place 21 mg onto the skin daily.      Marland Kitchen omeprazole (PRILOSEC) 20 MG capsule Take 2 capsules (40 mg total) by mouth daily.  60 capsule  0  . promethazine (PHENERGAN) 25 MG tablet Take 1 tablet (25 mg total) by mouth every 6 (six) hours as needed for nausea or vomiting.  15  tablet  0  . temazepam (RESTORIL) 30 MG capsule Take 30 mg by mouth at bedtime as needed for sleep.      . traMADol (ULTRAM) 50 MG tablet Take 1 tablet (50 mg total) by mouth every 6 (six) hours as needed.  10 tablet  0    Previous Psychotropic Medications:  Medication/Dose  See medication lists above               Substance Abuse History in the last 12 months:  yes  Consequences of Substance Abuse: Medical Consequences:  Liver damage, Possible death by overdose Legal Consequences:  Arrests, jail time, Loss of driving privilege. Family Consequences:  Family discord, divorce and or separation.  Social History:  reports that she has been smoking Cigarettes.  She has a 12.5 pack-year smoking history. She has never used smokeless tobacco. She reports that she does not drink alcohol or use illicit drugs. Additional Social History: History  of alcohol / drug use?: Yes Longest period of sobriety (when/how long): no longer drinks for several months Withdrawal Symptoms: Other (Comment) (none) Current Place of Residence: [place], [place] of Birth: [place], [place]   Family Members: "My daughter"  Marital Status:  Widowed  Children: 1  Sons: 0  Daughters: 1  Relationships:Widowed  Education:  GED  Educational Problems/Performance: Completed GED  Religious Beliefs/Practices: NA  History of Abuse (Emotional/Phsycial/Sexual): Denies  Occupational Experiences: Medical laboratory scientific officer History:  None.  Legal History: None reported  Hobbies/Interests: NA  Family History:   Family History  Problem Relation Age of Onset  . Colon cancer Neg Hx   . Alcohol abuse Mother   . Stroke Father   . Cancer Maternal Grandfather     prostate  . Aneurysm Sister     Results for orders placed during the hospital encounter of 12/27/13 (from the past 72 hour(s))  CBC WITH DIFFERENTIAL     Status: Abnormal   Collection Time    12/27/13 11:20 AM      Result Value Ref Range    WBC 17.4 (*) 4.0 - 10.5 K/uL   RBC 4.95  3.87 - 5.11 MIL/uL   Hemoglobin 15.4 (*) 12.0 - 15.0 g/dL   HCT 42.5  36.0 - 46.0 %   MCV 85.9  78.0 - 100.0 fL   MCH 31.1  26.0 - 34.0 pg   MCHC 36.2 (*) 30.0 - 36.0 g/dL   RDW 14.3  11.5 - 15.5 %   Platelets 516 (*) 150 - 400 K/uL   Neutrophils Relative % 64  43 - 77 %   Neutro Abs 11.0 (*) 1.7 - 7.7 K/uL   Lymphocytes Relative 28  12 - 46 %   Lymphs Abs 4.9 (*) 0.7 - 4.0 K/uL   Monocytes Relative 8  3 - 12 %   Monocytes Absolute 1.4 (*) 0.1 - 1.0 K/uL   Eosinophils Relative 0  0 - 5 %   Eosinophils Absolute 0.1  0.0 - 0.7 K/uL   Basophils Relative 0  0 - 1 %   Basophils Absolute 0.0  0.0 - 0.1 K/uL  COMPREHENSIVE METABOLIC PANEL     Status: Abnormal   Collection Time    12/27/13 11:20 AM      Result Value Ref Range   Sodium 133 (*) 137 - 147 mEq/L   Potassium 3.4 (*) 3.7 - 5.3 mEq/L   Chloride 92 (*) 96 - 112 mEq/L   CO2 24  19 - 32 mEq/L   Glucose, Bld 104 (*) 70 - 99 mg/dL   BUN 6  6 - 23 mg/dL   Creatinine, Ser 0.69  0.50 - 1.10 mg/dL   Calcium 8.9  8.4 - 10.5 mg/dL   Total Protein 6.9  6.0 - 8.3 g/dL   Albumin 4.0  3.5 - 5.2 g/dL   AST 18  0 - 37 U/L   ALT 14  0 - 35 U/L   Alkaline Phosphatase 86  39 - 117 U/L   Total Bilirubin 0.4  0.3 - 1.2 mg/dL   GFR calc non Af Amer >90  >90 mL/min   GFR calc Af Amer >90  >90 mL/min   Comment: (NOTE)     The eGFR has been calculated using the CKD EPI equation.     This calculation has not been validated in all clinical situations.     eGFR's persistently <90 mL/min signify possible Chronic Kidney  Disease.  LIPASE, BLOOD     Status: None   Collection Time    12/27/13 11:20 AM      Result Value Ref Range   Lipase 30  11 - 59 U/L  POC OCCULT BLOOD, ED     Status: None   Collection Time    12/27/13 11:33 AM      Result Value Ref Range   Fecal Occult Bld NEGATIVE  NEGATIVE  URINALYSIS, ROUTINE W REFLEX MICROSCOPIC     Status: Abnormal   Collection Time    12/27/13  2:33 PM       Result Value Ref Range   Color, Urine YELLOW  YELLOW   APPearance CLEAR  CLEAR   Specific Gravity, Urine 1.011  1.005 - 1.030   pH 7.0  5.0 - 8.0   Glucose, UA NEGATIVE  NEGATIVE mg/dL   Hgb urine dipstick NEGATIVE  NEGATIVE   Bilirubin Urine NEGATIVE  NEGATIVE   Ketones, ur 15 (*) NEGATIVE mg/dL   Protein, ur NEGATIVE  NEGATIVE mg/dL   Urobilinogen, UA 0.2  0.0 - 1.0 mg/dL   Nitrite NEGATIVE  NEGATIVE   Leukocytes, UA NEGATIVE  NEGATIVE   Comment: MICROSCOPIC NOT DONE ON URINES WITH NEGATIVE PROTEIN, BLOOD, LEUKOCYTES, NITRITE, OR GLUCOSE <1000 mg/dL.   Psychological Evaluations:  Assessment:   DSM5: Schizophrenia Disorders:  NA Obsessive-Compulsive Disorders:  NA Trauma-Stressor Disorders:  NA Substance/Addictive Disorders:  Opioid Disorder - Severe (304.00), Benzodiazepine dependence Depressive Disorders:  Major Depressive Disorder - Moderate (296.22)  AXIS I:   Opioid Disorder - Severe (304.00), Benzodiazepine dependence, Major depressive disorder AXIS II:  Deferred AXIS III:   Past Medical History  Diagnosis Date  . Hypertension   . Gastritis 05/2011    EGD-Dr. Hilarie Fredrickson   . Gastric ulcer 05/2011    EGD-Dr. Hilarie Fredrickson   . Depression   . Nicotine dependence   . Alcohol dependence     Quit Sept. 2012   . Helicobacter pylori (H. pylori) infection     Hx of   . External hemorrhoid   . Delayed gastric emptying   . Choledocholithiasis 06/23/2011    ERCP, shincterotomy and baloon stone extraction by Dr Fuller Plan.   . Bronchitis   . Cough 07/29/2013  . Hyponatremia   . Tenosynovitis     right MC joint   AXIS IV:  other psychosocial or environmental problems and Substance dependence AXIS V:  41-50 serious symptoms  Treatment Plan/Recommendations: 1. Admit for crisis management and stabilization, estimated length of stay 3-5 days.  2. Medication management to reduce current symptoms to base line and improve the patient's overall level of functioning  3. Treat health problems  as indicated.  4. Develop treatment plan to decrease risk of relapse upon discharge and the need for readmission.  5. Psycho-social education regarding relapse prevention and self care.  6. Health care follow up as needed for medical problems.  7. Review, reconcile, and reinstate any pertinent home medications for other health issues where appropriate. 8. Call for consults with hospitalist for any additional specialty patient care services as needed.  Treatment Plan Summary: Daily contact with patient to assess and evaluate symptoms and progress in treatment Medication management  Current Medications:  Current Facility-Administered Medications  Medication Dose Route Frequency Provider Last Rate Last Dose  . acetaminophen (TYLENOL) tablet 650 mg  650 mg Oral Q6H PRN Encarnacion Slates, NP      . atenolol (TENORMIN) tablet 25 mg  25 mg Oral Daily Shuvon  Rankin, NP   25 mg at 12/28/13 0826  . citalopram (CELEXA) tablet 40 mg  40 mg Oral Daily Shuvon Rankin, NP   40 mg at 12/28/13 0826  . docusate sodium (COLACE) capsule 100 mg  100 mg Oral Daily PRN Shuvon Rankin, NP      . magnesium hydroxide (MILK OF MAGNESIA) suspension 30 mL  30 mL Oral Daily PRN Shuvon Rankin, NP      . nicotine (NICODERM CQ - dosed in mg/24 hours) patch 21 mg  21 mg Transdermal Daily Shuvon Rankin, NP   21 mg at 12/28/13 0828  . pantoprazole (PROTONIX) EC tablet 40 mg  40 mg Oral Daily Shuvon Rankin, NP   40 mg at 12/28/13 0826  . temazepam (RESTORIL) capsule 30 mg  30 mg Oral QHS PRN Shuvon Rankin, NP   30 mg at 12/27/13 2131  . traMADol (ULTRAM) tablet 50 mg  50 mg Oral Q6H PRN Shuvon Rankin, NP        Observation Level/Precautions:  15 minute checks  Laboratory:  Per ED  Psychotherapy:  Group sessions, AA/NA meetings  Medications:  See medication lists  Consultations:  As needed  Discharge Concerns:  Sobriety, Safety  Estimated LOS: 2-4 days  Other:  NA   I certify that inpatient services furnished can reasonably  be expected to improve the patient's condition.   Encarnacion Slates, [place], FNP 4/18/20159:20 AM

## 2013-12-29 DIAGNOSIS — F329 Major depressive disorder, single episode, unspecified: Secondary | ICD-10-CM

## 2013-12-29 DIAGNOSIS — F1120 Opioid dependence, uncomplicated: Secondary | ICD-10-CM

## 2013-12-29 MED ORDER — ENSURE COMPLETE PO LIQD
237.0000 mL | Freq: Two times a day (BID) | ORAL | Status: DC
  Administered 2013-12-30 – 2014-01-01 (×6): 237 mL via ORAL

## 2013-12-29 NOTE — Progress Notes (Signed)
D: pt stated she is doing better this evening. Pt still c/o of HA rates it 7/10. Pt has an anxious expression. Pt also c/o constipation and gas. Denies si/hi/avh. Pt appropriate on unit A: 1000mg  of tylenol given. Support and encouragement offered. q 15 min safety checks R: pt remains safe on unit. No further signs of distress or complaints at this time

## 2013-12-29 NOTE — [facility] Group Notes (Signed)
[facility] LCSW Group Therapy Note  12/29/2013 /10:00 AM  Type of Therapy and Topic:  Group Therapy: Avoiding Self-Sabotaging and Enabling Behaviors  Participation Level:  Did Not Attend    Sheilah Pigeon, LCSW

## 2013-12-29 NOTE — Progress Notes (Signed)
Pt did not attend group this afternoon.   

## 2013-12-29 NOTE — [facility] Group Notes (Signed)
[facility] Group Notes:  (Nursing/MHT/Case Management/Adjunct)  Date:  12/29/2013  Time:  11:54 AM  Type of Therapy:  Psychoeducational Skills  Participation Level:  Did Not Attend  Selena Farrell Selena Farrell 12/29/2013, 11:54 AM

## 2013-12-29 NOTE — Progress Notes (Signed)
[facility] MD Progress Note  12/29/2013 1:45 PM Selena Farrell  MRN:  [id number]  Subjective: Selena Farrell reports, "I have headaches. I feel this tingling sensation to my fingers, sick on my stomach. I just don't feel good this morning"  O: Patient is visible on the unit and is attending the scheduled groups. She is complaint with her medications. She says she is determined to get better and not depend pills to feel better. Patient's medications will be adjusted to address her psychiatric symptoms. The patient does appear  depressed during this assessment. From her reports, she is currently dealing with the withdrawal symptoms of opiates.  Diagnosis:   DSM5: Schizophrenia Disorders:  NA Obsessive-Compulsive Disorders:  NA Trauma-Stressor Disorders:  NA Substance/Addictive Disorders:  Opioid Disorder - Severe (304.00) Depressive Disorders:  Major Depressive Disorder - Moderate (296.22) Total Time spent with patient: Greater than 30 minutes  Axis I: Opioid dependence, major depressive disorder Axis II: Deferred Axis III:  Past Medical History  Diagnosis Date  . Hypertension   . Gastritis 05/2011    EGD-Dr. Hilarie Fredrickson   . Gastric ulcer 05/2011    EGD-Dr. Hilarie Fredrickson   . Depression   . Nicotine dependence   . Alcohol dependence     Quit Sept. 2012   . Helicobacter pylori (H. pylori) infection     Hx of   . External hemorrhoid   . Delayed gastric emptying   . Choledocholithiasis 06/23/2011    ERCP, shincterotomy and baloon stone extraction by Dr Fuller Plan.   . Bronchitis   . Cough 07/29/2013  . Hyponatremia   . Tenosynovitis     right MC joint   Axis IV: other psychosocial or environmental problems and Substance dependence Axis V: 41-50 serious symptoms  ADL's:  Intact  Sleep: Fair  Appetite:  Fair  Suicidal Ideation:  Plan:  Denies Intent:  Denies Means:  Denies Homicidal Ideation:  Plan:  Denies Intent:  Denies Means:  Denies AEB (as evidenced by):  Psychiatric Specialty  Exam: Physical Exam  Psychiatric: Her speech is normal. Judgment and thought content normal. Her mood appears anxious. She is agitated. Cognition and memory are normal. She exhibits a depressed mood.    Review of Systems  Constitutional: Positive for malaise/fatigue.  Eyes: Negative.   Respiratory: Negative.   Cardiovascular: Negative.   Gastrointestinal: Positive for abdominal pain.  Genitourinary: Negative.   Musculoskeletal: Negative.   Skin: Negative.   Neurological: Positive for tingling, weakness and headaches.  Endo/Heme/Allergies: Negative.   Psychiatric/Behavioral: Positive for depression and substance abuse. Negative for suicidal ideas, hallucinations and memory loss. The patient is nervous/anxious.     Blood pressure 111/69, pulse 56, temperature 97.8 F (36.6 C), temperature source Oral, resp. rate 18, height 5\' 4"  (1.626 m), weight 63.05 kg (139 lb), SpO2 99.00%.Body mass index is 23.85 kg/(m^2).   General Appearance: Disheveled   Eye Sport and exercise psychologist:: Fair   Speech: Clear and Coherent   Volume: Normal   Mood: Anxious and Depressed   Affect: Flat and Tearful   Thought Process: Coherent and Goal Directed   Orientation: Full (Time, Place, and Person)   Thought Content: Rumination   Suicidal Thoughts: No   Homicidal Thoughts: No   Memory: Immediate; Fair  Recent; Fair  Remote; Fair   Judgement: Fair   Insight: Fair   Psychomotor Activity: Restlessness   Concentration: Fair   Recall: [employer] of Knowledge:Fair   Language: Good   Akathisia: No   Handed: Right   AIMS (if indicated):  Assets: Desire for Improvement   Sleep: Number of Hours: 6.25    Musculoskeletal: Strength & Muscle Tone: within normal limits Gait & Station: normal Patient leans: N/A  Current Medications: Current Facility-Administered Medications  Medication Dose Route Frequency Provider Last Rate Last Dose  . acetaminophen (TYLENOL) tablet 1,000 mg  1,000 mg Oral Q6H PRN Encarnacion Slates, NP    1,000 mg at 12/29/13 0802  . atenolol (TENORMIN) tablet 25 mg  25 mg Oral Daily Shuvon Rankin, NP   25 mg at 12/29/13 0802  . citalopram (CELEXA) tablet 40 mg  40 mg Oral Daily Shuvon Rankin, NP   40 mg at 12/29/13 0802  . cloNIDine (CATAPRES) tablet 0.1 mg  0.1 mg Oral QID Encarnacion Slates, NP   0.1 mg at 12/29/13 1154   Followed by  . [START ON 12/30/2013] cloNIDine (CATAPRES) tablet 0.1 mg  0.1 mg Oral BH-qamhs Encarnacion Slates, NP       Followed by  . [START ON 01/01/2014] cloNIDine (CATAPRES) tablet 0.1 mg  0.1 mg Oral QAC breakfast Encarnacion Slates, NP      . dicyclomine (BENTYL) tablet 20 mg  20 mg Oral Q6H PRN Encarnacion Slates, NP   20 mg at 12/29/13 0802  . docusate sodium (COLACE) capsule 100 mg  100 mg Oral Daily PRN Shuvon Rankin, NP   100 mg at 12/29/13 0802  . gabapentin (NEURONTIN) capsule 100 mg  100 mg Oral TID Encarnacion Slates, NP   100 mg at 12/29/13 1154  . hydrOXYzine (ATARAX/VISTARIL) tablet 25 mg  25 mg Oral Q6H PRN Encarnacion Slates, NP   25 mg at 12/29/13 0801  . loperamide (IMODIUM) capsule 2-4 mg  2-4 mg Oral PRN Encarnacion Slates, NP      . magnesium hydroxide (MILK OF MAGNESIA) suspension 30 mL  30 mL Oral Daily PRN Shuvon Rankin, NP      . methocarbamol (ROBAXIN) tablet 500 mg  500 mg Oral Q8H PRN Encarnacion Slates, NP   500 mg at 12/28/13 2133  . metoCLOPramide (REGLAN) tablet 5 mg  5 mg Oral TID AC Encarnacion Slates, NP   5 mg at 12/29/13 1154  . nicotine (NICODERM CQ - dosed in mg/24 hours) patch 21 mg  21 mg Transdermal Daily Shuvon Rankin, NP   21 mg at 12/29/13 0611  . ondansetron (ZOFRAN-ODT) disintegrating tablet 4 mg  4 mg Oral Q6H PRN Encarnacion Slates, NP   4 mg at 12/29/13 0801  . pantoprazole (PROTONIX) EC tablet 40 mg  40 mg Oral Daily Shuvon Rankin, NP   40 mg at 12/29/13 0802  . temazepam (RESTORIL) capsule 30 mg  30 mg Oral QHS PRN Shuvon Rankin, NP   30 mg at 12/28/13 2132    Lab Results:  Results for orders placed during the hospital encounter of 12/27/13 (from the past 48 hour(s))   URINALYSIS, ROUTINE W REFLEX MICROSCOPIC     Status: Abnormal   Collection Time    12/27/13  2:33 PM      Result Value Ref Range   Color, Urine YELLOW  YELLOW   APPearance CLEAR  CLEAR   Specific Gravity, Urine 1.011  1.005 - 1.030   pH 7.0  5.0 - 8.0   Glucose, UA NEGATIVE  NEGATIVE mg/dL   Hgb urine dipstick NEGATIVE  NEGATIVE   Bilirubin Urine NEGATIVE  NEGATIVE   Ketones, ur 15 (*) NEGATIVE mg/dL   Protein, ur NEGATIVE  NEGATIVE  mg/dL   Urobilinogen, UA 0.2  0.0 - 1.0 mg/dL   Nitrite NEGATIVE  NEGATIVE   Leukocytes, UA NEGATIVE  NEGATIVE   Comment: MICROSCOPIC NOT DONE ON URINES WITH NEGATIVE PROTEIN, BLOOD, LEUKOCYTES, NITRITE, OR GLUCOSE <1000 mg/dL.    Physical Findings: AIMS: Facial and Oral Movements Muscles of Facial Expression: None, normal Lips and Perioral Area: None, normal Jaw: None, normal Tongue: None, normal,Extremity Movements Upper (arms, wrists, hands, fingers): None, normal Lower (legs, knees, ankles, toes): None, normal, Trunk Movements Neck, shoulders, hips: None, normal, Overall Severity Severity of abnormal movements (highest score from questions above): None, normal Incapacitation due to abnormal movements: None, normal Patient's awareness of abnormal movements (rate only patient's report): No Awareness, Dental Status Current problems with teeth and/or dentures?: No Does patient usually wear dentures?: No  CIWA:  CIWA-Ar Total: 11 COWS:  COWS Total Score: 5  Treatment Plan Summary: Daily contact with patient to assess and evaluate symptoms and progress in treatment Medication management  Plan: 1. Continue crisis management and stabilization.  2. Medication management: Continue Clonidine detox for opioid dependence, Citalopram 40 mg daily for depression/anxiety, continue Neurontin to 100 mg TID for substance withdrawal syndrome/pain management, Hydroxyzine 25 mg prn for anxiety and Restoril 30 mg for sleep.  3. Encouraged patient to attend groups  and participate in group counseling sessions and activities.  4. Discharge plan in progress.  5. Continue current treatment plan.  6. Address health issues: Vitals reviewed and stable, continue all other meds for other medical issues.   Medical Decision Making Problem Points:  Review of last therapy session (1) and Review of psycho-social stressors (1) Data Points:  Review of medication regiment & side effects (2) Review of new medications or change in dosage (2)  I certify that inpatient services furnished can reasonably be expected to improve the patient's condition.   Encarnacion Slates, PMHNP-BC 12/29/2013, 1:45 PM

## 2013-12-29 NOTE — Progress Notes (Signed)
[facility] Group Notes:  (Nursing/MHT/Case Management/Adjunct)  Date:  12/29/2013  Time:  4:40 PM  Type of Therapy:  Therapeutic Activity  Participation Level:  None  Participation Quality:  Appropriate  Affect:  Appropriate  Cognitive:  Appropriate  Insight:  Appropriate  Engagement in Group:  Engaged  Modes of Intervention:  Activity  Summary of Progress/Problems: Pt attended group late, and did not engage in the activity, however pt did participate.  Clint Bolder 12/29/2013, 4:40 PM

## 2013-12-29 NOTE — Progress Notes (Signed)
Above note was reviewed. Concur with above assessment and plan.  Wong Steadham P Khyran Riera, MD 

## 2013-12-29 NOTE — Progress Notes (Signed)
D.  Pt states that she is having a difficult time with detox and was hopeful that she will feel better in the next couple of days.  Denies SI/HI/hallucinations at this time.  Still with complaint of chronic neck pain.  Positive for evening AA group, interacting appropriately within milieu.  A.  Support and encouragement offered, explained the course of detox and how long generally it lasts  R.  Pt verbalized reassurance that she is detoxing within normal parameters, remains safe on unit.  Will continue to monitor.

## 2013-12-29 NOTE — Progress Notes (Signed)
NUTRITION ASSESSMENT  Pt identified as at risk on the Malnutrition Screen Tool  INTERVENTION: 1. Educated patient on the importance of nutrition and encouraged intake of food and beverages. 2. Discussed weight goals. 3. Supplements: Ensure Complete po BID, each supplement provides 350 kcal and 13 grams of protein   NUTRITION DIAGNOSIS: Unintentional weight loss related to sub-optimal intake as evidenced by pt report.   Goal: Pt to meet >/= 90% of their estimated nutrition needs.  Monitor:  PO intake  Assessment:  Patient admitted with Opioid/benzodiazepine dependence.  Patient reports poor intake currently secondary to detox and constipation.  Variable intake prior to admit secondary to drug use, nausea, and money issues-not always having enough money to eat.  UBW 146 lbs 6 months ago per patient not confirmed by e-chart.  53 y.o. female  Height: Ht Readings from Last 1 Encounters:  12/27/13 5\' 4"  (1.626 m)    Weight: Wt Readings from Last 1 Encounters:  12/27/13 139 lb (63.05 kg)    Weight Hx: Wt Readings from Last 10 Encounters:  12/27/13 139 lb (63.05 kg)  12/27/13 146 lb (66.225 kg)  12/05/13 141 lb (63.957 kg)  09/18/13 142 lb (64.411 kg)  07/29/13 143 lb (64.864 kg)  07/22/13 144 lb 3.2 oz (65.409 kg)  04/30/13 138 lb 3.7 oz (62.7 kg)  01/24/13 141 lb 4.8 oz (64.093 kg)  01/24/13 141 lb 6 oz (64.127 kg)  01/01/13 141 lb (63.957 kg)    BMI:  Body mass index is 23.85 kg/(m^2). Pt meets criteria for normal based on current BMI.  Estimated Nutritional Needs: Kcal: 25-30 kcal/kg Protein: > 1 gram protein/kg Fluid: 1 ml/kcal  Diet Order: General Pt is also offered choice of unit snacks mid-morning and mid-afternoon.  Pt is eating as desired.   Lab results and medications reviewed.   Antonieta Iba, RD, LDN Clinical Inpatient Dietitian Pager:  [phone] Weekend and after hours pager:  [phone]

## 2013-12-30 DIAGNOSIS — F411 Generalized anxiety disorder: Secondary | ICD-10-CM

## 2013-12-30 MED ORDER — METOCLOPRAMIDE HCL 10 MG PO TABS
10.0000 mg | ORAL_TABLET | Freq: Every day | ORAL | Status: DC
  Administered 2013-12-30 – 2014-01-01 (×3): 10 mg via ORAL
  Filled 2013-12-30 (×4): qty 1

## 2013-12-30 MED ORDER — DOCUSATE SODIUM 100 MG PO CAPS
100.0000 mg | ORAL_CAPSULE | Freq: Two times a day (BID) | ORAL | Status: DC
  Administered 2013-12-30 – 2014-01-01 (×5): 100 mg via ORAL
  Filled 2013-12-30: qty 1
  Filled 2013-12-30: qty 28
  Filled 2013-12-30 (×6): qty 1
  Filled 2013-12-30: qty 28
  Filled 2013-12-30: qty 1

## 2013-12-30 MED ORDER — PRAMOXINE-ZINC OXIDE IN MO 1-12.5 % RE OINT
TOPICAL_OINTMENT | Freq: Three times a day (TID) | RECTAL | Status: DC | PRN
  Administered 2013-12-30: 1 via RECTAL
  Filled 2013-12-30 (×3): qty 28.3

## 2013-12-30 MED ORDER — GABAPENTIN 300 MG PO CAPS
300.0000 mg | ORAL_CAPSULE | Freq: Three times a day (TID) | ORAL | Status: DC
  Administered 2013-12-30 – 2014-01-01 (×7): 300 mg via ORAL
  Filled 2013-12-30: qty 42
  Filled 2013-12-30 (×5): qty 1
  Filled 2013-12-30: qty 42
  Filled 2013-12-30 (×5): qty 1
  Filled 2013-12-30: qty 42
  Filled 2013-12-30: qty 1

## 2013-12-30 MED ORDER — ARIPIPRAZOLE 2 MG PO TABS
2.0000 mg | ORAL_TABLET | Freq: Every day | ORAL | Status: DC
  Administered 2013-12-30 – 2014-01-01 (×3): 2 mg via ORAL
  Filled 2013-12-30 (×5): qty 1
  Filled 2013-12-30: qty 14
  Filled 2013-12-30: qty 1

## 2013-12-30 MED ORDER — MAGNESIUM CITRATE PO SOLN
1.0000 | Freq: Once | ORAL | Status: AC
  Administered 2013-12-30: 1 via ORAL

## 2013-12-30 NOTE — Tx Team (Signed)
Date:12/30/2013  Progress in Treatment:  Attending groups: No  Participating in groups: No  Taking medication as prescribed: Yes  Tolerating medication: Yes  Family/Significant othe contact made: none currently, awaiting to obtain consent Patient understands diagnosis: unknown at this time, patient has not been active in treatment Discussing patient identified problems/goals with staff: Yes  Medical problems stabilized or resolved: Yes  Denies suicidal/homicidal ideation: Yes Patient has not harmed self or Others: Yes   New problem(s) identified: none  Discharge Plan or Barriers:  Unknown at this time. Patient has not been attending group or active in programming. Will assess and make referrals.  Additional comments: n/a   Reason for Continuation of Hospitalization:   Anxiety Depression Medical Issues Medication stabilization Suicidal ideation Withdrawal symptoms    Estimated length of stay: 3-5 days     For review of initial/current patient goals, please see plan of care.  Attendees:  Attendees:  Patient:    Family:    Physician: Dr. Carrington Clamp  12/30/2013 10:41 AM  Nursing: Lars Pinks, RN Case manager     Clinical Social Worker Vidal Schwalbe, LCSW, MSW    Other: Earl Many, [place]   Other: Chrys Racer RN   Other: Butch Penny, RN Charge Nurse   Other:      Vidal Schwalbe, LCSW, MSW   12/30/2013

## 2013-12-30 NOTE — BHH Group Notes (Signed)
[facility] LCSW Aftercare Discharge Planning Group Note   12/30/2013 10:39 AM  Participation Quality:  Did not attend this morning. Was seen at Med window, but went back to bed.    Lilly Cove

## 2013-12-30 NOTE — Progress Notes (Signed)
D: Patient in the dayroom on approach.  Patient states she had a good day.  Patient states she was constipated but stats she was given medication and she is now able to have a bowel movement.  Patient denies SI/HI and denies AVH. A: Staff to monitor Q 15 mins for safety.  Encouragement and support offered.  Scheduled medications administered per orders.  Tucks pads given for hemorrhoids prn and Restoril administered prn for sleep.  Bentyl administered prn for abdominal cramping R: Patient remains safe on the unit.  Patient attended group tonight.  Patient visible on the unit and interacting with peers.  Patient taking administered medications.

## 2013-12-30 NOTE — Progress Notes (Signed)
Patient ID: Selena Farrell, female   DOB: June 29, 1961, 53 y.o.   MRN: [id number] Pt did not attend 10am Psychoeducational group on Wellness.

## 2013-12-30 NOTE — Progress Notes (Signed)
[facility] MD Progress Note  12/30/2013 7:00 PM Selena Farrell  MRN:  [id number] Subjective:  Having a very hard time. She endorses multiple medical issues with on going physical  symptoms, She states she is extremely depressed and anxious. The Celexa she feels help her at one time but not anymore. She is concerned about a switch after she was told that it would take 4-6 weeks for a new antidepressant to work. She endorses pain but wants to be off the opioids for good.  Diagnosis:   DSM5: Schizophrenia Disorders:  none Obsessive-Compulsive Disorders:  none Trauma-Stressor Disorders:  none Substance/Addictive Disorders:  Opioid Disorder - Moderate (304.00) Depressive Disorders:  Major Depressive Disorder - Severe (296.23) Total Time spent with patient: 30 minutes  Axis I: Generalized Anxiety Disorder  ADL's:  Intact  Sleep: Fair  Appetite:  Fair  Suicidal Ideation:  Plan:  denies Intent:  denies Means:  denies Homicidal Ideation:  Plan:  denies Intent:  denies Means:  denies AEB (as evidenced by):  Psychiatric Specialty Exam: Physical Exam  Review of Systems  Constitutional: Positive for malaise/fatigue.  HENT: Negative.   Eyes: Negative.   Respiratory: Negative.   Cardiovascular: Negative.   Gastrointestinal: Positive for nausea, abdominal pain and constipation.  Genitourinary: Negative.   Musculoskeletal: Negative.   Skin: Negative.   Neurological: Positive for weakness.  Endo/Heme/Allergies: Negative.   Psychiatric/Behavioral: Positive for depression and substance abuse. The patient is nervous/anxious and has insomnia.     Blood pressure 113/67, pulse 70, temperature 97.7 F (36.5 C), temperature source Oral, resp. rate 16, height 5\' 4"  (1.626 m), weight 63.05 kg (139 lb), SpO2 98.00%.Body mass index is 23.85 kg/(m^2).  General Appearance: Fairly Groomed  Engineer, water::  Fair  Speech:  Clear and Coherent  Volume:  Decreased  Mood:  Anxious and Depressed  Affect:   anxious, depressed, worried  Thought Process:  Coherent and Goal Directed  Orientation:  Full (Time, Place, and Person)  Thought Content:  symtpoms, worries, concerns  Suicidal Thoughts:  No  Homicidal Thoughts:  No  Memory:  Immediate;   Fair Recent;   Fair Remote;   Fair  Judgement:  Fair  Insight:  Present  Psychomotor Activity:  Restlessness  Concentration:  Fair  Recall:  [employer] of Knowledge:NA  Language: Fair  Akathisia:  No  Handed:    AIMS (if indicated):     Assets:  Desire for Improvement  Sleep:  Number of Hours: 6.25   Musculoskeletal: Strength & Muscle Tone: within normal limits Gait & Station: normal Patient leans: N/A  Current Medications: Current Facility-Administered Medications  Medication Dose Route Frequency Provider Last Rate Last Dose  . acetaminophen (TYLENOL) tablet 1,000 mg  1,000 mg Oral Q6H PRN Encarnacion Slates, NP   1,000 mg at 12/30/13 5009  . ARIPiprazole (ABILIFY) tablet 2 mg  2 mg Oral Daily Nicholaus Bloom, MD   2 mg at 12/30/13 1519  . atenolol (TENORMIN) tablet 25 mg  25 mg Oral Daily Shuvon Rankin, NP   25 mg at 12/30/13 3818  . citalopram (CELEXA) tablet 40 mg  40 mg Oral Daily Shuvon Rankin, NP   40 mg at 12/30/13 2993  . cloNIDine (CATAPRES) tablet 0.1 mg  0.1 mg Oral BH-qamhs Encarnacion Slates, NP   0.1 mg at 12/30/13 7169   Followed by  . [START ON 01/01/2014] cloNIDine (CATAPRES) tablet 0.1 mg  0.1 mg Oral QAC breakfast Encarnacion Slates, NP      .  dicyclomine (BENTYL) tablet 20 mg  20 mg Oral Q6H PRN Encarnacion Slates, NP   20 mg at 12/29/13 2107  . docusate sodium (COLACE) capsule 100 mg  100 mg Oral BID Nicholaus Bloom, MD   100 mg at 12/30/13 1707  . feeding supplement (ENSURE COMPLETE) (ENSURE COMPLETE) liquid 237 mL  237 mL Oral BID BM Darrol Jump, RD   237 mL at 12/30/13 1445  . gabapentin (NEURONTIN) capsule 300 mg  300 mg Oral TID Nicholaus Bloom, MD   300 mg at 12/30/13 1707  . hydrOXYzine (ATARAX/VISTARIL) tablet 25 mg  25 mg Oral Q6H PRN  Encarnacion Slates, NP   25 mg at 12/30/13 0814  . loperamide (IMODIUM) capsule 2-4 mg  2-4 mg Oral PRN Encarnacion Slates, NP      . magnesium hydroxide (MILK OF MAGNESIA) suspension 30 mL  30 mL Oral Daily PRN Shuvon Rankin, NP   30 mL at 12/29/13 1641  . methocarbamol (ROBAXIN) tablet 500 mg  500 mg Oral Q8H PRN Encarnacion Slates, NP   500 mg at 12/29/13 2107  . metoCLOPramide (REGLAN) tablet 10 mg  10 mg Oral QHS Nicholaus Bloom, MD      . metoCLOPramide (REGLAN) tablet 5 mg  5 mg Oral TID AC Nicholaus Bloom, MD   5 mg at 12/30/13 1707  . nicotine (NICODERM CQ - dosed in mg/24 hours) patch 21 mg  21 mg Transdermal Daily Shuvon Rankin, NP   21 mg at 12/30/13 0175  . ondansetron (ZOFRAN-ODT) disintegrating tablet 4 mg  4 mg Oral Q6H PRN Encarnacion Slates, NP   4 mg at 12/29/13 2107  . pantoprazole (PROTONIX) EC tablet 40 mg  40 mg Oral Daily Shuvon Rankin, NP   40 mg at 12/30/13 0811  . temazepam (RESTORIL) capsule 30 mg  30 mg Oral QHS PRN Shuvon Rankin, NP   30 mg at 12/29/13 2107    Lab Results: No results found for this or any previous visit (from the past 48 hour(s)).  Physical Findings: AIMS: Facial and Oral Movements Muscles of Facial Expression: None, normal Lips and Perioral Area: None, normal Jaw: None, normal Tongue: None, normal,Extremity Movements Upper (arms, wrists, hands, fingers): None, normal Lower (legs, knees, ankles, toes): None, normal, Trunk Movements Neck, shoulders, hips: None, normal, Overall Severity Severity of abnormal movements (highest score from questions above): None, normal Incapacitation due to abnormal movements: None, normal Patient's awareness of abnormal movements (rate only patient's report): No Awareness, Dental Status Current problems with teeth and/or dentures?: No Does patient usually wear dentures?: No  CIWA:  CIWA-Ar Total: 0 COWS:  COWS Total Score: 8  Treatment Plan Summary: Daily contact with patient to assess and evaluate symptoms and progress in  treatment Medication management  Plan: Supportive approach/coping skills/relapse prevention           Add Abilify to augment the Celexa           Neurontin 300 mg TID            CBT;mindfulness  Medical Decision Making Problem Points:  Review of psycho-social stressors (1) Data Points:  Review of medication regiment & side effects (2) Review of new medications or change in dosage (2)  I certify that inpatient services furnished can reasonably be expected to improve the patient's condition.   Nicholaus Bloom 12/30/2013, 7:00 PM

## 2013-12-30 NOTE — BHH Group Notes (Signed)
[facility] LCSW Group Therapy  12/30/2013 2:58 PM  Type of Therapy:  Group Therapy  Participation Level:  Active  Participation Quality:  Attentive and Sharing  Affect:  Tearful  Cognitive:  Alert and Oriented  Insight:  Developing/Improving  Engagement in Therapy:  Engaged  Modes of Intervention:  Discussion, Exploration and Problem-solving  Summary of Progress/Problems:  Group topic today consisted of a discussion around obstacles, specifically fear as an obstacle.  Members were asked to process and use the acronym False, Evidence, Appearing Real as it relates to their obstacles of achieving goals, sobriety, and a future.   Selena Farrell was very invested in group topic today talking about her obstacle of moving forward is being a people pleaser.  She shares she is afraid of having people hate her or being angry, but has no rationale as to how that will affect her moving forward.  Selena Farrell was able to work through the [place] and apply to her life as the fear of helping others and taking on too much is exhausting her and not giving her time to deal with her own problems.  Selena Farrell was very tearful and depressed in group, but did interact well with other members.  Selena Farrell 12/30/2013, 2:58 PM

## 2013-12-30 NOTE — Progress Notes (Signed)
Patient ID: Selena Farrell, female   DOB: Jan 19, 1961, 53 y.o.   MRN: [id number] She has been up and about and to groups interacting with peers and staff. Has requested and received prn for anxiety this AM and had new order for Mag Citrate that was given to her.

## 2013-12-31 DIAGNOSIS — F322 Major depressive disorder, single episode, severe without psychotic features: Secondary | ICD-10-CM

## 2013-12-31 LAB — DRUGS OF ABUSE SCREEN W/O ALC, ROUTINE URINE
Amphetamine Screen, Ur: NEGATIVE
Barbiturate Quant, Ur: POSITIVE — AB
Benzodiazepines.: POSITIVE — AB
COCAINE METABOLITES: NEGATIVE
CREATININE, U: 128.1 mg/dL
Marijuana Metabolite: NEGATIVE
Methadone: NEGATIVE
Opiate Screen, Urine: NEGATIVE
PROPOXYPHENE: NEGATIVE
Phencyclidine (PCP): NEGATIVE

## 2013-12-31 NOTE — Progress Notes (Addendum)
Patient ID: Selena Farrell, female   DOB: [birth year], 53 y.o.   MRN: [id number] She has been up and to groups interacting with peers and staff. Requested and received prn for headache that was effective. She stated that the mag citirate was effective. Self inventory: depression 6, hopelessness 5, indicated that withdrawal symptoms of agitation  tremors and chilling.  She has not requested w/d PRN. She denies SI thoughts.

## 2013-12-31 NOTE — Progress Notes (Signed)
Patient ID: Selena Farrell, female   DOB: 03/25/1961, 53 y.o.   MRN: [id number] [facility] MD Progress Note  12/31/2013 3:43 PM Selena Farrell  MRN:  [id number]  Subjective: [name] says, "I am doing better today than I was yesterday. However, I'm still not sleeping at night. My mind still races at night time. My anxiety remains high and I am depressed as well. But, I'm trying to do all I needed to do to help myself. I'm not suicidal, I just don't feel good about myself. I worry about everybody's problems a lot, I have done this for many years". Selena Farrell is participating in groups. Needs reinforcement and validation that she trying very hard.   Diagnosis:   DSM5: Schizophrenia Disorders:  none Obsessive-Compulsive Disorders:  none Trauma-Stressor Disorders:  none Substance/Addictive Disorders:  Opioid Disorder - Moderate (304.00) Depressive Disorders:  Major Depressive Disorder - Severe (296.23) Total Time spent with patient: 30 minutes  Axis I: Generalized Anxiety Disorder  ADL's:  Intact  Sleep: Fair  Appetite:  Fair  Suicidal Ideation:  Plan:  denies Intent:  denies Means:  denies Homicidal Ideation:  Plan:  denies Intent:  denies Means:  denies AEB (as evidenced by):  Psychiatric Specialty Exam: Physical Exam  Review of Systems  Constitutional: Positive for malaise/fatigue.  HENT: Negative.   Eyes: Negative.   Respiratory: Negative.   Cardiovascular: Negative.   Gastrointestinal: Positive for nausea, abdominal pain and constipation.  Genitourinary: Negative.   Musculoskeletal: Negative.   Skin: Negative.   Neurological: Positive for weakness.  Endo/Heme/Allergies: Negative.   Psychiatric/Behavioral: Positive for depression and substance abuse. The patient is nervous/anxious and has insomnia.     Blood pressure 133/77, pulse 71, temperature 98.1 F (36.7 C), temperature source Oral, resp. rate 20, height 5\' 4"  (1.626 m), weight 63.05 kg (139 lb), SpO2 98.00%.Body mass  index is 23.85 kg/(m^2).  General Appearance: Fairly Groomed  Engineer, water::  Fair  Speech:  Clear and Coherent  Volume:  Decreased  Mood:  Anxious and Depressed  Affect:  anxious, depressed, worried  Thought Process:  Coherent and Goal Directed  Orientation:  Full (Time, Place, and Person)  Thought Content:  symtpoms, worries, concerns  Suicidal Thoughts:  No  Homicidal Thoughts:  No  Memory:  Immediate;   Fair Recent;   Fair Remote;   Fair  Judgement:  Fair  Insight:  Present  Psychomotor Activity:  Restlessness  Concentration:  Fair  Recall:  [employer] of Knowledge:NA  Language: Fair  Akathisia:  No  Handed:    AIMS (if indicated):     Assets:  Desire for Improvement  Sleep:  Number of Hours: 6   Musculoskeletal: Strength & Muscle Tone: within normal limits Gait & Station: normal Patient leans: N/A  Current Medications: Current Facility-Administered Medications  Medication Dose Route Frequency Provider Last Rate Last Dose  . acetaminophen (TYLENOL) tablet 1,000 mg  1,000 mg Oral Q6H PRN Encarnacion Slates, NP   1,000 mg at 12/31/13 0851  . ARIPiprazole (ABILIFY) tablet 2 mg  2 mg Oral Daily Nicholaus Bloom, MD   2 mg at 12/31/13 [phone]  . atenolol (TENORMIN) tablet 25 mg  25 mg Oral Daily Shuvon Rankin, NP   25 mg at 12/31/13 0811  . citalopram (CELEXA) tablet 40 mg  40 mg Oral Daily Shuvon Rankin, NP   40 mg at 12/31/13 0811  . cloNIDine (CATAPRES) tablet 0.1 mg  0.1 mg Oral BH-qamhs Encarnacion Slates, NP   0.1  mg at 12/31/13 0810   Followed by  . [START ON 01/01/2014] cloNIDine (CATAPRES) tablet 0.1 mg  0.1 mg Oral QAC breakfast Encarnacion Slates, NP      . dicyclomine (BENTYL) tablet 20 mg  20 mg Oral Q6H PRN Encarnacion Slates, NP   20 mg at 12/31/13 0616  . docusate sodium (COLACE) capsule 100 mg  100 mg Oral BID Nicholaus Bloom, MD   100 mg at 12/31/13 0973  . feeding supplement (ENSURE COMPLETE) (ENSURE COMPLETE) liquid 237 mL  237 mL Oral BID BM Darrol Jump, RD   237 mL at 12/31/13  1428  . gabapentin (NEURONTIN) capsule 300 mg  300 mg Oral TID Nicholaus Bloom, MD   300 mg at 12/31/13 1156  . hydrOXYzine (ATARAX/VISTARIL) tablet 25 mg  25 mg Oral Q6H PRN Encarnacion Slates, NP   25 mg at 12/30/13 0814  . loperamide (IMODIUM) capsule 2-4 mg  2-4 mg Oral PRN Encarnacion Slates, NP      . magnesium hydroxide (MILK OF MAGNESIA) suspension 30 mL  30 mL Oral Daily PRN Shuvon Rankin, NP   30 mL at 12/29/13 1641  . methocarbamol (ROBAXIN) tablet 500 mg  500 mg Oral Q8H PRN Encarnacion Slates, NP   500 mg at 12/29/13 2107  . metoCLOPramide (REGLAN) tablet 10 mg  10 mg Oral QHS Nicholaus Bloom, MD   10 mg at 12/30/13 2113  . metoCLOPramide (REGLAN) tablet 5 mg  5 mg Oral TID AC Nicholaus Bloom, MD   5 mg at 12/31/13 1156  . nicotine (NICODERM CQ - dosed in mg/24 hours) patch 21 mg  21 mg Transdermal Daily Shuvon Rankin, NP   21 mg at 12/31/13 5329  . ondansetron (ZOFRAN-ODT) disintegrating tablet 4 mg  4 mg Oral Q6H PRN Encarnacion Slates, NP   4 mg at 12/29/13 2107  . pantoprazole (PROTONIX) EC tablet 40 mg  40 mg Oral Daily Shuvon Rankin, NP   40 mg at 12/31/13 0811  . pramoxine-mineral oil-zinc (TUCKS) rectal ointment   Rectal TID PRN Laverle Hobby, PA-C   1 application at 92/42/68 2113  . temazepam (RESTORIL) capsule 30 mg  30 mg Oral QHS PRN Shuvon Rankin, NP   30 mg at 12/30/13 2113    Lab Results: No results found for this or any previous visit (from the past 48 hour(s)).  Physical Findings: AIMS: Facial and Oral Movements Muscles of Facial Expression: None, normal Lips and Perioral Area: None, normal Jaw: None, normal Tongue: None, normal,Extremity Movements Upper (arms, wrists, hands, fingers): None, normal Lower (legs, knees, ankles, toes): None, normal, Trunk Movements Neck, shoulders, hips: None, normal, Overall Severity Severity of abnormal movements (highest score from questions above): None, normal Incapacitation due to abnormal movements: None, normal Patient's awareness of abnormal  movements (rate only patient's report): No Awareness, Dental Status Current problems with teeth and/or dentures?: No Does patient usually wear dentures?: No  CIWA:  CIWA-Ar Total: 0 COWS:  COWS Total Score: 8  Treatment Plan Summary: Daily contact with patient to assess and evaluate symptoms and progress in treatment Medication management  Plan: Supportive approach/coping skills/relapse prevention Continue Abilify to augment the Celexa Continue Neurontin 300 mg TID CBT;mindfulness. Add Seroquel 50 mg Q bedtime x 3 nights for racing thoughts.  Medical Decision Making Problem Points:  Review of psycho-social stressors (1) Data Points:  Review of medication regiment & side effects (2) Review of new medications or change in  dosage (2)  I certify that inpatient services furnished can reasonably be expected to improve the patient's condition.   Encarnacion Slates, PMHNP-BC 12/31/2013, 3:43 PM Personally evaluated the patient and agree with assessment and plan Geralyn Flash A. Sabra Heck, M.D.

## 2013-12-31 NOTE — Progress Notes (Signed)
Recreation Therapy Notes  nimal-Assisted Activity/Therapy (AAA/T) Program Checklist/Progress Notes Patient Eligibility Criteria Checklist & Daily Group note for Rec Tx Intervention  Date: 04.21.2015 Time: 2:45pm Location: [address] Dayroom    AAA/T Program Assumption of Risk Form signed by Patient/ or Parent Legal Guardian yes  Patient is free of allergies or sever asthma yes  Patient reports no fear of animals yes  Patient reports no history of cruelty to animals yes   Patient understands his/her participation is voluntary yes  Behavioral Response: Did not attend.   Selena Farrell, LRT/CTRS        Selena Farrell Selena Farrell 12/31/2013 4:29 PM 

## 2013-12-31 NOTE — Progress Notes (Signed)
D: Patient in the dayroom on approach.  Patient states she had a good day but states she was having abdominal cramping and spasms in her legs.  Patient states over all she had a better day.  Patient states she is no longer going to take pain medications and states she did not realize she was getting addicted to them.  Patient denies SI/HI and denies AVH. A: Staff to monitor Q 15 mins for safety.  Encouragement and support offered.  Scheduled medications administered per orders.  Robaxin administered prn for muscle spasms in her legs and bentyl administered prn for abdominal cramping. R: Patient remains safe on the unit.  Patient attended group tonight.  Patient visible on the unit and interacting with peers.  Patient taking administered medications.

## 2013-12-31 NOTE — Progress Notes (Signed)
[place] Group Notes:  (Nursing/MHT/Case Management/Adjunct)  Date:  12/31/2013  Time:  8:00 p.m.Marland Kitchen   Type of Therapy:  Psychoeducational Skills  Participation Level:  Minimal  Participation Quality:  Appropriate  Affect:  Appropriate  Cognitive:  Appropriate  Insight:  Lacking  Engagement in Group:  Developing/Improving  Modes of Intervention:  Education  Summary of Progress/Problems: The patient stated that she had a lot of "ups and downs" throughout the day. Her goal for tomorrow is to work on getting better and focusing on herself.   Gennette Pac 12/31/2013, 10:10 PM

## 2013-12-31 NOTE — Progress Notes (Signed)
Adult Psychoeducational Group Note  Date:  12/31/2013 Time:  9:52 AM  Group Topic/Focus:  Recovery Goals:   The focus of this group is to identify appropriate goals for recovery and establish a plan to achieve them.  Participation Level:  Active  Participation Quality:  Appropriate, Attentive and Sharing  Affect:  Appropriate  Cognitive:  Alert, Appropriate and Oriented  Insight: Appropriate  Engagement in Group:  Engaged and Supportive  Modes of Intervention:  Discussion, Education, Socialization and Support  Additional Comments:  Pt attended and participated in group.    Milus Glazier 12/31/2013, 9:52 AM

## 2013-12-31 NOTE — BHH Group Notes (Signed)
[place] LCSW Group Therapy  12/31/2013 1:51 PM  Type of Therapy:  Group Therapy  Participation Level:  Active  Participation Quality:  Attentive  Affect:  Appropriate  Cognitive:  Alert and Oriented  Insight:  Developing and Improving  Engagement in Therapy:  Engaged  Modes of Intervention:  Discussion and Exploration  Summary of Progress/Problems:  Group today consisted of [employer] speaker discussing his personal story related to recovery with substance abuse and mental health.  Neoma Laming attended the session, listened attentively and engaged with speaker.    Lilly Cove 12/31/2013, 1:51 PM

## 2013-12-31 NOTE — BHH Suicide Risk Assessment (Signed)
Selena Farrell INPATIENT:  Family/Significant Other Suicide Prevention Education  Suicide Prevention Education:  Education Completed; Wells Guiles (patient daughter) [phone],  (name of family member/significant other) has been identified by the patient as the family member/significant other with whom the patient will be residing, and identified as the person(s) who will aid the patient in the event of a mental health crisis (suicidal ideations/suicide attempt).  With written consent from the patient, the family member/significant other has been provided the following suicide prevention education, prior to the and/or following the discharge of the patient.  The suicide prevention education provided includes the following:  Suicide risk factors  Suicide prevention and interventions  National Suicide Hotline telephone number  [facility] assessment telephone number  [facility] Emergency Assistance [place] and/or Residential Mobile Crisis Unit telephone number  Request made of family/significant other to:  Remove weapons (e.g., guns, rifles, knives), all items previously/currently identified as safety concern.    Remove drugs/medications (over-the-counter, prescriptions, illicit drugs), all items previously/currently identified as a safety concern.  The family member/significant other verbalizes understanding of the suicide prevention education information provided.  The family member/significant other agrees to remove the items of safety concern listed above.  Daughter reports she did have a gun but patient family took the gun and when house searched there was no gun was found.  Patient daughter reports she lives alone, but daughter will be around to support mother after discharged.   Lilly Cove 12/31/2013, 11:24 AM

## 2014-01-01 DIAGNOSIS — F321 Major depressive disorder, single episode, moderate: Secondary | ICD-10-CM

## 2014-01-01 DIAGNOSIS — F1120 Opioid dependence, uncomplicated: Secondary | ICD-10-CM

## 2014-01-01 LAB — BENZODIAZEPINE, QUANTITATIVE, URINE
Alprazolam metabolite (GC/LC/MS), ur confirm: NEGATIVE ng/mL
CLONAZEPAU: NEGATIVE ng/mL
Diazepam (GC/LC/MS), ur confirm: NEGATIVE ng/mL
Estazolam (GC/LC/MS), ur confirm: NEGATIVE ng/mL
FLUNITRAZEPU: NEGATIVE ng/mL
Flurazepam GC/MS Conf: NEGATIVE ng/mL
HYDROXYALPRAZ UR: NEGATIVE ng/mL
LORAZEPAMU: NEGATIVE ng/mL
Midazolam (GC/LC/MS), ur confirm: NEGATIVE ng/mL
Nordiazepam GC/MS Conf: NEGATIVE ng/mL
Oxazepam GC/MS Conf: 1586 ng/mL
TRIAZOLAMU: NEGATIVE ng/mL
Temazepam GC/MS Conf: >10000 ng/mL

## 2014-01-01 LAB — BARBITURATE, URINE, CONFIRMATION
Amobarbital UR Quant: NEGATIVE ng/mL
Butalbital UR Quant: NEGATIVE ng/mL
Pentobarbital GC/MS Conf: NEGATIVE ng/mL
Phenobarbital GC/MS Conf: 481 ng/mL
Secobarbital GC/MS Conf: NEGATIVE ng/mL

## 2014-01-01 MED ORDER — LIDOCAINE 5 % EX PTCH: 1.0000 | MEDICATED_PATCH | Freq: Every day | CUTANEOUS | Status: DC

## 2014-01-01 MED ORDER — OMEPRAZOLE 20 MG PO CPDR: 40.0000 mg | DELAYED_RELEASE_CAPSULE | Freq: Every day | ORAL | Status: DC

## 2014-01-01 MED ORDER — ARIPIPRAZOLE 2 MG PO TABS: 2.0000 mg | ORAL_TABLET | Freq: Every day | ORAL | Status: DC

## 2014-01-01 MED ORDER — DOCUSATE SODIUM 100 MG PO CAPS: 100.0000 mg | ORAL_CAPSULE | Freq: Every day | ORAL | Status: DC | PRN

## 2014-01-01 MED ORDER — DOCUSATE SODIUM 100 MG PO CAPS
100.0000 mg | ORAL_CAPSULE | Freq: Every day | ORAL | Status: DC
  Filled 2014-01-01: qty 14

## 2014-01-01 MED ORDER — TEMAZEPAM 30 MG PO CAPS: 30.0000 mg | ORAL_CAPSULE | Freq: Every evening | ORAL | Status: DC | PRN

## 2014-01-01 MED ORDER — PANTOPRAZOLE SODIUM 40 MG PO TBEC
80.0000 mg | DELAYED_RELEASE_TABLET | Freq: Every day | ORAL | Status: DC
  Filled 2014-01-01: qty 2

## 2014-01-01 MED ORDER — HYDROXYZINE HCL 25 MG PO TABS: ORAL_TABLET | ORAL | Status: AC

## 2014-01-01 MED ORDER — METOCLOPRAMIDE HCL 5 MG PO TABS: ORAL_TABLET | ORAL | Status: DC

## 2014-01-01 MED ORDER — ATENOLOL 25 MG PO TABS: 25.0000 mg | ORAL_TABLET | Freq: Every day | ORAL | Status: AC

## 2014-01-01 MED ORDER — NICOTINE 21 MG/24HR TD PT24: 21.0000 mg | MEDICATED_PATCH | Freq: Every day | TRANSDERMAL | Status: DC

## 2014-01-01 MED ORDER — LIDOCAINE 5 % EX PTCH
1.0000 | MEDICATED_PATCH | Freq: Every day | CUTANEOUS | Status: DC
  Administered 2014-01-01: 1 via TRANSDERMAL
  Filled 2014-01-01 (×4): qty 1
  Filled 2014-01-01: qty 15

## 2014-01-01 MED ORDER — KETOROLAC TROMETHAMINE 30 MG/ML IJ SOLN
30.0000 mg | Freq: Once | INTRAMUSCULAR | Status: AC
  Administered 2014-01-01: 30 mg via INTRAMUSCULAR
  Filled 2014-01-01 (×2): qty 1

## 2014-01-01 MED ORDER — HYDROXYZINE HCL 25 MG PO TABS
25.0000 mg | ORAL_TABLET | Freq: Three times a day (TID) | ORAL | Status: DC | PRN
  Filled 2014-01-01: qty 42

## 2014-01-01 MED ORDER — GABAPENTIN 300 MG PO CAPS: 300.0000 mg | ORAL_CAPSULE | Freq: Three times a day (TID) | ORAL | Status: AC

## 2014-01-01 MED ORDER — CITALOPRAM HYDROBROMIDE 40 MG PO TABS: 40.0000 mg | ORAL_TABLET | Freq: Every day | ORAL | Status: DC

## 2014-01-01 NOTE — BHH Group Notes (Signed)
[facility] LCSW Aftercare Discharge Planning Group Note   01/01/2014 9:53 AM  Participation Quality:  Minimal  Mood/Affect:  Depressed  Depression Rating:  7  Anxiety Rating:  7  Thoughts of Suicide:  No Will you contract for safety?   Yes  Current AVH:  No  Plan for Discharge/Comments:  Barbie left early due to pain in the neck as well as back and asked to leave early.  Patient reports still being interested in long term Virgie: unknown  Supports: family  Lilly Cove

## 2014-01-01 NOTE — Progress Notes (Signed)
Patient ID: Selena Farrell, female   DOB: 1961-08-23, 53 y.o.   MRN: [id number] She has been in bed most of the day c/o headache Has received prn medication that was not effective. Order wrote for Toradol and a lidocaine patch that has helped a little.Self inventory: Drepression 5, hopelessness 5 withdrawals of chilling , and denies SI thoughts.

## 2014-01-01 NOTE — Progress Notes (Signed)
[facility] Adult Case Management Discharge Plan :  Will you be returning to the same living situation after discharge: No.  Long term treatment At discharge, do you have transportation home?:Yes,  patient daughter Do you have the ability to pay for your medications:Yes,  samples given to patient  Release of information consent forms completed and in the chart;  Patient's signature needed at discharge.  Patient to Follow up at: Follow-up Information   Follow up with [facility] On 01/02/2014. (You will admit to [facility] residental for long term treatment before 8am.)    Contact information:   [address] [place], [place] [address] [phone]      Call [place]. (Follow up with [facility] for medicaitons after you have completed long term Substance abuse treatment at [facility].  You can walk in any time M-F 8am-3pm.)    Contact information:   [address] [place], [place] [address] [phone]      Patient denies SI/HI:   Yes,  yes no reports    Safety Planning and Suicide Prevention discussed:  Yes,  see SI NOte  Lilly Cove 01/01/2014, 2:59 PM

## 2014-01-01 NOTE — Tx Team (Signed)
Date:01/01/2014  Progress in Treatment:  Attending groups: Yes  Participating in groups: Yes  Taking medication as prescribed: Yes  Tolerating medication: Yes  Family/Significant othe contact made: spoke with patient daughter Patient understands diagnosis: yes, patient reports feeling alone and in pain all the time making her abuse drugs and alcohol. Discussing patient identified problems/goals with staff: Yes  Medical problems stabilized or resolved: Yes  Denies suicidal/homicidal ideation: Yes Patient has not harmed self or Others: Yes   New problem(s) identified: none  Discharge Plan or Barriers:  Patient will go to [facility] tomorrow on 4.23 for long term treatment.  Additional comments: n/a   Reason for Continuation of Hospitalization:   DC to long term treatment on 4/23 Medication stablization    Estimated length of stay: 3-5 days     For review of initial/current patient goals, please see plan of care.  Attendees:  Attendees:  Patient:    Family:    Physician: Dr. Carrington Clamp  01/01/2014 12:12 PM  Nursing: Lars Pinks, RN Case manager     Clinical Social Worker Vidal Schwalbe, LCSW, MSW    Other: Earl Many, [place]   Other: Chrys Racer RN   Other: Butch Penny, RN Charge Nurse   Other:      Vidal Schwalbe, LCSW, MSW   01/01/2014

## 2014-01-01 NOTE — Progress Notes (Addendum)
Patient has been accepted to [facility] on Thursday April 23rd.  LCSW will notify treatment team and also patient regarding DC plan.  LCSW spoke with patient's daughter who is willing to take her to treatment tomorrow am. Picking up around Elmore Nail, MSW, [place] [phone]

## 2014-01-01 NOTE — Discharge Summary (Signed)
Physician Discharge Summary Note  Patient:  Selena Farrell is an 53 y.o., female MRN:  [id number] DOB:  March 04, 1961 Patient phone:  [phone] (home)  Patient address:   [place] [address],  Total Time spent with patient: Greater than 30 minutes  Date of Admission:  12/27/2013 Date of Discharge: 01/02/14  Reason for Admission:  Opioid detox  Discharge Diagnoses: Principal Problem:   Opioid type dependence Active Problems:   MDD (major depressive disorder)   Psychiatric Specialty Exam: Physical Exam  Constitutional: She is oriented to person, place, and time. She appears well-developed.  HENT:  Head: Normocephalic.  Eyes: Pupils are equal, round, and reactive to light.  Neck: Normal range of motion.  Cardiovascular: Normal rate.   Respiratory: Effort normal.  GI: Soft.  Musculoskeletal: Normal range of motion.  Neurological: She is alert and oriented to person, place, and time.  Skin: Skin is warm and dry.  Psychiatric: Her speech is normal and behavior is normal. Judgment and thought content normal. Her mood appears not anxious. Her affect is not angry, not blunt, not labile and not inappropriate. Cognition and memory are normal. She does not exhibit a depressed mood.    Review of Systems  Constitutional: Negative.   HENT: Negative.   Eyes: Negative.   Respiratory: Negative.   Cardiovascular: Negative.   Gastrointestinal: Negative.   Genitourinary: Negative.   Musculoskeletal: Negative.   Skin: Negative.   Neurological: Negative.   Endo/Heme/Allergies: Negative.   Psychiatric/Behavioral: Positive for depression (Stable) and substance abuse (Opioid dependence). Negative for suicidal ideas, hallucinations and memory loss. The patient has insomnia (Stable). The patient is not nervous/anxious.     Blood pressure 124/77, pulse 75, temperature 97.7 F (36.5 C), temperature source Oral, resp. rate 16, height 5\' 4"  (1.626 m), weight 63.05 kg (139 lb),  SpO2 98.00%.Body mass index is 23.85 kg/(m^2).  General Appearance: Fairly Groomed  Engineer, water:: Fair  Speech: Clear and Coherent  Volume: Normal  Mood: Euthymic  Affect: Appropriate  Thought Process: Coherent and Goal Directed  Orientation: Full (Time, Place, and Person)  Thought Content: relapse prevention Farrell, pain management  Suicidal Thoughts: No  Homicidal Thoughts: No Memory: Immediate; Fair  Recent; Fair  Remote; Fair  Judgement: Fair  Insight: Present  Psychomotor Activity: Normal  Concentration: Fair  Recall: [employer] of Knowledge:NA  Language: Fair  Akathisia: No  Handed:  AIMS (if indicated):  Assets: Desire for Improvement  Sleep: Number of Hours: 6.5   Past Psychiatric History: Diagnosis:  Opioid dependence, Major Depressive Disorder - Moderate (296.22)  Hospitalizations: [facility] adult units  Outpatient Care: [facility]  Substance Abuse Care: [facility]  Self-Mutilation: NA  Suicidal Attempts: NA  Violent Behaviors: NA   Musculoskeletal: Strength & Muscle Tone: within normal limits Gait & Station: normal Patient leans: N/A  DSM5: Schizophrenia Disorders:  NA Obsessive-Compulsive Disorders:  NA Trauma-Stressor Disorders:  NA Substance/Addictive Disorders:  Opioid Disorder - Severe (304.00) Depressive Disorders:  Major Depressive Disorder - Moderate (296.22)  Axis Diagnosis:   AXIS I:   Opioid dependence, Major Depressive Disorder - Moderate (296.22) AXIS II:  Deferred AXIS III:   Past Medical History  Diagnosis Date  . Hypertension   . Gastritis 05/2011    EGD-Dr. Hilarie Farrell   . Gastric ulcer 05/2011    EGD-Dr. Hilarie Farrell   . Depression   . Nicotine dependence   . Alcohol dependence     Quit Sept. 2012   . Helicobacter pylori (H. pylori) infection  Hx of   . External hemorrhoid   . Delayed gastric emptying   . Choledocholithiasis 06/23/2011    ERCP, shincterotomy and baloon stone extraction by Dr Selena Farrell.   . Bronchitis   . Cough  07/29/2013  . Hyponatremia   . Tenosynovitis     right MC joint   AXIS IV:  other psychosocial or environmental problems and Substance dependence AXIS V:  62  Level of Care:  [facility]  Hospital Course:  This is a 53 year old Caucasian female, She reports, "The sheriff took me to the hospital. My daughter had called them to come check on me. I have been sick to my stomach. I have a lot of pain. It's been going on x 3 weeks. I have been on pain/anxiety medicines for a long time. Then on February 10th this year, I had neck surgery. Then I started to double up on my pain/anxiety medicines. When I tried to cut down on how much of these medicines that I was taking, I got very sick. It was horrible. I felt so bad, I thought about dying. I would like to go to a tx center after my discharge from here".  Selena Farrell was admitted to the hospital with her UDS reports showing positive Benzodiazepines. She also admitted having been abusing her opiates. Selena Farrell also is battling major depressive disorder. She required drug detox as well as mood stabilization. She received Clonidine detox protocols, Citalopram 40 mg daily for depression, Gabapentin 300 mg three times daily for substance withdrawal syndrome, Hydroxyzine 25 mg three times daily for anxiety, Abilify 2 mg as an adjunct treatment for depression and Temazepam 30 mg Q bedtime for sleep. She was enrolled in the group counseling sessions, AA/NA meetings being offered and held on this unit. She learned coping skills. Selena Farrell was resumed on all her pertinent home medications for her other medical issues. She tolerated her treatment regimen without any adverse effects and or reactions.   Selena Farrell has completed detox treatment and her mood stabilized. She is currently being discharged to continue substance abuse treatment at the [facility] in [place], [place]. And for medication management and routine psychiatric care, she will receive these services at the  [facility] clinic here in [place], [place]. Upon discharge, she adamantly denies any SIHI, AVH, paranoia and, delusional thoughts or withdrawal symptoms. She received from [place], a 2 weeks worth, supply samples of her [facility] discharge medications. She left [facility] with all personal belongings in no distress. Transportation per daughter.  Consults:  psychiatry  Significant Diagnostic Studies:  labs: CBC with diff, CMP, UDS, toxicology tests, U/A  Discharge Vitals:   Blood pressure 124/77, pulse 75, temperature 97.7 F (36.5 C), temperature source Oral, resp. rate 16, height 5\' 4"  (1.626 m), weight 63.05 kg (139 lb), SpO2 98.00%. Body mass index is 23.85 kg/(m^2). Lab Results:   No results found for this or any previous visit (from the past 72 hour(s)).  Physical Findings: AIMS: Facial and Oral Movements Muscles of Facial Expression: None, normal Lips and Perioral Area: None, normal Jaw: None, normal Tongue: None, normal,Extremity Movements Upper (arms, wrists, hands, fingers): None, normal Lower (legs, knees, ankles, toes): None, normal, Trunk Movements Neck, shoulders, hips: None, normal, Overall Severity Severity of abnormal movements (highest score from questions above): None, normal Incapacitation due to abnormal movements: None, normal Patient's awareness of abnormal movements (rate only patient's report): No Awareness, Dental Status Current problems with teeth and/or dentures?: No Does patient usually wear dentures?:  No  CIWA:  CIWA-Ar Total: 0 COWS:  COWS Total Score: 3  Psychiatric Specialty Exam: See Psychiatric Specialty Exam and Suicide Risk Assessment completed by Attending Physician prior to discharge.  Discharge destination:  [facility]  Is patient on multiple antipsychotic therapies at discharge:  No   Has Patient had three or more failed trials of antipsychotic monotherapy by history:  No  Recommended Farrell for Multiple Antipsychotic Therapies: NA      Medication List    STOP taking these medications       levonorgestrel 20 MCG/24HR IUD  Commonly known as:  MIRENA     LORazepam 1 MG tablet  Commonly known as:  ATIVAN     promethazine 25 MG tablet  Commonly known as:  PHENERGAN     traMADol 50 MG tablet  Commonly known as:  ULTRAM      TAKE these medications     Indication   ARIPiprazole 2 MG tablet  Commonly known as:  ABILIFY  Take 1 tablet (2 mg total) by mouth daily. For mood control   Indication:  Mood control     atenolol 25 MG tablet  Commonly known as:  TENORMIN  Take 1 tablet (25 mg total) by mouth daily. For High blood pressure   Indication:  High Blood Pressure     citalopram 40 MG tablet  Commonly known as:  CELEXA  Take 1 tablet (40 mg total) by mouth daily. For depression   Indication:  Depression     docusate sodium 100 MG capsule  Commonly known as:  COLACE  Take 1 capsule (100 mg total) by mouth daily as needed for mild constipation.   Indication:  Constipation     gabapentin 300 MG capsule  Commonly known as:  NEURONTIN  Take 1 capsule (300 mg total) by mouth 3 (three) times daily. For substance withdrawal syndrome/pain management   Indication:  Agitation, Pain     hydrOXYzine 25 MG tablet  Commonly known as:  ATARAX/VISTARIL  Take 25 mg (1 tablet) three times daily as needed for anxiety   Indication:  Tension, Anxiety     lidocaine 5 %  Commonly known as:  LIDODERM  Place 1 patch onto the skin daily. Remove & Discard patch within 12 hours or as directed by MD: For pain   Indication:  Pain management     metoCLOPramide 5 MG tablet  Commonly known as:  REGLAN  Take 1 tablet (5 mg) three times daily & 2 tablets (10 mg) at bedtime: Acid reflux   Indication:  Gastroesophageal Reflux Disease with Current Symptoms     nicotine 21 mg/24hr patch  Commonly known as:  NICODERM CQ - dosed in mg/24 hours  Place 1 patch (21 mg total) onto the skin daily. For Nicotine addiction   Indication:   Nicotine Addiction     omeprazole 20 MG capsule  Commonly known as:  PRILOSEC  Take 2 capsules (40 mg total) by mouth daily. For acid reflux   Indication:  Gastroesophageal Reflux Disease with Current Symptoms     temazepam 30 MG capsule  Commonly known as:  RESTORIL  Take 1 capsule (30 mg total) by mouth at bedtime as needed for sleep.   Indication:  Trouble Sleeping       Follow-up Information   Follow up with [facility] On 01/02/2014. (You will admit to [facility] residental for long term treatment before 8am.)    Contact information:   [address] [place], [place]  [id number]      Call [place]. (Follow up with [facility] for medicaitons after you have completed long term Substance abuse treatment at [facility].  You can walk in any time M-F 8am-3pm.)    Contact information:   [address] [place], [place] [address] [phone]     Follow-up recommendations:  Activity:  As tolerated Diet: As recommended by your primary care doctor. Keep all scheduled follow-up appointments as recommended.  Comments: Take all your medications as prescribed by your mental healthcare provider. Report any adverse effects and or reactions from your medicines to your outpatient provider promptly. Patient is instructed and cautioned to not engage in alcohol and or illegal drug use while on prescription medicines. In the event of worsening symptoms, patient is instructed to call the crisis hotline, 911 and or go to the nearest ED for appropriate evaluation and treatment of symptoms. Follow-up with your primary care provider for your other medical issues, concerns and or health care needs.    Total Discharge Time:  Greater than 30 minutes.  Signed: Encarnacion Slates, PMHNP-BC 01/02/2014, 2:15 PM Personally evaluated the patient, and agree with assessment and Farrell Geralyn Flash A. Sabra Heck, M.D.

## 2014-01-01 NOTE — BHH Group Notes (Signed)
[facility] LCSW Group Therapy  01/01/2014 2:19 PM  Type of Therapy:  Group Therapy  Participation Level:  Active  Participation Quality:  Attentive and Sharing  Affect:  Appropriate  And tearful   Cognitive:  Alert and Oriented  Insight:  Developing/Improving  Engagement in Therapy:  Engaged  Modes of Intervention:  Discussion, Exploration and Support  Summary of Progress/Problems:  The purpose of this group is to assist patients in learning to regulate negative emotions and experience positive emotions. Patients will be guided to discuss ways in which they have been vulnerable to their negative emotions. These vulnerabilities will be juxtaposed with experiences of positive emotions or situations, and patients challenged to use positive emotions to combat negative ones.   Selena Farrell has been working all week to regulate the emotions of anxiety per her report as it relates to her admission, going back home alone and using substances as ways to cope. Today she was able to voice she is hopeful and excited about long term treatment as in her past she has always been in denial.  Selena Farrell reports she is very much out of control when it comes to her emotions and allows anxiety and depression to over take her and that is where her crisis begins. She is showing more investment into treatment and understanding the underlying mental health causes of why he uses substances.  Selena Farrell 01/01/2014, 2:19 PM

## 2014-01-01 NOTE — BHH Suicide Risk Assessment (Signed)
Suicide Risk Assessment  Discharge Assessment     Demographic Factors:  Caucasian  Total Time spent with patient: 45 minutes  Psychiatric Specialty Exam:     Blood pressure 109/75, pulse 69, temperature 97.8 F (36.6 C), temperature source Oral, resp. rate 20, height 5\' 4"  (1.626 m), weight 63.05 kg (139 lb), SpO2 98.00%.Body mass index is 23.85 kg/(m^2).  General Appearance: Fairly Groomed  Engineer, water::  Fair  Speech:  Clear and Coherent  Volume:  Normal  Mood:  Euthymic  Affect:  Appropriate  Thought Process:  Coherent and Goal Directed  Orientation:  Full (Time, Place, and Person)  Thought Content:  relapse prevention plan, pain management  Suicidal Thoughts:  No  Homicidal Thoughts:  No  Memory:  Immediate;   Fair Recent;   Fair Remote;   Fair  Judgement:  Fair  Insight:  Present  Psychomotor Activity:  Normal  Concentration:  Fair  Recall:  [employer] of Knowledge:NA  Language: Fair  Akathisia:  No  Handed:    AIMS (if indicated):     Assets:  Desire for Improvement  Sleep:  Number of Hours: 6.5    Musculoskeletal: Strength & Muscle Tone: within normal limits Gait & Station: normal Patient leans: N/A   Mental Status Per Nursing Assessment::   On Admission:  NA  Current Mental Status by Physician: In full contact with reality. There are no active S/S of withdrawal. Mood euthymic affect appropriate. In need of further pain management   Loss Factors: Decline in physical health  Historical Factors: NA  Risk Reduction Factors:   Sense of responsibility to family  Continued Clinical Symptoms:  Depression:   Comorbid alcohol abuse/dependence Alcohol/Substance Abuse/Dependencies  Cognitive Features That Contribute To Risk:  Polarized thinking Thought constriction (tunnel vision)    Suicide Risk:  Minimal: No identifiable suicidal ideation.  Patients presenting with no risk factors but with morbid ruminations; may be classified as minimal risk  based on the severity of the depressive symptoms  Discharge Diagnoses:   AXIS I:  Opioid Dependence, Major Depressive Disorder AXIS II:  Deferred AXIS III:   Past Medical History  Diagnosis Date  . Hypertension   . Gastritis 05/2011    EGD-Dr. Hilarie Fredrickson   . Gastric ulcer 05/2011    EGD-Dr. Hilarie Fredrickson   . Depression   . Nicotine dependence   . Alcohol dependence     Quit Sept. 2012   . Helicobacter pylori (H. pylori) infection     Hx of   . External hemorrhoid   . Delayed gastric emptying   . Choledocholithiasis 06/23/2011    ERCP, shincterotomy and baloon stone extraction by Dr Fuller Plan.   . Bronchitis   . Cough 07/29/2013  . Hyponatremia   . Tenosynovitis     right MC joint   AXIS IV:  other psychosocial or environmental problems AXIS V:  61-70 mild symptoms  Plan Of Care/Follow-up recommendations:  Activity:  as tolerated Diet:  regular Follow up [facility] Is patient on multiple antipsychotic therapies at discharge:  No   Has Patient had three or more failed trials of antipsychotic monotherapy by history:  No  Recommended Plan for Multiple Antipsychotic Therapies: NA    Nicholaus Bloom 01/01/2014, 6:10 PM

## 2014-01-01 NOTE — Progress Notes (Signed)
Patient ID: Selena Farrell, female   DOB: [birth year], 53 y.o.   MRN: [id number] Patient ID: Selena Farrell, female   DOB: 1961/08/05, 53 y.o.   MRN: [id number] [facility] MD Progress Note  01/01/2014 2:32 PM Selena Farrell  MRN:  [id number]  Subjective: Selena Farrell says, "I learnt today that I will be going into rehab in the morning. The place is called [facility]. I know that is what I need. I don't think that I am ready to go home on my own yet. I have been worrying myself on what I would have done if I don't get into any rehab. I praise the Lord. My mood is better now"  Diagnosis:   DSM5: Schizophrenia Disorders:  none Obsessive-Compulsive Disorders:  none Trauma-Stressor Disorders:  none Substance/Addictive Disorders:  Opioid Disorder - Moderate (304.00) Depressive Disorders:  Major Depressive Disorder - Severe (296.23) Total Time spent with patient: 30 minutes  Axis I: Generalized Anxiety Disorder  ADL's:  Intact  Sleep: Fair  Appetite:  Fair  Suicidal Ideation:  Plan:  denies Intent:  denies Means:  denies Homicidal Ideation:  Plan:  denies Intent:  denies Means:  denies AEB (as evidenced by):  Psychiatric Specialty Exam: Physical Exam  Review of Systems  Constitutional: Positive for malaise/fatigue.  HENT: Negative.   Eyes: Negative.   Respiratory: Negative.   Cardiovascular: Negative.   Gastrointestinal: Positive for nausea, abdominal pain and constipation.  Genitourinary: Negative.   Musculoskeletal: Negative.   Skin: Negative.   Neurological: Positive for weakness.  Endo/Heme/Allergies: Negative.   Psychiatric/Behavioral: Positive for depression and substance abuse. The patient is nervous/anxious and has insomnia.     Blood pressure 109/75, pulse 69, temperature 97.8 F (36.6 C), temperature source Oral, resp. rate 20, height 5\' 4"  (1.626 m), weight 63.05 kg (139 lb), SpO2 98.00%.Body mass index is 23.85 kg/(m^2).  General Appearance: Fairly Groomed  Chemical engineer::  Fair  Speech:  Clear and Coherent  Volume:  Decreased  Mood:  Anxious and Depressed  Affect:  anxious, depressed, worried  Thought Process:  Coherent and Goal Directed  Orientation:  Full (Time, Place, and Person)  Thought Content:  symtpoms, worries, concerns  Suicidal Thoughts:  No  Homicidal Thoughts:  No  Memory:  Immediate;   Fair Recent;   Fair Remote;   Fair  Judgement:  Fair  Insight:  Present  Psychomotor Activity:  Restlessness  Concentration:  Fair  Recall:  [employer] of Knowledge:NA  Language: Fair  Akathisia:  No  Handed:    AIMS (if indicated):     Assets:  Desire for Improvement  Sleep:  Number of Hours: 6.5   Musculoskeletal: Strength & Muscle Tone: within normal limits Gait & Station: normal Patient leans: N/A  Current Medications: Current Facility-Administered Medications  Medication Dose Route Frequency Provider Last Rate Last Dose  . acetaminophen (TYLENOL) tablet 1,000 mg  1,000 mg Oral Q6H PRN Encarnacion Slates, NP   1,000 mg at 01/01/14 0841  . ARIPiprazole (ABILIFY) tablet 2 mg  2 mg Oral Daily Nicholaus Bloom, MD   2 mg at 01/01/14 2774  . atenolol (TENORMIN) tablet 25 mg  25 mg Oral Daily Shuvon Rankin, NP   25 mg at 01/01/14 0807  . citalopram (CELEXA) tablet 40 mg  40 mg Oral Daily Shuvon Rankin, NP   40 mg at 01/01/14 0807  . cloNIDine (CATAPRES) tablet 0.1 mg  0.1 mg Oral QAC breakfast Encarnacion Slates, NP   0.1 mg  at 01/01/14 0807  . dicyclomine (BENTYL) tablet 20 mg  20 mg Oral Q6H PRN Encarnacion Slates, NP   20 mg at 12/31/13 1946  . docusate sodium (COLACE) capsule 100 mg  100 mg Oral BID Nicholaus Bloom, MD   100 mg at 01/01/14 4098  . feeding supplement (ENSURE COMPLETE) (ENSURE COMPLETE) liquid 237 mL  237 mL Oral BID BM Darrol Jump, RD   237 mL at 01/01/14 1304  . gabapentin (NEURONTIN) capsule 300 mg  300 mg Oral TID Nicholaus Bloom, MD   300 mg at 01/01/14 1156  . hydrOXYzine (ATARAX/VISTARIL) tablet 25 mg  25 mg Oral Q6H PRN Encarnacion Slates, NP   25 mg at 12/31/13 2110  . lidocaine (LIDODERM) 5 % 1 patch  1 patch Transdermal Daily Nicholaus Bloom, MD   1 patch at 01/01/14 [phone]  . loperamide (IMODIUM) capsule 2-4 mg  2-4 mg Oral PRN Encarnacion Slates, NP      . magnesium hydroxide (MILK OF MAGNESIA) suspension 30 mL  30 mL Oral Daily PRN Shuvon Rankin, NP   30 mL at 12/29/13 1641  . methocarbamol (ROBAXIN) tablet 500 mg  500 mg Oral Q8H PRN Encarnacion Slates, NP   500 mg at 01/01/14 0841  . metoCLOPramide (REGLAN) tablet 10 mg  10 mg Oral QHS Nicholaus Bloom, MD   10 mg at 12/31/13 2110  . metoCLOPramide (REGLAN) tablet 5 mg  5 mg Oral TID AC Nicholaus Bloom, MD   5 mg at 01/01/14 1155  . nicotine (NICODERM CQ - dosed in mg/24 hours) patch 21 mg  21 mg Transdermal Daily Shuvon Rankin, NP   21 mg at 01/01/14 0810  . ondansetron (ZOFRAN-ODT) disintegrating tablet 4 mg  4 mg Oral Q6H PRN Encarnacion Slates, NP   4 mg at 01/01/14 1348  . pantoprazole (PROTONIX) EC tablet 40 mg  40 mg Oral Daily Shuvon Rankin, NP   40 mg at 01/01/14 0807  . pramoxine-mineral oil-zinc (TUCKS) rectal ointment   Rectal TID PRN Laverle Hobby, PA-C   1 application at 47/82/95 2113  . temazepam (RESTORIL) capsule 30 mg  30 mg Oral QHS PRN Shuvon Rankin, NP   30 mg at 12/31/13 2110    Lab Results: No results found for this or any previous visit (from the past 48 hour(s)).  Physical Findings: AIMS: Facial and Oral Movements Muscles of Facial Expression: None, normal Lips and Perioral Area: None, normal Jaw: None, normal Tongue: None, normal,Extremity Movements Upper (arms, wrists, hands, fingers): None, normal Lower (legs, knees, ankles, toes): None, normal, Trunk Movements Neck, shoulders, hips: None, normal, Overall Severity Severity of abnormal movements (highest score from questions above): None, normal Incapacitation due to abnormal movements: None, normal Patient's awareness of abnormal movements (rate only patient's report): No Awareness, Dental  Status Current problems with teeth and/or dentures?: No Does patient usually wear dentures?: No  CIWA:  CIWA-Ar Total: 2 COWS:  COWS Total Score: 4  Treatment Plan Summary: Daily contact with patient to assess and evaluate symptoms and progress in treatment Medication management  Plan: Supportive approach/coping skills/relapse prevention CBT;mindfulness. Will discharge to [facility] in am.  Medical Decision Making Problem Points:  Review of psycho-social stressors (1) Data Points:  Review of medication regiment & side effects (2) Review of new medications or change in dosage (2)  I certify that inpatient services furnished can reasonably be expected to improve the patient's condition.   Herbert Pun  Thane Edu, PMHNP-BC 01/01/2014, 2:32 PM Personally evaluated the patient and agree with assessment and plan Geralyn Flash A. Sabra Heck, M.D.

## 2014-01-02 NOTE — Progress Notes (Signed)
Discharge Note: Patient appeared bright on approach this morning. She denied SI/HI and denied hallucinations. Her mood and affect appropriate.  Writer reviewed discharge insructions with patient. She verbalized understanding of the instructions. Discharged to [facility] as ordered. She is waiting for her daughter to pick her up at the lobby.

## 2014-01-06 NOTE — Progress Notes (Addendum)
4/29: Patient still requesting inpatient for SA.  Called [facility] and asked LCSW to fax referral information for review. Possible bed opening on Monday 01/13/14.  LCSW faxed requested information and patient encouraged to call if other needs arise.  Patient reports she is still going to [employer], maintaining sobriety, however needing inpatient for long term SA treatment.  LCSW continues to follow in efforts to prevent rehospitalization.  4/28:  Patient called LCSW back regarding referral to [facility]. Patient was accepted to [facility] however due to money needed for up front costs, patient declined and reported she was unable to go due to not being able to pay. LCSW gave patient three additional options for outpatient in which patient will be calling:  [facility], [facility] and [facility], all for SA IOP groups.  Patient was encouraged to call back if referral information was needed.  LCSW will follow as needed to help establish care for patient and prevent readmission into hospital.  LCSW received call from patient who left treatment due to insurance. Patient was looking to readmit back to ED and get into rehab, however LCSW and patient partnered to call [facility] [place] to get patient referred. Patient advocated for herself, called the facility and currently working on getting admitted to inpatient for treatment: Substance Abuse.  LCSW was asked by admission to fax DC summary, face sheet, and H&P.  LCSW completed the rest of referral for patient and faxed requested information.  Patient encouraged to call back if needing additional assistance with referral.  Caleen Essex, MSW, [place] Clinical Lead [phone]

## 2014-01-06 NOTE — Progress Notes (Signed)
Patient Discharge Instructions:  After Visit Summary (AVS):   Faxed to:  01/06/14 Discharge Summary Note:   Faxed to:  01/06/14 Psychiatric Admission Assessment Note:   Faxed to:  01/06/14 Suicide Risk Assessment - Discharge Assessment:   Faxed to:  01/06/14 Faxed/Sent to the Next Level Care provider:  01/06/14 Faxed to [facility] @ [phone] Faxed to [facility] [place], 01/06/2014, 4:02 PM

## 2014-02-25 IMAGING — RF DG MYELOGRAM CERVICAL
9 series · 9 of 9 positions shown · non-contrast
Comparison: none

CLINICAL DATA: Right neck pain
TECHNIQUE: Contiguous axial images were obtained through the Cervical spine
after the intrathecal infusion of infusion. Coronal and sagittal
reconstructions were obtained of the axial image sets.

[Series 1: (hospital) · 1 of 1 slices shown]
[im 1/1]
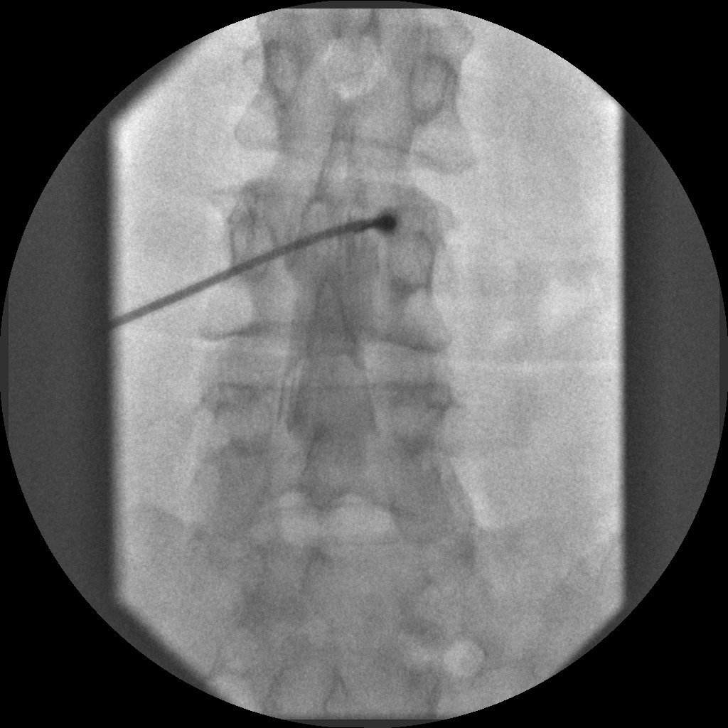

[Series 2: myelogram  white · 1 of 1 slices shown (1 of 8)]
[im 1/1]
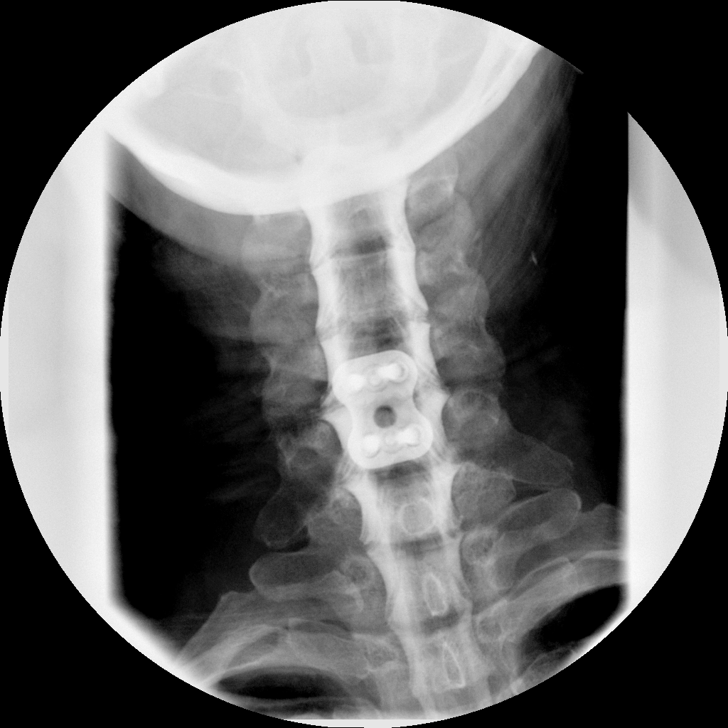

[Series 3: myelogram  white · 1 of 1 slices shown (2 of 8)]
[im 1/1]
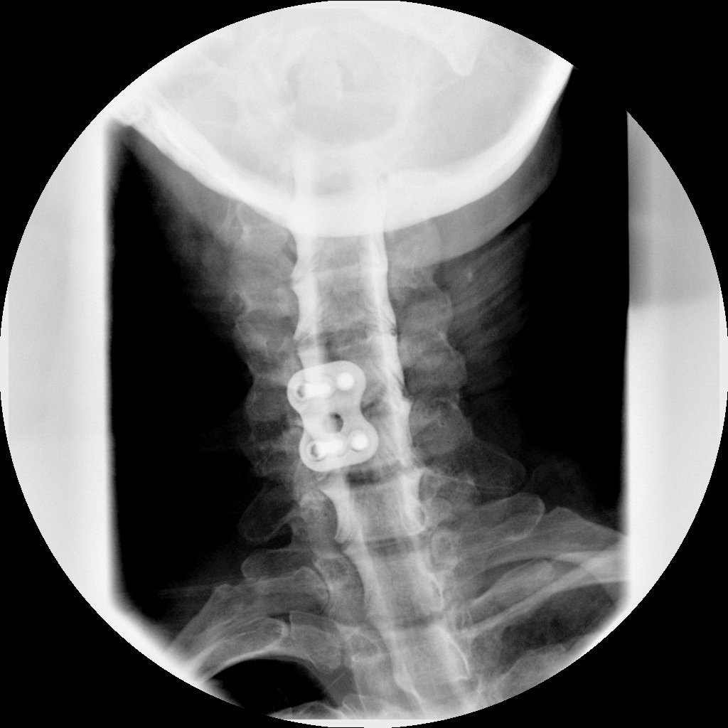

[Series 4: myelogram  white · 1 of 1 slices shown (3 of 8)]
[im 1/1]
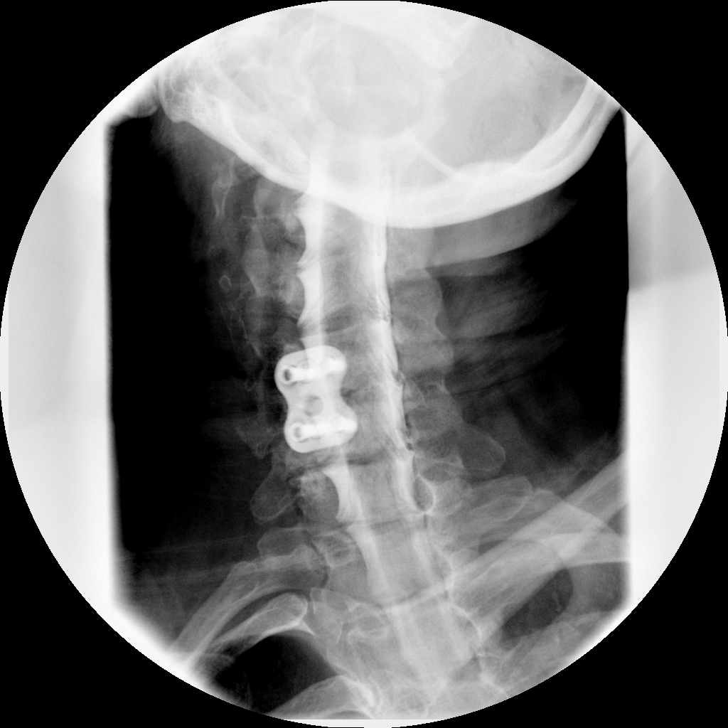

[Series 5: myelogram  white · 1 of 1 slices shown (4 of 8)]
[im 1/1]
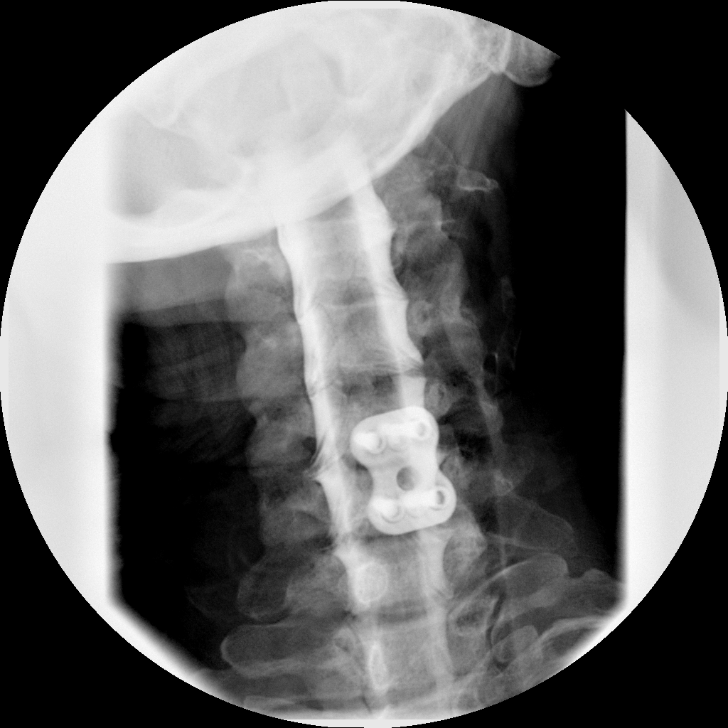

[Series 6: myelogram  white · 1 of 1 slices shown (5 of 8)]
[im 1/1]
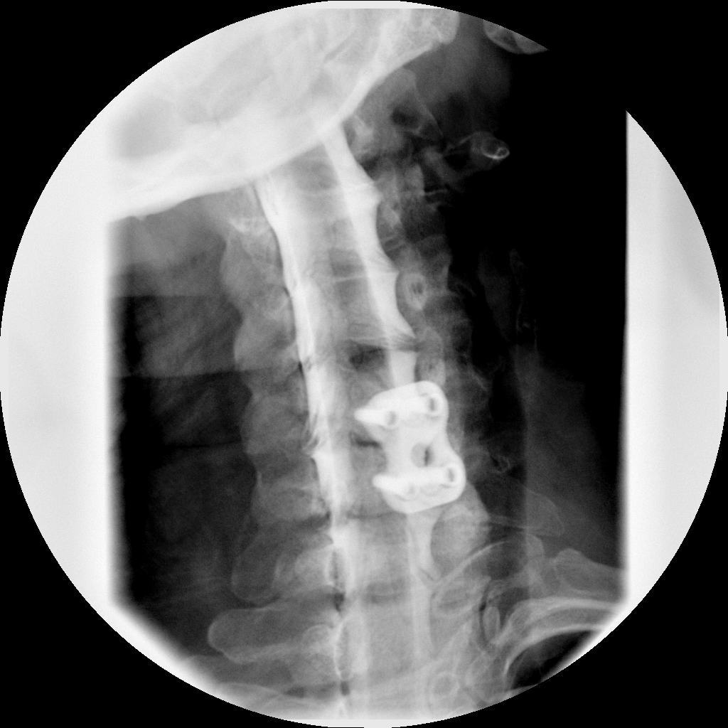

[Series 7: myelogram  white · 1 of 1 slices shown (6 of 8)]
[im 1/1]
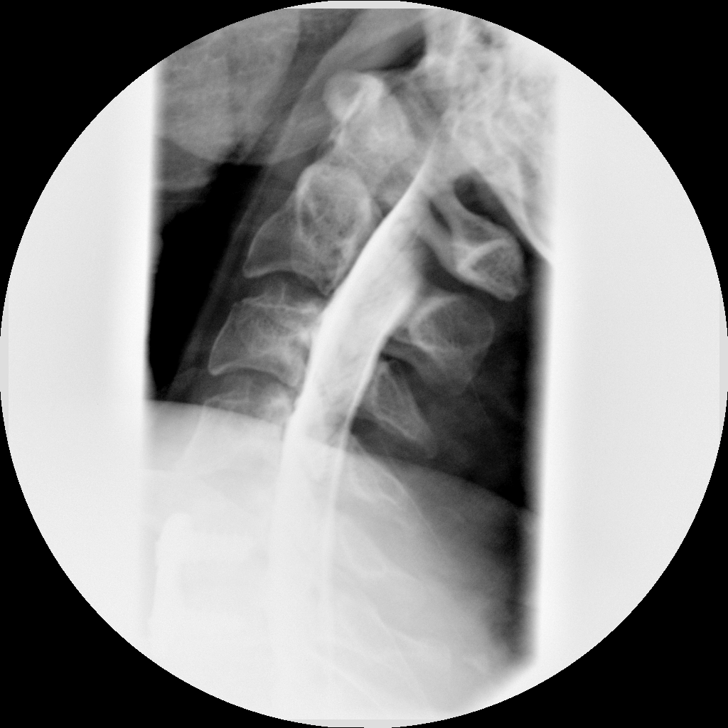

[Series 8: myelogram  white · 1 of 1 slices shown (7 of 8)]
[im 1/1]
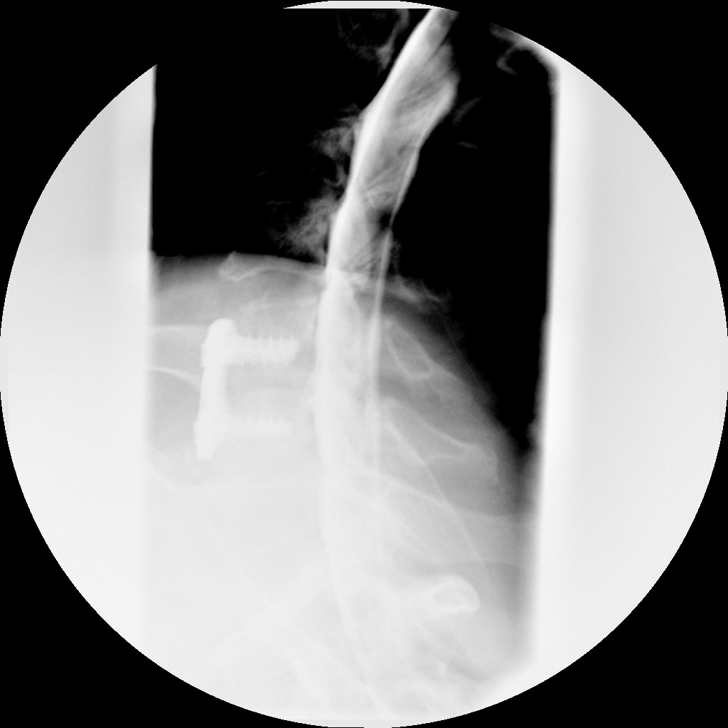

[Series 9: myelogram  white · 1 of 1 slices shown (8 of 8)]
[im 1/1]
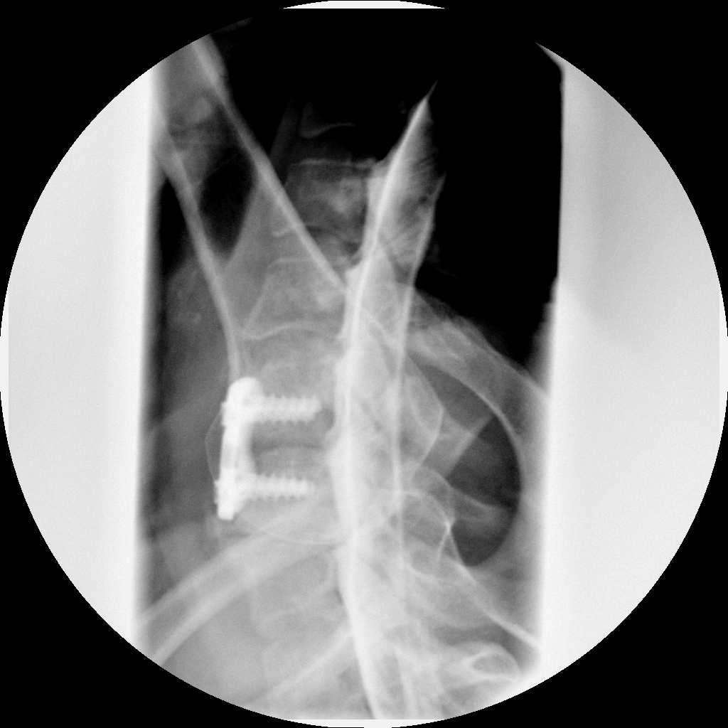

[9 of 9 positions shown; findings below may reference images not displayed]

FLUOROSCOPY TIME:  1 min and 6 seconds.

PROCEDURE:
LUMBAR PUNCTURE FOR CERVICAL MYELOGRAM

After thorough discussion of risks and benefits of the procedure
including bleeding, infection, injury to nerves, blood vessels,
adjacent structures as well as headache and CSF leak, written and
oral informed consent was obtained. Consent was obtained by Dr. Dade
Blain. We discussed the high likelihood of obtaining a diagnostic
study.

Patient was positioned prone on the fluoroscopy table. Local
anesthesia was provided with 1% lidocaine without epinephrine after
prepped and draped in the usual sterile fashion. Puncture was
performed at L2-3 using a 3 1/2 inch 22-gauge spinal needle via
right paramedian approach. Using a single pass through the dura, the
needle was placed within the thecal sac, with return of clear CSF.
10 mL of Tmnipaque-TMM was injected into the thecal sac, with normal
opacification of the nerve roots and cauda equina consistent with
free flow within the subarachnoid space. The patient was then moved
to the trendelenburg position and contrast flowed into the Cervical
spine region.

I personally performed the lumbar puncture and administered the
intrathecal contrast. I also personally supervised acquisition of
the myelogram images.
FINDINGS: CERVICAL MYELOGRAM FINDINGS:

Anterior plate and screws at C5-6. Anatomic alignment. No breakage
or loosening of the hardware. Nerve root sleeves are grossly within
normal limits.
IMPRESSION: C5-6 fusion without complication or obvious impingement. CT imaging
to follow.

## 2014-03-30 ENCOUNTER — Encounter (HOSPITAL_COMMUNITY): Payer: Self-pay | Admitting: Emergency Medicine

## 2014-03-30 ENCOUNTER — Emergency Department (HOSPITAL_COMMUNITY): Payer: BC Managed Care – PPO

## 2014-03-30 ENCOUNTER — Emergency Department (HOSPITAL_COMMUNITY)
Admission: EM | Admit: 2014-03-30 | Discharge: 2014-03-31 | Disposition: A | Discharge status: Home / Self Care | Payer: BC Managed Care – PPO | Attending: Emergency Medicine | Admitting: Emergency Medicine

## 2014-03-30 DIAGNOSIS — F17200 Nicotine dependence, unspecified, uncomplicated: Secondary | ICD-10-CM | POA: Insufficient documentation

## 2014-03-30 DIAGNOSIS — X500XXA Overexertion from strenuous movement or load, initial encounter: Secondary | ICD-10-CM | POA: Insufficient documentation

## 2014-03-30 DIAGNOSIS — F329 Major depressive disorder, single episode, unspecified: Secondary | ICD-10-CM | POA: Insufficient documentation

## 2014-03-30 DIAGNOSIS — Y92838 Other recreation area as the place of occurrence of the external cause: Secondary | ICD-10-CM

## 2014-03-30 DIAGNOSIS — Z8739 Personal history of other diseases of the musculoskeletal system and connective tissue: Secondary | ICD-10-CM | POA: Insufficient documentation

## 2014-03-30 DIAGNOSIS — Z8639 Personal history of other endocrine, nutritional and metabolic disease: Secondary | ICD-10-CM | POA: Insufficient documentation

## 2014-03-30 DIAGNOSIS — Z8709 Personal history of other diseases of the respiratory system: Secondary | ICD-10-CM | POA: Insufficient documentation

## 2014-03-30 DIAGNOSIS — Z8619 Personal history of other infectious and parasitic diseases: Secondary | ICD-10-CM | POA: Insufficient documentation

## 2014-03-30 DIAGNOSIS — F3289 Other specified depressive episodes: Secondary | ICD-10-CM | POA: Insufficient documentation

## 2014-03-30 DIAGNOSIS — S93402A Sprain of unspecified ligament of left ankle, initial encounter: Secondary | ICD-10-CM

## 2014-03-30 DIAGNOSIS — Y9239 Other specified sports and athletic area as the place of occurrence of the external cause: Secondary | ICD-10-CM | POA: Insufficient documentation

## 2014-03-30 DIAGNOSIS — S93409A Sprain of unspecified ligament of unspecified ankle, initial encounter: Secondary | ICD-10-CM | POA: Insufficient documentation

## 2014-03-30 DIAGNOSIS — Z79899 Other long term (current) drug therapy: Secondary | ICD-10-CM | POA: Insufficient documentation

## 2014-03-30 DIAGNOSIS — Y9389 Activity, other specified: Secondary | ICD-10-CM | POA: Insufficient documentation

## 2014-03-30 DIAGNOSIS — K2990 Gastroduodenitis, unspecified, without bleeding: Secondary | ICD-10-CM

## 2014-03-30 DIAGNOSIS — Z862 Personal history of diseases of the blood and blood-forming organs and certain disorders involving the immune mechanism: Secondary | ICD-10-CM | POA: Insufficient documentation

## 2014-03-30 DIAGNOSIS — K259 Gastric ulcer, unspecified as acute or chronic, without hemorrhage or perforation: Secondary | ICD-10-CM | POA: Insufficient documentation

## 2014-03-30 DIAGNOSIS — I10 Essential (primary) hypertension: Secondary | ICD-10-CM | POA: Insufficient documentation

## 2014-03-30 DIAGNOSIS — K2970 Gastritis, unspecified, without bleeding: Secondary | ICD-10-CM | POA: Insufficient documentation

## 2014-03-30 NOTE — ED Notes (Signed)
The pt is c/o lt foot and ankle pain.  She twisted her lt ankle while playing badmitton earlier today.  She heard a crack.  The ankle is swollen medially and laterally and she has pain in her lat foot

## 2014-03-30 NOTE — ED Provider Notes (Signed)
CSN: [id number]     Arrival date & time 03/30/14  2308 History   First MD Initiated Contact with Patient 03/30/14 2318     Chief Complaint  Patient presents with  . Ankle Injury     (Consider location/radiation/quality/duration/timing/severity/associated sxs/prior Treatment) HPI Comments: 53 year old female presents to the emergency department complaining of left ankle and foot pain x3 hours. Patient states she was playing badminton earlier today when she twisted her left ankle and heard a "crack". States she started having swelling shortly after and severe sharp pains. Pain currently rated 7/10, worse with pressure and movement. She has not tried any alleviating factors for her symptoms. Denies numbness or tingling.  Patient is a 53 y.o. female presenting with lower extremity injury. The history is provided by the patient.  Ankle Injury    Past Medical History  Diagnosis Date  . Hypertension   . Gastritis 05/2011    EGD-Dr. Hilarie Fredrickson   . Gastric ulcer 05/2011    EGD-Dr. Hilarie Fredrickson   . Depression   . Nicotine dependence   . Alcohol dependence     Quit Sept. 2012   . Helicobacter pylori (H. pylori) infection     Hx of   . External hemorrhoid   . Delayed gastric emptying   . Choledocholithiasis 06/23/2011    ERCP, shincterotomy and baloon stone extraction by Dr Fuller Plan.   . Bronchitis   . Cough 07/29/2013  . Hyponatremia   . Tenosynovitis     right MC joint   Past Surgical History  Procedure Laterality Date  . Appendectomy    . Cesarean section      x1  . Esophagogastroduodenoscopy N/A 11/28/2012    Procedure: ESOPHAGOGASTRODUODENOSCOPY (EGD);  Surgeon: Jerene Bears, MD;  Location: [facility];  Service: Gastroenterology;  Laterality: N/A;  . Cholecystectomy N/A 11/29/2012    Procedure: LAPAROSCOPIC CHOLECYSTECTOMY WITH INTRAOPERATIVE CHOLANGIOGRAM;  Surgeon: Earnstine Regal, MD;  Location: [facility];  Service: General;  Laterality: N/A;  . Cervical neck fusion  03/2004    C5-C6  anterior discectomy and fusion with allograft  . Ercp w/ sphicterotomy  06/23/2011    ERCP, shincterotomy and baloon stone extraction by Dr Fuller Plan. 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    Family History  Problem Relation Age of Onset  . Colon cancer Neg Hx   . Alcohol abuse Mother   . Stroke Father   . Cancer Maternal Grandfather     prostate  . Aneurysm Sister    History  Substance Use Topics  . Smoking status: Current Every Day Smoker -- 0.50 packs/day for 25 years    Types: Cigarettes  . Smokeless tobacco: Never Used     Comment: Counseling to quit smoking given to patient in exam room   . Alcohol Use: No     Comment: Heavy drinker quit  Sept 2012    OB History   Grav Para Term Preterm Abortions TAB SAB Ect Mult Living                 Review of Systems  Musculoskeletal:       + L ankle and foot pain and swelling.  All other systems reviewed and are negative.     Allergies  Aspirin; Celebrex; Nsaids; and Vicodin  Home Medications   Prior to Admission medications   Medication Sig Start Date End Date Taking? Authorizing Provider  ARIPiprazole (ABILIFY) 2 MG tablet Take 1 tablet (2 mg total) by mouth daily. For mood control 01/01/14  Encarnacion Slates, NP  atenolol (TENORMIN) 25 MG tablet Take 1 tablet (25 mg total) by mouth daily. For High blood pressure 01/01/14   Encarnacion Slates, NP  citalopram (CELEXA) 40 MG tablet Take 1 tablet (40 mg total) by mouth daily. For depression 01/01/14   Encarnacion Slates, NP  docusate sodium (COLACE) 100 MG capsule Take 1 capsule (100 mg total) by mouth daily as needed for mild constipation. 01/01/14   Encarnacion Slates, NP  gabapentin (NEURONTIN) 300 MG capsule Take 1 capsule (300 mg total) by mouth 3 (three) times daily. For substance withdrawal syndrome/pain management 01/01/14   Encarnacion Slates, NP  hydrOXYzine (ATARAX/VISTARIL) 25 MG tablet Take 25 mg (1 tablet) three times daily as needed for anxiety 01/01/14   Encarnacion Slates, NP   lidocaine (LIDODERM) 5 % Place 1 patch onto the skin daily. Remove & Discard patch within 12 hours or as directed by MD: For pain 01/01/14   Encarnacion Slates, NP  metoCLOPramide (REGLAN) 5 MG tablet Take 1 tablet (5 mg) three times daily & 2 tablets (10 mg) at bedtime: Acid reflux 01/01/14   Encarnacion Slates, NP  nicotine (NICODERM CQ - DOSED IN MG/24 HOURS) 21 mg/24hr patch Place 1 patch (21 mg total) onto the skin daily. For Nicotine addiction 01/01/14   Encarnacion Slates, NP  omeprazole (PRILOSEC) 20 MG capsule Take 2 capsules (40 mg total) by mouth daily. For acid reflux 01/01/14   Encarnacion Slates, NP  temazepam (RESTORIL) 30 MG capsule Take 1 capsule (30 mg total) by mouth at bedtime as needed for sleep. 01/01/14   Encarnacion Slates, NP   BP 124/80  Pulse 75  Temp(Src) 98.3 F (36.8 C) (Oral)  Resp 18  Ht 5\' 4"  (1.626 m)  Wt 153 lb (69.4 kg)  BMI 26.25 kg/m2  SpO2 98% Physical Exam  Nursing note and vitals reviewed. Constitutional: She is oriented to person, place, and time. She appears well-developed and well-nourished. No distress.  HENT:  Head: Normocephalic and atraumatic.  Mouth/Throat: Oropharynx is clear and moist.  Eyes: Conjunctivae and EOM are normal.  Neck: Normal range of motion. Neck supple.  Cardiovascular: Normal rate, regular rhythm and normal heart sounds.   +2 PT/DP pulse on left. Capillary refill less than 3 seconds.  Pulmonary/Chest: Effort normal and breath sounds normal. No respiratory distress.  Musculoskeletal: Normal range of motion. She exhibits no edema.  Tender to palpation both medial and lateral malleolus, worse laterally. Mild swelling noted laterally. Tenderness to palpation over base of fifth metatarsal. No overlying swelling or bruising. Range of motion limited by pain. Able to wiggle her toes without difficulty. No tenderness to proximal fibula.  Neurological: She is alert and oriented to person, place, and time. No sensory deficit.  Sensation intact.  Skin: Skin  is warm and dry.  Psychiatric: She has a normal mood and affect. Her behavior is normal.    ED Course  Procedures (including critical care time) Labs Review Labs Reviewed - No data to display  Imaging Review Dg Ankle Complete Left  03/30/2014   CLINICAL DATA:  Left ankle injury.  EXAM: LEFT ANKLE COMPLETE - 3+ VIEW  COMPARISON:  Left foot radiographs from 07/06/2013  FINDINGS: There is no evidence of fracture or dislocation. The ankle mortise is intact; the interosseous space is within normal limits. No talar tilt or subluxation is seen.  The joint spaces are preserved. No significant soft tissue abnormalities are seen.  IMPRESSION:  No evidence of fracture or dislocation.   Electronically Signed   By: Garald Balding M.D.   On: 03/30/2014 23:49   Dg Foot Complete Left  03/30/2014   CLINICAL DATA:  Fall, left lateral ankle and foot swelling.  EXAM: LEFT FOOT - COMPLETE 3+ VIEW  COMPARISON:  None.  FINDINGS: No acute fracture or dislocation. Joint spaces are maintained. Mild degenerative osteoarthritic changes noted about the first IP joint. Base of fifth metatarsal is intact.  No soft tissue abnormality.  IMPRESSION: No acute fracture or dislocation.   Electronically Signed   By: Jeannine Boga M.D.   On: 03/30/2014 23:52     EKG Interpretation None      MDM   Final diagnoses:  Left ankle sprain, initial encounter    Neurovascularly intact. X-ray without any acute findings. Patient in no apparent distress. Vital signs stable. Ace wrap applied. Discussed RICE. Stable for discharge. Return precautions given. Patient states understanding of treatment care plan and is agreeable.   Illene Labrador, PA-C 03/31/14 0000

## 2014-03-30 NOTE — Discharge Instructions (Signed)
°Ankle Sprain °An ankle sprain is an injury to the strong, fibrous tissues (ligaments) that hold the bones of your ankle joint together.  °CAUSES °An ankle sprain is usually caused by a fall or by twisting your ankle. Ankle sprains most commonly occur when you step on the outer edge of your foot, and your ankle turns inward. People who participate in sports are more prone to these types of injuries.  °SYMPTOMS  °· Pain in your ankle. The pain may be present at rest or only when you are trying to stand or walk. °· Swelling. °· Bruising. Bruising may develop immediately or within 1 to 2 days after your injury. °· Difficulty standing or walking, particularly when turning corners or changing directions. °DIAGNOSIS  °Your caregiver will ask you details about your injury and perform a physical exam of your ankle to determine if you have an ankle sprain. During the physical exam, your caregiver will press on and apply pressure to specific areas of your foot and ankle. Your caregiver will try to move your ankle in certain ways. An X-ray exam may be done to be sure a bone was not broken or a ligament did not separate from one of the bones in your ankle (avulsion fracture).  °TREATMENT  °Certain types of braces can help stabilize your ankle. Your caregiver can make a recommendation for this. Your caregiver may recommend the use of medicine for pain. If your sprain is severe, your caregiver may refer you to a surgeon who helps to restore function to parts of your skeletal system (orthopedist) or a physical therapist. °HOME CARE INSTRUCTIONS  °· Apply ice to your injury for 1-2 days or as directed by your caregiver. Applying ice helps to reduce inflammation and pain. °¨ Put ice in a plastic bag. °¨ Place a towel between your skin and the bag. °¨ Leave the ice on for 15-20 minutes at a time, every 2 hours while you are awake. °· Only take over-the-counter or prescription medicines for pain, discomfort, or fever as directed by  your caregiver. °· Elevate your injured ankle above the level of your heart as much as possible for 2-3 days. °· If your caregiver recommends crutches, use them as instructed. Gradually put weight on the affected ankle. Continue to use crutches or a cane until you can walk without feeling pain in your ankle. °· If you have a plaster splint, wear the splint as directed by your caregiver. Do not rest it on anything harder than a pillow for the first 24 hours. Do not put weight on it. Do not get it wet. You may take it off to take a shower or bath. °· You may have been given an elastic bandage to wear around your ankle to provide support. If the elastic bandage is too tight (you have numbness or tingling in your foot or your foot becomes cold and blue), adjust the bandage to make it comfortable. °· If you have an air splint, you may blow more air into it or let air out to make it more comfortable. You may take your splint off at night and before taking a shower or bath. Wiggle your toes in the splint several times per day to decrease swelling. °SEEK MEDICAL CARE IF:  °· You have rapidly increasing bruising or swelling. °· Your toes feel extremely cold or you lose feeling in your foot. °· Your pain is not relieved with medicine. °SEEK IMMEDIATE MEDICAL CARE IF: °· Your toes are numb or blue. °·   You have severe pain that is increasing. °MAKE SURE YOU:  °· Understand these instructions. °· Will watch your condition. °· Will get help right away if you are not doing well or get worse. °Document Released: 08/29/2005 Document Revised: 05/23/2012 Document Reviewed: 09/10/2011 °ExitCare® Patient Information ©2015 [employer]. This information is not intended to replace advice given to you by your health care provider. Make sure you discuss any questions you have with your health care provider. ° ° ° °RICE: Routine Care for Injuries °The routine care of many injuries includes Rest, Ice, Compression, and Elevation (RICE). °HOME  CARE INSTRUCTIONS °· Rest is needed to allow your body to heal. Routine activities can usually be resumed when comfortable. Injured tendons and bones can take up to 6 weeks to heal. Tendons are the cord-like structures that attach muscle to bone. °· Ice following an injury helps keep the swelling down and reduces pain. °¨ Put ice in a plastic bag. °¨ Place a towel between your skin and the bag. °¨ Leave the ice on for 15-20 minutes, 3-4 times a day, or as directed by your health care provider. Do this while awake, for the first 24 to 48 hours. After that, continue as directed by your caregiver. °· Compression helps keep swelling down. It also gives support and helps with discomfort. If an elastic bandage has been applied, it should be removed and reapplied every 3 to 4 hours. It should not be applied tightly, but firmly enough to keep swelling down. Watch fingers or toes for swelling, bluish discoloration, coldness, numbness, or excessive pain. If any of these problems occur, remove the bandage and reapply loosely. Contact your caregiver if these problems continue. °· Elevation helps reduce swelling and decreases pain. With extremities, such as the arms, hands, legs, and feet, the injured area should be placed near or above the level of the heart, if possible. °SEEK IMMEDIATE MEDICAL CARE IF: °· You have persistent pain and swelling. °· You develop redness, numbness, or unexpected weakness. °· Your symptoms are getting worse rather than improving after several days. °These symptoms may indicate that further evaluation or further X-rays are needed. Sometimes, X-rays may not show a small broken bone (fracture) until 1 week or 10 days later. Make a follow-up appointment with your caregiver. Ask when your X-ray results will be ready. Make sure you get your X-ray results. °Document Released: 12/11/2000 Document Revised: 09/03/2013 Document Reviewed: 01/28/2011 °ExitCare® Patient Information ©2015 [employer]. This  information is not intended to replace advice given to you by your health care provider. Make sure you discuss any questions you have with your health care provider. ° °

## 2014-03-31 NOTE — ED Notes (Signed)
Pt was wheeled out and assisted to car. Discharge instructions also reviewed with pt.

## 2014-03-31 NOTE — ED Notes (Signed)
Pt reports pain and swelling to left ankle. Pt was playing badmitton and she heard a pop in ankle. Pt rates pain 7/10.  Swelling noted to foot.

## 2014-04-03 NOTE — ED Provider Notes (Signed)
Medical screening examination/treatment/procedure(s) were performed by non-physician practitioner and as supervising physician I was immediately available for consultation/collaboration.   EKG Interpretation None        Elyn Peers, MD 04/03/14 [phone]

## 2014-04-10 ENCOUNTER — Other Ambulatory Visit: Payer: Self-pay | Admitting: Internal Medicine

## 2014-04-11 NOTE — Telephone Encounter (Signed)
Patient's pantoprazole originally rx'ed by Dr Hilarie Fredrickson was changed to omeprazole while she was in the hospital (last rx given 12/2013). However, when I try to refill omeprazole, I get the following note: "Plasma concentrations and toxic effects of citalopram may be increased by concomitant administration of omeprazole. Specifically, citalopram doses greater than 20 mg/day are not recommended in patients receiving omeprazole according to official package labeling due to the risk of QT prolongation." Am I okay to refill omeprazole or do I need to have her switch back to pantoprazole?

## 2014-04-14 ENCOUNTER — Telehealth: Payer: Self-pay | Admitting: *Deleted

## 2014-04-14 NOTE — Telephone Encounter (Signed)
Willia Craze, NP Hulan Saas, RN            Rollene Fare,  I got a notice from pharmacy that patient now on Seroquel. We have her on Reglan and the combination can increase risk of tardive dyskinesia and similar problems. Please help her get changed to Domperidone. Prior to starting she will need baseline EKG and then EKG Q 6 months while on Domperidone. Thanks, Nevin Bloodgood           Left a message for patient to call me to clarify which medications patient is currently taking.?Seroquel or Abilify?

## 2014-04-14 NOTE — Telephone Encounter (Signed)
Spoke with patient and she states she is not on Seroquel or Abilify. She states she told them she had to have the Reglan and they took her off of both drugs.

## 2014-04-17 NOTE — Telephone Encounter (Signed)
Noted  

## 2014-04-23 ENCOUNTER — Telehealth: Payer: Self-pay | Admitting: *Deleted

## 2014-04-23 NOTE — Telephone Encounter (Signed)
[facility] has approved patient's omeprazole from 03/24/14-04/23/15. PA# is [id number].

## 2014-05-10 ENCOUNTER — Other Ambulatory Visit: Payer: Self-pay | Admitting: Internal Medicine

## 2014-05-13 NOTE — Telephone Encounter (Signed)
Message copied by Larina Bras on Tue May 13, 2014  8:27 AM ------      Message from: Jerene Bears      Created: Mon May 12, 2014  9:21 PM       Needs to come from PCP      JMP      ----- Message -----         From: Larina Bras, CMA         Sent: 05/12/2014   9:06 AM           To: Jerene Bears, MD            Dr Hilarie Fredrickson-      We have an rx request for atenolol on this patient.... It appears it has been filled by our office since 07/2011. However, I cannot find documentation where YOU ever actually ordered this for patient. Should I have patient get from her PCP? Last seen (01/24/13). Please advise...       ------

## 2014-05-13 NOTE — Telephone Encounter (Signed)
Rx denied

## 2014-07-12 ENCOUNTER — Other Ambulatory Visit: Payer: Self-pay | Admitting: Nurse Practitioner

## 2014-08-21 ENCOUNTER — Telehealth: Payer: Self-pay | Admitting: Internal Medicine

## 2014-08-21 ENCOUNTER — Other Ambulatory Visit: Payer: Self-pay | Admitting: Nurse Practitioner

## 2014-08-21 NOTE — Telephone Encounter (Signed)
Please advise if I may refill Sir, she made appointment for Feb.  Thank you.

## 2014-08-22 NOTE — Telephone Encounter (Signed)
We had previously wanted to change her to domperidone as per Hunt Oris note from earlier this year. This medication is preferred for longer term use due to the potential long-term side-effects of reglan therapy Before domperidone she would need an ekg Did she ever start this? Perhaps she should followup sooner with Nevin Bloodgood

## 2014-08-22 NOTE — Telephone Encounter (Signed)
Left message for patient to call back  

## 2014-08-25 NOTE — Telephone Encounter (Signed)
Left message for patient to call back  

## 2014-08-27 NOTE — Telephone Encounter (Signed)
Left message for patient to call back  

## 2014-08-28 NOTE — Telephone Encounter (Signed)
Patient did not return mutiple calls in an attempt to discuss her reglan. We will await a return phone call from her prior to proceeding with any refills of medications.

## 2014-09-01 ENCOUNTER — Telehealth: Payer: Self-pay | Admitting: *Deleted

## 2014-09-01 NOTE — Telephone Encounter (Signed)
error 

## 2014-09-01 NOTE — Telephone Encounter (Signed)
Patient returned call while I was in a room with a patient. She was asked to keep her phone with her so I could call her right back. I left a message on her voicemail to call back since I got no answer.

## 2014-09-02 NOTE — Telephone Encounter (Signed)
I have spoken to patient. She never started domperidone. I have advised that Dr Hilarie Fredrickson suggests this for long term use rather than reglan due to possible side effects of reglan. I have advised that she needs an EKG prior to starting medication (she can get this done by PCP). Once we get an EKG report, we can initiate domperidone medication. She verbalizes understanding and states that she will do this.

## 2014-10-07 ENCOUNTER — Encounter: Payer: Self-pay | Admitting: *Deleted

## 2014-10-20 ENCOUNTER — Ambulatory Visit: Payer: Self-pay | Admitting: Internal Medicine

## 2015-01-16 ENCOUNTER — Other Ambulatory Visit: Payer: Self-pay | Admitting: Internal Medicine

## 2015-01-16 DIAGNOSIS — R1011 Right upper quadrant pain: Secondary | ICD-10-CM

## 2015-01-28 ENCOUNTER — Encounter (INDEPENDENT_AMBULATORY_CARE_PROVIDER_SITE_OTHER): Payer: Self-pay

## 2015-01-28 ENCOUNTER — Ambulatory Visit
Admission: RE | Admit: 2015-01-28 | Discharge: 2015-01-28 | Disposition: A | Discharge status: Home / Self Care | Payer: BC Managed Care – PPO | Source: Ambulatory Visit | Attending: Internal Medicine | Admitting: Internal Medicine

## 2015-01-28 DIAGNOSIS — R1011 Right upper quadrant pain: Secondary | ICD-10-CM

## 2015-03-10 ENCOUNTER — Ambulatory Visit: Payer: Self-pay | Admitting: Cardiovascular Disease

## 2015-03-25 ENCOUNTER — Encounter: Payer: Self-pay | Admitting: Cardiovascular Disease

## 2016-10-25 ENCOUNTER — Other Ambulatory Visit: Payer: Self-pay | Admitting: Internal Medicine

## 2016-10-25 DIAGNOSIS — R590 Localized enlarged lymph nodes: Secondary | ICD-10-CM

## 2017-10-10 ENCOUNTER — Telehealth: Payer: Self-pay | Admitting: Internal Medicine

## 2017-10-10 NOTE — Telephone Encounter (Signed)
Pt states she has been having abdominal pain under her breast and started vomiting last night. States she could not keep her meds down. States she thinks she needs an EGD. Pt scheduled to see Selena Savoy NP 10/24/17@3pm . Discussed with pt that she should go to the ER or urgent care/PCP if she continues to have problems keeping her meds down. Pt verbalized understanding.

## 2017-10-24 ENCOUNTER — Ambulatory Visit: Payer: Self-pay | Admitting: Nurse Practitioner

## 2017-10-27 ENCOUNTER — Other Ambulatory Visit (INDEPENDENT_AMBULATORY_CARE_PROVIDER_SITE_OTHER): Payer: BC Managed Care – PPO

## 2017-10-27 ENCOUNTER — Ambulatory Visit: Payer: Self-pay | Admitting: Nurse Practitioner

## 2017-10-27 ENCOUNTER — Encounter (INDEPENDENT_AMBULATORY_CARE_PROVIDER_SITE_OTHER): Payer: Self-pay

## 2017-10-27 ENCOUNTER — Encounter: Payer: Self-pay | Admitting: Nurse Practitioner

## 2017-10-27 ENCOUNTER — Ambulatory Visit: Payer: BC Managed Care – PPO | Admitting: Nurse Practitioner

## 2017-10-27 VITALS — BP 118/60 | HR 72 | Ht 64.0 in | Wt 180.0 lb

## 2017-10-27 DIAGNOSIS — K8050 Calculus of bile duct without cholangitis or cholecystitis without obstruction: Secondary | ICD-10-CM | POA: Diagnosis not present

## 2017-10-27 LAB — COMPREHENSIVE METABOLIC PANEL
ALT: 17 U/L (ref 0–35)
AST: 17 U/L (ref 0–37)
Albumin: 4.3 g/dL (ref 3.5–5.2)
Alkaline Phosphatase: 187 U/L — ABNORMAL HIGH (ref 39–117)
BUN: 14 mg/dL (ref 6–23)
CALCIUM: 9.3 mg/dL (ref 8.4–10.5)
CO2: 30 meq/L (ref 19–32)
Chloride: 104 mEq/L (ref 96–112)
Creatinine, Ser: 1.01 mg/dL (ref 0.40–1.20)
GFR: 59.92 mL/min — ABNORMAL LOW (ref >60.00)
Glucose, Bld: 122 mg/dL — ABNORMAL HIGH (ref 70–99)
Potassium: 4.6 mEq/L (ref 3.5–5.1)
Sodium: 139 mEq/L (ref 135–145)
Total Bilirubin: 0.5 mg/dL (ref 0.2–1.2)
Total Protein: 7.2 g/dL (ref 6.0–8.3)

## 2017-10-27 LAB — CBC WITH DIFFERENTIAL/PLATELET
BASOS PCT: 0.6 % (ref 0.0–3.0)
Basophils Absolute: 0.1 10*3/uL (ref 0.0–0.1)
EOS ABS: 0.2 10*3/uL (ref 0.0–0.7)
Eosinophils Relative: 1.5 % (ref 0.0–5.0)
HEMATOCRIT: 43.6 % (ref 36.0–46.0)
Hemoglobin: 14.5 g/dL (ref 12.0–15.0)
LYMPHS PCT: 27.9 % (ref 12.0–46.0)
Lymphs Abs: 3.7 10*3/uL (ref 0.7–4.0)
MCHC: 33.4 g/dL (ref 30.0–36.0)
MCV: 85.2 fl (ref 78.0–100.0)
Monocytes Absolute: 1 10*3/uL (ref 0.1–1.0)
Monocytes Relative: 7.3 % (ref 3.0–12.0)
NEUTROS ABS: 8.4 10*3/uL — AB (ref 1.4–7.7)
Neutrophils Relative %: 62.7 % (ref 43.0–77.0)
PLATELETS: 375 10*3/uL (ref 150.0–400.0)
RBC: 5.12 Mil/uL — ABNORMAL HIGH (ref 3.87–5.11)
RDW: 14.4 % (ref 11.5–15.5)
WBC: 13.4 10*3/uL — ABNORMAL HIGH (ref 4.0–10.5)

## 2017-10-27 LAB — PROTIME-INR
INR: 1.1 ratio — ABNORMAL HIGH (ref 0.8–1.0)
Prothrombin Time: 11.5 s (ref 9.6–13.1)

## 2017-10-27 NOTE — Patient Instructions (Signed)
If you are age 58 or older, your body mass index should be between 23-30. Your Body mass index is 30.9 kg/m. If this is out of the aforementioned range listed, please consider follow up with your Primary Care Provider.  If you are age 67 or younger, your body mass index should be between 19-25. Your Body mass index is 30.9 kg/m. If this is out of the aformentioned range listed, please consider follow up with your Primary Care Provider.   Your physician has requested that you go to the basement for the following lab work before leaving today: CMP CBC w/Diff INR  Will call regarding ERCP.  Thank you for choosing me and [facility].   Tye Savoy, NP

## 2017-10-27 NOTE — Progress Notes (Addendum)
Chief Complaint: abdominal pain, abnormal u/s  Referring Provider:   Jani Gravel, MD     ASSESSMENT AND PLAN;    57 yo female with chronic abdominal pain. Presenting with RUQ pain / nausea in setting of U/S revealing CBD dilation and CBD stones. I didn't get copy of labs from PCP but patient says they were abnormal.  -repeat LFTs today.  -CBC, INR -She will need ERCP with stone extraction, ? Extension of sphincterotomy.  The risk and benefits of the procedure were discussed and patient agrees to proceed.  Case discussed with patient's primary GI, Dr. Hilarie Fredrickson, arrange for one of his partners to perform the ERCP. Will try to have this done next week.  Patient does not look toxic.  I did advise her to go to the emergency department over the weekend should she develop fevers and/or worsening abdominal pain.  Otherwise, I will call her with labs and ERCP date and time  HPI:    Selena Farrell is a 57 year old female known remotely to Dr. Hilarie Fredrickson. She has a history of H. pylori related PUD in 2012.  She has a hx of paillary stenosis vrs SOB-1 for which she underwent ERCP with sphincterotomy  . She is status post ERCP with sphincterotomy in 2012. She is status post laparoscopic cholecystectomy for chronic cholecystitis/ chronic cholecystitis in 2014. We haven't seen in several years.  She is referred back by her PCP Dr. Maudie Mercury for common bile duct stones  [name] has been having intermittent right upper quadrant pain for months.  Pain associated with nausea.  Pain radiates through to her right back. No fevers., No weight loss. Pain feels similar to years ago when she had biliary problems. No NSAID use. No blood in stools or black stools.    Past Medical History:  Diagnosis Date  . Alcohol dependence ([place])    Quit Sept. 2012   . Bronchitis   . Choledocholithiasis 06/23/2011   ERCP, shincterotomy and baloon stone extraction by Dr Fuller Plan.   . Cough 07/29/2013  . Delayed gastric emptying   . Depression   .  External hemorrhoid   . Gastric ulcer 05/2011   EGD-Dr. Hilarie Fredrickson   . Gastritis 05/2011   EGD-Dr. Hilarie Fredrickson   . GERD (gastroesophageal reflux disease)   . Helicobacter pylori (H. pylori) infection    Hx of   . Hyperplastic colon polyp   . Hypertension   . Hyponatremia   . Internal hemorrhoids   . Nicotine dependence   . Tenosynovitis    right MC joint     Past Surgical History:  Procedure Laterality Date  . APPENDECTOMY    . cervical neck fusion  03/2004   C5-C6 anterior discectomy and fusion with allograft  . CESAREAN SECTION     x1  . CHOLECYSTECTOMY N/A 11/29/2012   Procedure: LAPAROSCOPIC CHOLECYSTECTOMY WITH INTRAOPERATIVE CHOLANGIOGRAM;  Surgeon: Earnstine Regal, MD;  Location: [facility];  Service: General;  Laterality: N/A;  . ERCP W/ SPHICTEROTOMY  06/23/2011   ERCP, shincterotomy and baloon stone extraction by Dr Fuller Plan. 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   . ESOPHAGOGASTRODUODENOSCOPY N/A 11/28/2012   Procedure: ESOPHAGOGASTRODUODENOSCOPY (EGD);  Surgeon: Jerene Bears, MD;  Location: [facility];  Service: Gastroenterology;  Laterality: N/A;   Family History  Problem Relation Age of Onset  . Alcohol abuse Mother   . Stroke Father   . Cancer Maternal Grandfather        prostate  . Aneurysm Sister   .  Colon cancer Neg Hx    Social History   Tobacco Use  . Smoking status: Current Every Day Smoker    Packs/day: 0.50    Years: 25.00    Pack years: 12.50    Types: Cigarettes  . Smokeless tobacco: Never Used  . Tobacco comment: Counseling to quit smoking given to patient in exam room   Substance Use Topics  . Alcohol use: No    Comment: Heavy drinker quit  Sept 2012   . Drug use: No   Current Outpatient Medications  Medication Sig Dispense Refill  . atenolol (TENORMIN) 25 MG tablet Take 1 tablet (25 mg total) by mouth daily. For High blood pressure    . citalopram (CELEXA) 40 MG tablet Take 1 tablet (40 mg total) by mouth daily. For depression 30 tablet  0  . docusate sodium (COLACE) 100 MG capsule Take 1 capsule (100 mg total) by mouth daily as needed for mild constipation. 10 capsule 0  . gabapentin (NEURONTIN) 300 MG capsule Take 1 capsule (300 mg total) by mouth 3 (three) times daily. For substance withdrawal syndrome/pain management 90 capsule 0  . hydrOXYzine (ATARAX/VISTARIL) 25 MG tablet Take 25 mg (1 tablet) three times daily as needed for anxiety 90 tablet 0  . lidocaine (LIDODERM) 5 % Place 1 patch onto the skin daily. Remove & Discard patch within 12 hours or as directed by MD: For pain 5 patch 0  . metoCLOPramide (REGLAN) 5 MG tablet Take 1 tablet (5 mg) three times daily & 2 tablets (10 mg) at bedtime: Acid reflux 150 tablet 0  . omeprazole (PRILOSEC) 20 MG capsule TAKE 2 CAPSULES BY MOUTH EVERY DAY 60 capsule 2  . temazepam (RESTORIL) 30 MG capsule Take 1 capsule (30 mg total) by mouth at bedtime as needed for sleep. 14 capsule 0  . nicotine (NICODERM CQ - DOSED IN MG/24 HOURS) 21 mg/24hr patch Place 1 patch (21 mg total) onto the skin daily. For Nicotine addiction (Patient not taking: Reported on 10/27/2017) 28 patch 0   No current facility-administered medications for this visit.    Allergies  Allergen Reactions  . Aspirin Other (See Comments)    Pt has bleeding ulcer, not allowed to take aspirin  . Celebrex [Celecoxib] Nausea Only  . Nsaids Nausea Only  . Vicodin [Hydrocodone-Acetaminophen] Nausea And Vomiting    Can take with antiemetics.     Review of Systems: All systems reviewed and negative except where noted in HPI.    Physical Exam:    BP 118/60   Pulse 72   Ht 5' 4"  (1.626 m)   Wt 180 lb (81.6 kg)   BMI 30.90 kg/m  Constitutional:  Well-developed, white female in no acute distress. Psychiatric: Normal mood and affect. Behavior is normal. EENT: Pupils normal.  Conjunctivae are normal. No scleral icterus. Neck supple.  Cardiovascular: Normal rate, regular rhythm. No edema Pulmonary/chest: Effort normal  and breath sounds normal. No wheezing, rales or rhonchi. Abdominal: Soft, nondistended. Nontender. Bowel sounds active throughout. There are no masses palpable. No hepatomegaly. Neurological: Alert and oriented to person place and time. Skin: Skin is warm and dry. No rashes noted.  Tye Savoy, NP  10/27/2017, 2:59 PM  Cc: Jani Gravel, MD  Addendum: Reviewed and agree with management. Patient with elevated alk phos but also common bile duct stones by ultrasound performed on 10/26/2017 at [facility].  Report reviewed by Care Everywhere. AST and ALT are normal as is total bilirubin is 0.5 arguing against obstruction  Patient nontoxic per Tye Savoy, NP Will ask Oretha Caprice, MD to help with outpatient ERCP for stone extraction They will be a short wait for this procedure but patient should be instructed in the interim if she develops worsening abdominal pain, fever, chills, nausea, vomiting that she should notify us immediately  Pyrtle, Lajuan Lines, MD

## 2017-10-30 ENCOUNTER — Telehealth: Payer: Self-pay

## 2017-10-30 ENCOUNTER — Other Ambulatory Visit: Payer: Self-pay

## 2017-10-30 ENCOUNTER — Encounter: Payer: Self-pay | Admitting: Nurse Practitioner

## 2017-10-30 DIAGNOSIS — K8051 Calculus of bile duct without cholangitis or cholecystitis with obstruction: Secondary | ICD-10-CM

## 2017-10-30 NOTE — Telephone Encounter (Signed)
-----  Message from Milus Banister, MD sent at 10/30/2017  5:19 PM EST ----- Happy to.  It'll be Thursday March 7th.  If she gets sick before then, she needs to go to ER or would need hospital biliary coverage to take care of it. Thanks   Chong Sicilian, She needs ERCP March 7th Thursday for bile duct stone.  Thanks  dj  ----- Message ----- From: Jerene Bears, MD Sent: 10/30/2017   4:53 PM To: Milus Banister, MD  Dan This is the patient that I mentioned to you last week Nevin Bloodgood saw her and nontoxic, only alk phos up and normal bili/ast/alt Can you help with ERCP? Thanks again [employer]

## 2017-10-30 NOTE — Telephone Encounter (Signed)
The pt has been scheduled for ERCP on 11/16/17 930 am.  Endo needs to be called, instructions mailed to the home.  Left message on machine to call back

## 2017-10-30 NOTE — Addendum Note (Signed)
Addended by: Jerene Bears on: 10/30/2017 04:54 PM   Modules accepted: Level of Service

## 2017-10-31 ENCOUNTER — Telehealth: Payer: Self-pay

## 2017-10-31 NOTE — Telephone Encounter (Signed)
-----   Message from Willia Craze, NP sent at 10/31/2017  1:02 PM EST ----- Selena Farrell, please call [name] and tell her that overall liver labs don't look too bad but we still need to get stone out. Dr. Hilarie Fredrickson is working on getting date and time for Dr. Ardis Hughs to do it. Thanks

## 2017-10-31 NOTE — Telephone Encounter (Signed)
ERCP scheduled, pt instructed and medications reviewed.  Patient instructions mailed to home.  Patient to call with any questions or concerns.  

## 2017-10-31 NOTE — Telephone Encounter (Signed)
Message  Received: Today  Message Contents  Selena Craze, NP  Dumas, Real Cons, LPN        Selena Farrell, please call Selena Farrell and tell her that overall liver labs don't look too bad but we still need to get stone out. Dr. Hilarie Fredrickson is working on getting date and time for Dr. Ardis Hughs to do it. Thanks    Spoke with the patient. [name], RN has already contacted her for Dr Ardis Hughs.

## 2017-11-09 ENCOUNTER — Encounter (HOSPITAL_COMMUNITY): Payer: Self-pay | Admitting: *Deleted

## 2017-11-09 DIAGNOSIS — E669 Obesity, unspecified: Secondary | ICD-10-CM | POA: Diagnosis not present

## 2017-11-09 DIAGNOSIS — Z79899 Other long term (current) drug therapy: Secondary | ICD-10-CM | POA: Diagnosis not present

## 2017-11-09 DIAGNOSIS — Z9049 Acquired absence of other specified parts of digestive tract: Secondary | ICD-10-CM | POA: Diagnosis not present

## 2017-11-09 DIAGNOSIS — Z6831 Body mass index (BMI) 31.0-31.9, adult: Secondary | ICD-10-CM | POA: Diagnosis not present

## 2017-11-09 DIAGNOSIS — K8051 Calculus of bile duct without cholangitis or cholecystitis with obstruction: Secondary | ICD-10-CM | POA: Diagnosis not present

## 2017-11-09 DIAGNOSIS — K219 Gastro-esophageal reflux disease without esophagitis: Secondary | ICD-10-CM | POA: Insufficient documentation

## 2017-11-09 DIAGNOSIS — K279 Peptic ulcer, site unspecified, unspecified as acute or chronic, without hemorrhage or perforation: Secondary | ICD-10-CM | POA: Insufficient documentation

## 2017-11-09 DIAGNOSIS — Z885 Allergy status to narcotic agent status: Secondary | ICD-10-CM | POA: Diagnosis not present

## 2017-11-09 DIAGNOSIS — I10 Essential (primary) hypertension: Secondary | ICD-10-CM | POA: Diagnosis not present

## 2017-11-09 DIAGNOSIS — Z8619 Personal history of other infectious and parasitic diseases: Secondary | ICD-10-CM | POA: Insufficient documentation

## 2017-11-09 DIAGNOSIS — F329 Major depressive disorder, single episode, unspecified: Secondary | ICD-10-CM | POA: Insufficient documentation

## 2017-11-09 DIAGNOSIS — F17210 Nicotine dependence, cigarettes, uncomplicated: Secondary | ICD-10-CM | POA: Insufficient documentation

## 2017-11-09 DIAGNOSIS — K8050 Calculus of bile duct without cholangitis or cholecystitis without obstruction: Secondary | ICD-10-CM | POA: Diagnosis present

## 2017-11-09 DIAGNOSIS — Z886 Allergy status to analgesic agent status: Secondary | ICD-10-CM | POA: Diagnosis not present

## 2017-11-09 LAB — COMPREHENSIVE METABOLIC PANEL
ALT: 54 U/L (ref 14–54)
AST: 118 U/L — AB (ref 15–41)
Albumin: 3.9 g/dL (ref 3.5–5.0)
Alkaline Phosphatase: 240 U/L — ABNORMAL HIGH (ref 38–126)
Anion gap: 9 (ref 5–15)
BUN: 14 mg/dL (ref 6–20)
CO2: 29 mmol/L (ref 22–32)
CREATININE: 1.05 mg/dL — AB (ref 0.44–1.00)
Calcium: 9.2 mg/dL (ref 8.9–10.3)
Chloride: 102 mmol/L (ref 101–111)
GFR calc non Af Amer: 58 mL/min — ABNORMAL LOW (ref >60)
Glucose, Bld: 109 mg/dL — ABNORMAL HIGH (ref 65–99)
POTASSIUM: 4 mmol/L (ref 3.5–5.1)
Sodium: 140 mmol/L (ref 135–145)
Total Bilirubin: 0.9 mg/dL (ref 0.3–1.2)
Total Protein: 6.9 g/dL (ref 6.5–8.1)

## 2017-11-09 LAB — CBC
HEMATOCRIT: 43.2 % (ref 36.0–46.0)
HEMOGLOBIN: 13.8 g/dL (ref 12.0–15.0)
MCH: 28 pg (ref 26.0–34.0)
MCHC: 31.9 g/dL (ref 30.0–36.0)
MCV: 87.8 fL (ref 78.0–100.0)
PLATELETS: 316 10*3/uL (ref 150–400)
RBC: 4.92 MIL/uL (ref 3.87–5.11)
RDW: 14.2 % (ref 11.5–15.5)
WBC: 15.7 10*3/uL — ABNORMAL HIGH (ref 4.0–10.5)

## 2017-11-09 LAB — LIPASE, BLOOD: LIPASE: 32 U/L (ref 11–51)

## 2017-11-09 MED ORDER — ONDANSETRON 4 MG PO TBDP
4.0000 mg | ORAL_TABLET | Freq: Once | ORAL | Status: AC
  Administered 2017-11-09: 4 mg via ORAL
  Filled 2017-11-09: qty 1

## 2017-11-09 MED ORDER — OXYCODONE-ACETAMINOPHEN 5-325 MG PO TABS
1.0000 | ORAL_TABLET | ORAL | Status: DC | PRN
  Administered 2017-11-09: 1 via ORAL
  Filled 2017-11-09: qty 1

## 2017-11-09 NOTE — ED Triage Notes (Signed)
Pt states that she is having pain in the right upper abdomen and nausea. Pt has known gallstones in the duct area,  due to have surgery with Dr. Eugenio Hoes next week. Last took Zofran around 4pm.

## 2017-11-10 ENCOUNTER — Observation Stay (HOSPITAL_COMMUNITY): Payer: BC Managed Care – PPO

## 2017-11-10 ENCOUNTER — Observation Stay (HOSPITAL_COMMUNITY): Payer: BC Managed Care – PPO | Admitting: Registered Nurse

## 2017-11-10 ENCOUNTER — Emergency Department (HOSPITAL_COMMUNITY): Payer: BC Managed Care – PPO

## 2017-11-10 ENCOUNTER — Encounter (HOSPITAL_COMMUNITY)
Admission: EM | Disposition: A | Discharge status: Home / Self Care | Payer: Self-pay | Source: Home / Self Care | Attending: Emergency Medicine

## 2017-11-10 ENCOUNTER — Observation Stay (HOSPITAL_COMMUNITY)
Admission: EM | Admit: 2017-11-10 | Discharge: 2017-11-11 | Disposition: A | Discharge status: Home / Self Care | Payer: BC Managed Care – PPO | Attending: Internal Medicine | Admitting: Internal Medicine

## 2017-11-10 ENCOUNTER — Encounter (HOSPITAL_COMMUNITY): Payer: Self-pay

## 2017-11-10 ENCOUNTER — Other Ambulatory Visit: Payer: Self-pay

## 2017-11-10 DIAGNOSIS — K8051 Calculus of bile duct without cholangitis or cholecystitis with obstruction: Secondary | ICD-10-CM | POA: Diagnosis not present

## 2017-11-10 DIAGNOSIS — R1011 Right upper quadrant pain: Secondary | ICD-10-CM | POA: Diagnosis not present

## 2017-11-10 DIAGNOSIS — K8050 Calculus of bile duct without cholangitis or cholecystitis without obstruction: Secondary | ICD-10-CM | POA: Diagnosis not present

## 2017-11-10 DIAGNOSIS — K8033 Calculus of bile duct with acute cholangitis with obstruction: Secondary | ICD-10-CM

## 2017-11-10 DIAGNOSIS — K8309 Other cholangitis: Secondary | ICD-10-CM

## 2017-11-10 DIAGNOSIS — K279 Peptic ulcer, site unspecified, unspecified as acute or chronic, without hemorrhage or perforation: Secondary | ICD-10-CM | POA: Diagnosis not present

## 2017-11-10 DIAGNOSIS — K831 Obstruction of bile duct: Secondary | ICD-10-CM | POA: Diagnosis not present

## 2017-11-10 DIAGNOSIS — Z9049 Acquired absence of other specified parts of digestive tract: Secondary | ICD-10-CM | POA: Diagnosis not present

## 2017-11-10 DIAGNOSIS — K8031 Calculus of bile duct with cholangitis, unspecified, with obstruction: Secondary | ICD-10-CM | POA: Diagnosis not present

## 2017-11-10 DIAGNOSIS — R109 Unspecified abdominal pain: Secondary | ICD-10-CM | POA: Diagnosis present

## 2017-11-10 HISTORY — PX: ERCP: SHX5425

## 2017-11-10 LAB — URINALYSIS, ROUTINE W REFLEX MICROSCOPIC
BILIRUBIN URINE: NEGATIVE
Glucose, UA: NEGATIVE mg/dL
HGB URINE DIPSTICK: NEGATIVE
Ketones, ur: NEGATIVE mg/dL
NITRITE: NEGATIVE
Protein, ur: NEGATIVE mg/dL
SPECIFIC GRAVITY, URINE: 1.016 (ref 1.005–1.030)
pH: 8 (ref 5.0–8.0)

## 2017-11-10 LAB — PREGNANCY, URINE: PREG TEST UR: NEGATIVE

## 2017-11-10 SURGERY — ERCP, WITH INTERVENTION IF INDICATED
Anesthesia: General

## 2017-11-10 MED ORDER — SODIUM CHLORIDE 0.9 % IV SOLN
INTRAVENOUS | Status: DC | PRN
  Administered 2017-11-10: 50 mL

## 2017-11-10 MED ORDER — GLUCAGON HCL RDNA (DIAGNOSTIC) 1 MG IJ SOLR
INTRAMUSCULAR | Status: DC | PRN
  Administered 2017-11-10: .5 mg via INTRAVENOUS

## 2017-11-10 MED ORDER — INDOMETHACIN 50 MG RE SUPP
RECTAL | Status: AC
  Filled 2017-11-10: qty 2

## 2017-11-10 MED ORDER — SODIUM CHLORIDE 0.9 % IV BOLUS (SEPSIS)
1000.0000 mL | Freq: Once | INTRAVENOUS | Status: AC
  Administered 2017-11-10: 1000 mL via INTRAVENOUS

## 2017-11-10 MED ORDER — ONDANSETRON HCL 4 MG PO TABS: 4.0000 mg | ORAL_TABLET | Freq: Four times a day (QID) | ORAL | Status: DC | PRN

## 2017-11-10 MED ORDER — FENTANYL CITRATE (PF) 100 MCG/2ML IJ SOLN: 25.0000 ug | INTRAMUSCULAR | Status: DC | PRN

## 2017-11-10 MED ORDER — GLUCAGON HCL RDNA (DIAGNOSTIC) 1 MG IJ SOLR
INTRAMUSCULAR | Status: AC
  Filled 2017-11-10: qty 1

## 2017-11-10 MED ORDER — SODIUM CHLORIDE 0.9 % IV SOLN
INTRAVENOUS | Status: AC
  Administered 2017-11-10: 10:00:00 via INTRAVENOUS

## 2017-11-10 MED ORDER — ONDANSETRON 4 MG PO TBDP: 4.0000 mg | ORAL_TABLET | Freq: Four times a day (QID) | ORAL | Status: DC | PRN

## 2017-11-10 MED ORDER — PANTOPRAZOLE SODIUM 40 MG PO TBEC
40.0000 mg | DELAYED_RELEASE_TABLET | Freq: Every day | ORAL | Status: DC
  Administered 2017-11-10 – 2017-11-11 (×2): 40 mg via ORAL
  Filled 2017-11-10 (×2): qty 1

## 2017-11-10 MED ORDER — INDOMETHACIN 50 MG RE SUPP
RECTAL | Status: DC | PRN
  Administered 2017-11-10: 100 mg via RECTAL

## 2017-11-10 MED ORDER — FENTANYL CITRATE (PF) 100 MCG/2ML IJ SOLN
INTRAMUSCULAR | Status: AC
  Filled 2017-11-10: qty 2

## 2017-11-10 MED ORDER — ONDANSETRON HCL 4 MG/2ML IJ SOLN: 4.0000 mg | Freq: Once | INTRAMUSCULAR | Status: DC | PRN

## 2017-11-10 MED ORDER — FENTANYL CITRATE (PF) 100 MCG/2ML IJ SOLN
50.0000 ug | Freq: Once | INTRAMUSCULAR | Status: AC
  Administered 2017-11-10: 50 ug via INTRAVENOUS
  Filled 2017-11-10: qty 2

## 2017-11-10 MED ORDER — PROPOFOL 10 MG/ML IV BOLUS
INTRAVENOUS | Status: DC | PRN
  Administered 2017-11-10: 160 mg via INTRAVENOUS

## 2017-11-10 MED ORDER — FENTANYL CITRATE (PF) 100 MCG/2ML IJ SOLN
INTRAMUSCULAR | Status: DC | PRN
  Administered 2017-11-10 (×2): 50 ug via INTRAVENOUS

## 2017-11-10 MED ORDER — ATENOLOL 25 MG PO TABS
25.0000 mg | ORAL_TABLET | Freq: Every day | ORAL | Status: DC
  Administered 2017-11-10 – 2017-11-11 (×2): 25 mg via ORAL
  Filled 2017-11-10 (×2): qty 1

## 2017-11-10 MED ORDER — SCOPOLAMINE 1 MG/3DAYS TD PT72
MEDICATED_PATCH | TRANSDERMAL | Status: DC | PRN
  Administered 2017-11-10: 1 via TRANSDERMAL

## 2017-11-10 MED ORDER — SUGAMMADEX SODIUM 200 MG/2ML IV SOLN
INTRAVENOUS | Status: DC | PRN
  Administered 2017-11-10: 180 mg via INTRAVENOUS

## 2017-11-10 MED ORDER — ONDANSETRON HCL 4 MG/2ML IJ SOLN: 4.0000 mg | Freq: Four times a day (QID) | INTRAMUSCULAR | Status: DC | PRN

## 2017-11-10 MED ORDER — MIDAZOLAM HCL 2 MG/2ML IJ SOLN
INTRAMUSCULAR | Status: AC
  Filled 2017-11-10: qty 2

## 2017-11-10 MED ORDER — ONDANSETRON HCL 4 MG/2ML IJ SOLN
INTRAMUSCULAR | Status: DC | PRN
  Administered 2017-11-10: 4 mg via INTRAVENOUS

## 2017-11-10 MED ORDER — LIP MEDEX EX OINT
TOPICAL_OINTMENT | CUTANEOUS | Status: AC
  Filled 2017-11-10: qty 7

## 2017-11-10 MED ORDER — DEXAMETHASONE SODIUM PHOSPHATE 10 MG/ML IJ SOLN
INTRAMUSCULAR | Status: DC | PRN
  Administered 2017-11-10: 10 mg via INTRAVENOUS

## 2017-11-10 MED ORDER — ONDANSETRON HCL 4 MG/2ML IJ SOLN
4.0000 mg | Freq: Once | INTRAMUSCULAR | Status: AC
  Administered 2017-11-10: 4 mg via INTRAVENOUS
  Filled 2017-11-10: qty 2

## 2017-11-10 MED ORDER — ROCURONIUM BROMIDE 10 MG/ML (PF) SYRINGE
PREFILLED_SYRINGE | INTRAVENOUS | Status: DC | PRN
  Administered 2017-11-10: 50 mg via INTRAVENOUS

## 2017-11-10 MED ORDER — SODIUM CHLORIDE 0.9 % IV BOLUS (SEPSIS)
500.0000 mL | Freq: Once | INTRAVENOUS | Status: AC
  Administered 2017-11-10: 500 mL via INTRAVENOUS

## 2017-11-10 MED ORDER — MIDAZOLAM HCL 5 MG/5ML IJ SOLN
INTRAMUSCULAR | Status: DC | PRN
  Administered 2017-11-10: 2 mg via INTRAVENOUS

## 2017-11-10 MED ORDER — INDOMETHACIN 50 MG RE SUPP
100.0000 mg | Freq: Once | RECTAL | Status: DC
  Filled 2017-11-10: qty 2

## 2017-11-10 MED ORDER — GABAPENTIN 300 MG PO CAPS
300.0000 mg | ORAL_CAPSULE | Freq: Three times a day (TID) | ORAL | Status: DC
  Administered 2017-11-10 – 2017-11-11 (×3): 300 mg via ORAL
  Filled 2017-11-10 (×3): qty 1

## 2017-11-10 MED ORDER — TEMAZEPAM 15 MG PO CAPS: 30.0000 mg | ORAL_CAPSULE | Freq: Every evening | ORAL | Status: DC | PRN

## 2017-11-10 MED ORDER — LIDOCAINE 2% (20 MG/ML) 5 ML SYRINGE
INTRAMUSCULAR | Status: DC | PRN
  Administered 2017-11-10: 80 mg via INTRAVENOUS

## 2017-11-10 MED ORDER — SODIUM CHLORIDE 0.9 % IV SOLN
1.5000 g | Freq: Four times a day (QID) | INTRAVENOUS | Status: DC
  Administered 2017-11-10 – 2017-11-11 (×4): 1.5 g via INTRAVENOUS
  Filled 2017-11-10 (×5): qty 1.5

## 2017-11-10 MED ORDER — SCOPOLAMINE 1 MG/3DAYS TD PT72
MEDICATED_PATCH | TRANSDERMAL | Status: AC
  Filled 2017-11-10: qty 1

## 2017-11-10 MED ORDER — PROPOFOL 10 MG/ML IV BOLUS
INTRAVENOUS | Status: AC
  Filled 2017-11-10: qty 20

## 2017-11-10 MED ORDER — TRAZODONE HCL 50 MG PO TABS
150.0000 mg | ORAL_TABLET | Freq: Every evening | ORAL | Status: DC | PRN
  Administered 2017-11-10: 150 mg via ORAL
  Filled 2017-11-10: qty 1

## 2017-11-10 MED ORDER — AMPICILLIN-SULBACTAM SODIUM 1.5 (1-0.5) G IJ SOLR
1.5000 g | Freq: Once | INTRAMUSCULAR | Status: AC
  Administered 2017-11-10: 1.5 g via INTRAVENOUS
  Filled 2017-11-10: qty 1.5

## 2017-11-10 NOTE — Anesthesia Preprocedure Evaluation (Signed)
Anesthesia Evaluation  Patient identified by MRN, date of birth, ID band Patient awake    Reviewed: Allergy & Precautions, NPO status , Patient's Chart, lab work & pertinent test results  Airway Mallampati: II  TM Distance: >3 FB     Dental  (+) Edentulous Upper   Pulmonary Current Smoker,    breath sounds clear to auscultation       Cardiovascular hypertension,  Rhythm:Regular Rate:Normal     Neuro/Psych    GI/Hepatic   Endo/Other    Renal/GU      Musculoskeletal   Abdominal   Peds  Hematology   Anesthesia Other Findings   Reproductive/Obstetrics                             Anesthesia Physical Anesthesia Plan  ASA: III  Anesthesia Plan:    Post-op Pain Management:    Induction: Intravenous  PONV Risk Score and Plan: 1 and Ondansetron and Dexamethasone  Airway Management Planned: Oral ETT  Additional Equipment:   Intra-op Plan:   Post-operative Plan: Extubation in OR  Informed Consent: I have reviewed the patients History and Physical, chart, labs and discussed the procedure including the risks, benefits and alternatives for the proposed anesthesia with the patient or authorized representative who has indicated his/her understanding and acceptance.   Dental advisory given  Plan Discussed with: CRNA and Anesthesiologist  Anesthesia Plan Comments:         Anesthesia Quick Evaluation

## 2017-11-10 NOTE — Progress Notes (Signed)
Pt placed on RA, sats began to trend downward to 88-89%, placed  on O2 [place] 4L.  C/O of throat pain, difficulty speaking secondary to throat pain and difficulty breathing.  Lungs with crackles in LLB.  Anesthesia MD notified and came to assess patient.  Incentive spirometer instructions given and patient was able to use effectively with good air flow up to [phone].  Encouraged deep breathing.  Pt able to tolerate small sips of liquid.  Sitting up in bed.  [place] down to 2L [place].  Will continue to monitor and MD to return to assess before returning to floor.

## 2017-11-10 NOTE — ED Provider Notes (Signed)
[facility] DEPT Provider Note   CSN: [id number] Arrival date & time: 11/09/17  1906  Time seen 03:00 AM   History   Chief Complaint Chief Complaint  Patient presents with  . Abdominal Pain    HPI Selena Farrell is a 57 y.o. female.  HPI patient states she had a cholecystectomy done about 5 years ago, however she has had Choledocholithiasis and is scheduled to have a ERCP on March 7.  She states about 4 PM she started having right upper quadrant pain radiating into her back.  It has been constant and described as sharp.  She states breathing makes the pain worse.  She has had nausea without vomiting or diarrhea.  She states her temperature was 99.9 at home.  PCP Jani Gravel, MD   Past Medical History:  Diagnosis Date  . Alcohol dependence ([place])    Quit Sept. 2012   . Bronchitis   . Choledocholithiasis 06/23/2011   ERCP, shincterotomy and baloon stone extraction by Dr Fuller Plan.   . Cough 07/29/2013  . Delayed gastric emptying   . Depression   . External hemorrhoid   . Gastric ulcer 05/2011   EGD-Dr. Hilarie Fredrickson   . Gastritis 05/2011   EGD-Dr. Hilarie Fredrickson   . GERD (gastroesophageal reflux disease)   . Helicobacter pylori (H. pylori) infection    Hx of   . Hyperplastic colon polyp   . Hypertension   . Hyponatremia   . Internal hemorrhoids   . Nicotine dependence   . Tenosynovitis    right [facility] joint    Patient Active Problem List   Diagnosis Date Noted  . Opioid type dependence ([place]) 12/29/2013    Class: Acute  . MDD (major depressive disorder) 12/27/2013  . Cough 07/29/2013  . Unspecified constipation 12/25/2012  . Nausea alone 12/25/2012  . Diffuse abdominal pain 12/25/2012  . Hemorrhoids 12/25/2012  . Leukocytosis, unspecified 11/28/2012  . Abdominal pain 11/27/2012  . PUD (peptic ulcer disease) 06/15/2011  . Tobacco abuse 06/14/2011  . Helicobacter positive gastritis 06/14/2011    Past Surgical History:  Procedure Laterality Date    . APPENDECTOMY    . cervical neck fusion  03/2004   C5-C6 anterior discectomy and fusion with allograft  . CESAREAN SECTION     x1  . CHOLECYSTECTOMY N/A 11/29/2012   Procedure: LAPAROSCOPIC CHOLECYSTECTOMY WITH INTRAOPERATIVE CHOLANGIOGRAM;  Surgeon: Earnstine Regal, MD;  Location: [facility];  Service: General;  Laterality: N/A;  . ERCP W/ SPHICTEROTOMY  06/23/2011   ERCP, shincterotomy and baloon stone extraction by Dr Fuller Plan. 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   . ESOPHAGOGASTRODUODENOSCOPY N/A 11/28/2012   Procedure: ESOPHAGOGASTRODUODENOSCOPY (EGD);  Surgeon: Jerene Bears, MD;  Location: [facility];  Service: Gastroenterology;  Laterality: N/A;    OB History    No data available       Home Medications    Prior to Admission medications   Medication Sig Start Date End Date Taking? Authorizing Provider  atenolol (TENORMIN) 25 MG tablet Take 1 tablet (25 mg total) by mouth daily. For High blood pressure 01/01/14  Yes Nwoko, Agnes I, NP  gabapentin (NEURONTIN) 300 MG capsule Take 1 capsule (300 mg total) by mouth 3 (three) times daily. For substance withdrawal syndrome/pain management 01/01/14  Yes Lindell Spar I, NP  hydrOXYzine (ATARAX/VISTARIL) 25 MG tablet Take 25 mg (1 tablet) three times daily as needed for anxiety 01/01/14  Yes Nwoko, Herbert Pun I, NP  metoCLOPramide (REGLAN) 5 MG tablet Take 1  tablet (5 mg) three times daily & 2 tablets (10 mg) at bedtime: Acid reflux 01/01/14  Yes Nwoko, Herbert Pun I, NP  omeprazole (PRILOSEC) 20 MG capsule TAKE 2 CAPSULES BY MOUTH EVERY DAY   Yes Pyrtle, Lajuan Lines, MD  ondansetron (ZOFRAN-ODT) 4 MG disintegrating tablet Take 1 tablet by mouth every 6 (six) hours as needed for nausea.  11/03/17  Yes [provider]  temazepam (RESTORIL) 30 MG capsule Take 1 capsule (30 mg total) by mouth at bedtime as needed for sleep. 01/01/14  Yes Lindell Spar I, NP  traZODone (DESYREL) 150 MG tablet Take 150 mg by mouth at bedtime as needed for sleep.   11/06/17  Yes [provider]  citalopram (CELEXA) 40 MG tablet Take 1 tablet (40 mg total) by mouth daily. For depression Patient not taking: Reported on 11/10/2017 01/01/14   Lindell Spar I, NP  docusate sodium (COLACE) 100 MG capsule Take 1 capsule (100 mg total) by mouth daily as needed for mild constipation. Patient not taking: Reported on 11/10/2017 01/01/14   Lindell Spar I, NP  lidocaine (LIDODERM) 5 % Place 1 patch onto the skin daily. Remove & Discard patch within 12 hours or as directed by MD: For pain Patient not taking: Reported on 11/10/2017 01/01/14   Lindell Spar I, NP  nicotine (NICODERM CQ - DOSED IN MG/24 HOURS) 21 mg/24hr patch Place 1 patch (21 mg total) onto the skin daily. For Nicotine addiction Patient not taking: Reported on 10/27/2017 01/01/14   Encarnacion Slates, NP    Family History Family History  Problem Relation Age of Onset  . Alcohol abuse Mother   . Stroke Father   . Cancer Maternal Grandfather        prostate  . Aneurysm Sister   . Colon cancer Neg Hx     Social History Social History   Tobacco Use  . Smoking status: Current Every Day Smoker    Packs/day: 0.50    Years: 25.00    Pack years: 12.50    Types: Cigarettes  . Smokeless tobacco: Never Used  . Tobacco comment: Counseling to quit smoking given to patient in exam room   Substance Use Topics  . Alcohol use: No    Comment: Heavy drinker quit  Sept 2012   . Drug use: No  employed   Allergies   Aspirin; Celebrex [celecoxib]; Nsaids; and Vicodin [hydrocodone-acetaminophen]   Review of Systems Review of Systems  All other systems reviewed and are negative.    Physical Exam Updated Vital Signs BP 108/61 (BP Location: Left Arm)   Pulse 79   Temp 98 F (36.7 C) (Oral)   Resp 20   SpO2 95%   Physical Exam  Constitutional: She is oriented to person, place, and time. She appears well-developed and well-nourished.  Non-toxic appearance. She does not appear ill. No distress.  HENT:    Head: Normocephalic and atraumatic.  Right Ear: External ear normal.  Left Ear: External ear normal.  Nose: Nose normal. No mucosal edema or rhinorrhea.  Mouth/Throat: Oropharynx is clear and moist and mucous membranes are normal. No dental abscesses or uvula swelling.  Eyes: Conjunctivae and EOM are normal. Pupils are equal, round, and reactive to light.  Neck: Normal range of motion and full passive range of motion without pain. Neck supple.  Cardiovascular: Normal rate, regular rhythm and normal heart sounds. Exam reveals no gallop and no friction rub.  No murmur heard. Pulmonary/Chest: Effort normal and breath sounds normal. No respiratory  distress. She has no wheezes. She has no rhonchi. She has no rales. She exhibits no tenderness and no crepitus.  Abdominal: Soft. Normal appearance and bowel sounds are normal. She exhibits no distension. There is tenderness in the right upper quadrant. There is no rebound and no guarding.    Musculoskeletal: Normal range of motion. She exhibits no edema or tenderness.  Moves all extremities well.   Neurological: She is alert and oriented to person, place, and time. She has normal strength. No cranial nerve deficit.  Skin: Skin is warm, dry and intact. No rash noted. No erythema. No pallor.  Psychiatric: She has a normal mood and affect. Her speech is normal and behavior is normal. Her mood appears not anxious.  Nursing note and vitals reviewed.    ED Treatments / Results  Labs (all labs ordered are listed, but only abnormal results are displayed) Results for orders placed or performed during the hospital encounter of 11/10/17  Lipase, blood  Result Value Ref Range   Lipase 32 11 - 51 U/L  Comprehensive metabolic panel  Result Value Ref Range   Sodium 140 135 - 145 mmol/L   Potassium 4.0 3.5 - 5.1 mmol/L   Chloride 102 101 - 111 mmol/L   CO2 29 22 - 32 mmol/L   Glucose, Bld 109 (H) 65 - 99 mg/dL   BUN 14 6 - 20 mg/dL   Creatinine, Ser  1.05 (H) 0.44 - 1.00 mg/dL   Calcium 9.2 8.9 - 10.3 mg/dL   Total Protein 6.9 6.5 - 8.1 g/dL   Albumin 3.9 3.5 - 5.0 g/dL   AST 118 (H) 15 - 41 U/L   ALT 54 14 - 54 U/L   Alkaline Phosphatase 240 (H) 38 - 126 U/L   Total Bilirubin 0.9 0.3 - 1.2 mg/dL   GFR calc non Af Amer 58 (L) >60 mL/min   GFR calc Af Amer >60 >60 mL/min   Anion gap 9 5 - 15  CBC  Result Value Ref Range   WBC 15.7 (H) 4.0 - 10.5 K/uL   RBC 4.92 3.87 - 5.11 MIL/uL   Hemoglobin 13.8 12.0 - 15.0 g/dL   HCT 43.2 36.0 - 46.0 %   MCV 87.8 78.0 - 100.0 fL   MCH 28.0 26.0 - 34.0 pg   MCHC 31.9 30.0 - 36.0 g/dL   RDW 14.2 11.5 - 15.5 %   Platelets 316 150 - 400 K/uL  Urinalysis, Routine w reflex microscopic  Result Value Ref Range   Color, Urine AMBER (A) YELLOW   APPearance CLEAR CLEAR   Specific Gravity, Urine 1.016 1.005 - 1.030   pH 8.0 5.0 - 8.0   Glucose, UA NEGATIVE NEGATIVE mg/dL   Hgb urine dipstick NEGATIVE NEGATIVE   Bilirubin Urine NEGATIVE NEGATIVE   Ketones, ur NEGATIVE NEGATIVE mg/dL   Protein, ur NEGATIVE NEGATIVE mg/dL   Nitrite NEGATIVE NEGATIVE   Leukocytes, UA TRACE (A) NEGATIVE   RBC / HPF 0-5 0 - 5 RBC/hpf   WBC, UA 0-5 0 - 5 WBC/hpf   Bacteria, UA RARE (A) NONE SEEN   Squamous Epithelial / LPF 0-5 (A) NONE SEEN  Pregnancy, urine  Result Value Ref Range   Preg Test, Ur NEGATIVE NEGATIVE   Laboratory interpretation all normal except for persistent leukocytosis that she has had for many years, she states they do not know why.  She has a new elevation of her SGOT that was not present before    EKG  EKG  Interpretation None       Radiology US Abdomen Limited Ruq  Result Date: 11/10/2017 CLINICAL DATA:  57 year old female with right upper quadrant abdominal pain, nausea. EXAM: ULTRASOUND ABDOMEN LIMITED RIGHT UPPER QUADRANT COMPARISON:  Abdominal radiograph dated 10/12/2017 FINDINGS: Gallbladder: Cholecystectomy Common bile duct: Diameter: 12 mm.  There is an 11 mm stone the central  CBD. Liver: Increased liver echogenicity may represent fatty infiltration or fibrosis. Clinical correlation is recommended. Portal vein is patent on color Doppler imaging with normal direction of blood flow towards the liver. IMPRESSION: 1. Cholecystectomy. Retained stone in the central CBD. MRCP may provide better evaluation. 2. Probable fatty liver. 3. Patent main portal vein with hepatopetal flow. Electronically Signed   By: Anner Crete M.D.   On: 11/10/2017 05:06    Procedures Procedures (including critical care time)  [place] Oct 26, 2016  Other Result Information  Acute Interface, Incoming Rad Results - 10/26/2017  4:06 PM EST TECHNIQUE: Complete abdominal ultrasound. COMPARISON: None.   INDICATION: Epigastric pain.  Elevated liver function tests.  FINDINGS:  PANCREAS:  No focal abnormalities are identified. Visualization is limited.  VASCULATURE: No abdominal aortic aneurysm. Visualized IVC is patent. Portal vein is patent with normal flow direction.  HEPATOBILIARY: Liver: Normal.   Gallbladder: Prior cholecystectomy CBD: Common hepatic duct is normal in caliber. However, there is moderate dilatation of the common bile duct which measures 10 mm in diameter. There are echogenic stones in the common bile duct. No intrahepatic biliary ductal dilatation.  KIDNEYS: Both kidneys are normal in size. No hydronephrosis. No suspicious masses. Normal renal echotexture.  SPLEEN: Size is within normal limits. No focal abnormality.  MISC: N./A.   IMPRESSION: Echogenic stones in the common bile duct which is dilated. However there is no intrahepatic biliary dilatation.  Electronically Signed by: Ross Marcus     Medications Ordered in ED Medications  oxyCODONE-acetaminophen (PERCOCET/ROXICET) 5-325 MG per tablet 1 tablet (1 tablet Oral Given 11/09/17 2015)  ondansetron (ZOFRAN-ODT) disintegrating tablet 4 mg (4 mg Oral Given 11/09/17 2015)  sodium chloride 0.9 % bolus  1,000 mL (0 mLs Intravenous Stopped 11/10/17 0628)  sodium chloride 0.9 % bolus 500 mL (0 mLs Intravenous Stopped 11/10/17 0628)  fentaNYL (SUBLIMAZE) injection 50 mcg (50 mcg Intravenous Given 11/10/17 0427)  ondansetron (ZOFRAN) injection 4 mg (4 mg Intravenous Given 11/10/17 0427)  fentaNYL (SUBLIMAZE) injection 50 mcg (50 mcg Intravenous Given 11/10/17 0657)     Initial Impression / Assessment and Plan / ED Course  I have reviewed the triage vital signs and the nursing notes.  Pertinent labs & imaging results that were available during my care of the patient were reviewed by me and considered in my medical decision making (see chart for details).     IV was inserted, patient was given IV fluids, IV pain and nausea medication.  Ultrasound was ordered to see if her obstruction is looking worse.  Recheck at 6 AM patient states her pain is better, she is using her iPad in no distress.  However she states she still having discomfort.  We discussed her ultrasound results.  She states this was not the worst episode, the worst episode was 3 or 4 weeks ago.  She states this was the second worse.  7:37 AM Dr. Havery Moros, gastroenterologist recommends patient be admitted and to go ahead and have her intervention done since she is starting to come to the ED for painful crisis.  8:02 AM Shanon Brow, hospitalist, will admit.  Review of the [place] shows patient has only been getting temazepam 50 mg tablets monthly from her PCP.  Final Clinical Impressions(s) / ED Diagnoses   Final diagnoses:  Common bile duct calculus    Plan admission  Rolland Porter, MD, Barbette Or, MD 11/10/17 [phone]

## 2017-11-10 NOTE — H&P (View-Only) (Signed)
Referring Provider: [facility]  Primary Care Physician:  Jani Gravel, MD Primary Gastroenterologist:   Zenovia Jarred,  MD  Reason for Consultation: CBD stones    ASSESSMENT AND PLAN:   57 yo female with RUQ pain, abnormal liver chemistries and CBD stones on U/S. She was scheduled for ERCP next week but presents to ED with worsening pain, rising alk phos and leukocytosis.  -Will proceed with ERCP with stones extraction today. Procedure will be done by Dr. Henrene Pastor with general anesthesia. . The benefits and risks of the procedure were discussed with the patient and she agrees to proceed. Keep NPO.      HPI: Selena Farrell is a 57 y.o. female known remotely to Dr. Hilarie Fredrickson. 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  She is status post laparoscopic cholecystectomy for chronic cholecystitis in 2014. We had not seen in several years until a couple of weeks ago when she presented to office with RUQ pain / abnormal LFTs / and u/s revealing CBD stones. She was non-toxic appearing. Repeat LFTs remarkable for elevated alk phos, o/w normal. ERCP was scheduled for next week. Patient advised to go to ED in interim if she had worsening pain / fevers.   Studies have temps in the 99 range.  Her right upper quadrant pain has progressed.  The pain is constant, radiates through to her back and is associated with nausea without vomiting.  Pain feels similar to years ago when she had biliary problems. No NSAID use. No blood in stools or black stools  Past Medical History:  Diagnosis Date  . Alcohol dependence ([place])    Quit Sept. 2012   . Bronchitis   . Choledocholithiasis 06/23/2011   ERCP, shincterotomy and baloon stone extraction by Dr Fuller Plan.   . Cough 07/29/2013  . Delayed gastric emptying   . Depression   . External hemorrhoid   . Gastric ulcer 05/2011   EGD-Dr. Hilarie Fredrickson   . Gastritis 05/2011   EGD-Dr.  Hilarie Fredrickson   . GERD (gastroesophageal reflux disease)   . Helicobacter pylori (H. pylori) infection    Hx of   . Hyperplastic colon polyp   . Hypertension   . Hyponatremia   . Internal hemorrhoids   . Nicotine dependence   . Tenosynovitis    right MC joint    Past Surgical History:  Procedure Laterality Date  . APPENDECTOMY    . cervical neck fusion  03/2004   C5-C6 anterior discectomy and fusion with allograft  . CESAREAN SECTION     x1  . CHOLECYSTECTOMY N/A 11/29/2012   Procedure: LAPAROSCOPIC CHOLECYSTECTOMY WITH INTRAOPERATIVE CHOLANGIOGRAM;  Surgeon: Earnstine Regal, MD;  Location: [facility];  Service: General;  Laterality: N/A;  . ERCP W/ SPHICTEROTOMY  06/23/2011   ERCP, shincterotomy and baloon stone extraction by Dr Fuller Plan. 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   . ESOPHAGOGASTRODUODENOSCOPY N/A 11/28/2012   Procedure: ESOPHAGOGASTRODUODENOSCOPY (EGD);  Surgeon: Jerene Bears, MD;  Location: [facility];  Service: Gastroenterology;  Laterality: N/A;    Prior to Admission medications   Medication Sig Start Date End Date Taking? Authorizing Provider  atenolol (TENORMIN) 25 MG tablet Take 1 tablet (25 mg total) by mouth daily. For High blood pressure 01/01/14  Yes Nwoko, Agnes I, NP  gabapentin (NEURONTIN) 300 MG capsule Take 1 capsule (300 mg total) by mouth 3 (three) times daily. For substance  withdrawal syndrome/pain management 01/01/14  Yes Lindell Spar I, NP  hydrOXYzine (ATARAX/VISTARIL) 25 MG tablet Take 25 mg (1 tablet) three times daily as needed for anxiety 01/01/14  Yes Nwoko, Herbert Pun I, NP  metoCLOPramide (REGLAN) 5 MG tablet Take 1 tablet (5 mg) three times daily & 2 tablets (10 mg) at bedtime: Acid reflux 01/01/14  Yes Nwoko, Herbert Pun I, NP  omeprazole (PRILOSEC) 20 MG capsule TAKE 2 CAPSULES BY MOUTH EVERY DAY   Yes Pyrtle, Lajuan Lines, MD  ondansetron (ZOFRAN-ODT) 4 MG disintegrating tablet Take 1 tablet by mouth every 6 (six) hours as needed for nausea.  11/03/17  Yes  [provider]  temazepam (RESTORIL) 30 MG capsule Take 1 capsule (30 mg total) by mouth at bedtime as needed for sleep. 01/01/14  Yes Lindell Spar I, NP  traZODone (DESYREL) 150 MG tablet Take 150 mg by mouth at bedtime as needed for sleep.  11/06/17  Yes [provider]  citalopram (CELEXA) 40 MG tablet Take 1 tablet (40 mg total) by mouth daily. For depression Patient not taking: Reported on 11/10/2017 01/01/14   Lindell Spar I, NP  docusate sodium (COLACE) 100 MG capsule Take 1 capsule (100 mg total) by mouth daily as needed for mild constipation. Patient not taking: Reported on 11/10/2017 01/01/14   Lindell Spar I, NP  lidocaine (LIDODERM) 5 % Place 1 patch onto the skin daily. Remove & Discard patch within 12 hours or as directed by MD: For pain Patient not taking: Reported on 11/10/2017 01/01/14   Lindell Spar I, NP  nicotine (NICODERM CQ - DOSED IN MG/24 HOURS) 21 mg/24hr patch Place 1 patch (21 mg total) onto the skin daily. For Nicotine addiction Patient not taking: Reported on 10/27/2017 01/01/14   Lindell Spar I, NP    Current Facility-Administered Medications  Medication Dose Route Frequency Provider Last Rate Last Dose  . 0.9 %  sodium chloride infusion   Intravenous Continuous Derrill Kay A, MD      . atenolol (TENORMIN) tablet 25 mg  25 mg Oral Daily Derrill Kay A, MD      . gabapentin (NEURONTIN) capsule 300 mg  300 mg Oral TID Phillips Grout, MD      . ondansetron (ZOFRAN) tablet 4 mg  4 mg Oral Q6H PRN Phillips Grout, MD       Or  . ondansetron (ZOFRAN) injection 4 mg  4 mg Intravenous Q6H PRN Phillips Grout, MD      . ondansetron (ZOFRAN-ODT) disintegrating tablet 4 mg  4 mg Oral Q6H PRN Phillips Grout, MD      . pantoprazole (PROTONIX) EC tablet 40 mg  40 mg Oral Daily David, Rachal A, MD      . temazepam (RESTORIL) capsule 30 mg  30 mg Oral QHS PRN Phillips Grout, MD      . traZODone (DESYREL) tablet 150 mg  150 mg Oral QHS PRN Phillips Grout, MD        Current Outpatient Medications  Medication Sig Dispense Refill  . atenolol (TENORMIN) 25 MG tablet Take 1 tablet (25 mg total) by mouth daily. For High blood pressure    . gabapentin (NEURONTIN) 300 MG capsule Take 1 capsule (300 mg total) by mouth 3 (three) times daily. For substance withdrawal syndrome/pain management 90 capsule 0  . hydrOXYzine (ATARAX/VISTARIL) 25 MG tablet Take 25 mg (1 tablet) three times daily as needed for anxiety 90 tablet 0  . metoCLOPramide (REGLAN) 5 MG tablet  Take 1 tablet (5 mg) three times daily & 2 tablets (10 mg) at bedtime: Acid reflux 150 tablet 0  . omeprazole (PRILOSEC) 20 MG capsule TAKE 2 CAPSULES BY MOUTH EVERY DAY 60 capsule 2  . ondansetron (ZOFRAN-ODT) 4 MG disintegrating tablet Take 1 tablet by mouth every 6 (six) hours as needed for nausea.   1  . temazepam (RESTORIL) 30 MG capsule Take 1 capsule (30 mg total) by mouth at bedtime as needed for sleep. 14 capsule 0  . traZODone (DESYREL) 150 MG tablet Take 150 mg by mouth at bedtime as needed for sleep.   5  . citalopram (CELEXA) 40 MG tablet Take 1 tablet (40 mg total) by mouth daily. For depression (Patient not taking: Reported on 11/10/2017) 30 tablet 0  . docusate sodium (COLACE) 100 MG capsule Take 1 capsule (100 mg total) by mouth daily as needed for mild constipation. (Patient not taking: Reported on 11/10/2017) 10 capsule 0  . lidocaine (LIDODERM) 5 % Place 1 patch onto the skin daily. Remove & Discard patch within 12 hours or as directed by MD: For pain (Patient not taking: Reported on 11/10/2017) 5 patch 0  . nicotine (NICODERM CQ - DOSED IN MG/24 HOURS) 21 mg/24hr patch Place 1 patch (21 mg total) onto the skin daily. For Nicotine addiction (Patient not taking: Reported on 10/27/2017) 28 patch 0    Allergies as of 11/09/2017 - Review Complete 11/09/2017  Allergen Reaction Noted  . Aspirin Other (See Comments) 06/14/2011  . Celebrex [celecoxib] Nausea Only 09/17/2013  . Nsaids Nausea Only  09/17/2013  . Vicodin [hydrocodone-acetaminophen] Nausea And Vomiting 06/14/2011    Family History  Problem Relation Age of Onset  . Alcohol abuse Mother   . Stroke Father   . Cancer Maternal Grandfather        prostate  . Aneurysm Sister   . Colon cancer Neg Hx     Social History   Socioeconomic History  . Marital status: Widowed    Spouse name: Not on file  . Number of children: 1  . Years of education: GED  . Highest education level: Not on file  Social Needs  . Financial resource strain: Not on file  . Food insecurity - worry: Not on file  . Food insecurity - inability: Not on file  . Transportation needs - medical: Not on file  . 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 Smoking status: Current Every Day Smoker    Packs/day: 0.50    Years: 25.00    Pack years: 12.50    Types: Cigarettes  . Smokeless tobacco: Never Used  . Tobacco comment: Counseling to quit smoking given to patient in exam room   Substance and Sexual Activity  . Alcohol use: No    Comment: Heavy drinker quit  Sept 2012   . Drug use: No  . Sexual activity: No    Birth control/protection: IUD  Other Topics Concern  . Not on file  Social History Narrative   Patient is widowed.   Patient is living alone.   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   Patient drinks 3-4 cups of caffeine daily.   Patient has her GED.   Patient is right-handed.    Review of Systems: All systems reviewed and negative except where noted in HPI.  Physical Exam: Vital signs in last 24  hours: Temp:  [98 F (36.7 C)-99.8 F (37.7 C)] 98 F (36.7 C) (03/01 0656) Pulse Rate:  [68-79] 78 (03/01 0908) Resp:  [18-20] 18 (03/01 0908) BP: (108-149)/(61-78) 112/68 (03/01 0908) SpO2:  [95 %-99 %] 96 % (03/01 0908)   General:   Alert, well-developed, white female in NAD Psych:  Pleasant, cooperative.  Normal mood and affect. Eyes:  Pupils equal, sclera clear, no icterus.   Conjunctiva pink. Ears:  Normal auditory acuity. Nose:  No deformity, discharge,  or lesions. Neck:  Supple; no masses Lungs:  Clear throughout to auscultation.   No wheezes, crackles, or rhonchi.  Heart:  Regular rate and rhythm; no murmurs, no edema Abdomen:  Soft, non-distended, mild RUQ tenderness, BS active, no palp mass    Rectal:  Deferred  Msk:  Symmetrical without gross deformities. . Pulses:  Normal pulses noted. Neurologic:  Alert and  oriented x4;  grossly normal neurologically. Skin:  Intact without significant lesions or rashes..   Intake/Output from previous day: No intake/output data recorded. Intake/Output this shift: No intake/output data recorded.  Lab Results: Recent Labs    11/09/17 2023  WBC 15.7*  HGB 13.8  HCT 43.2  PLT 316   BMET Recent Labs    11/09/17 2023  NA 140  K 4.0  CL 102  CO2 29  GLUCOSE 109*  BUN 14  CREATININE 1.05*  CALCIUM 9.2   LFT Recent Labs    11/09/17 2023  PROT 6.9  ALBUMIN 3.9  AST 118*  ALT 54  ALKPHOS 240*  BILITOT 0.9    Studies/Results: US Abdomen Limited Ruq  Result Date: 11/10/2017 CLINICAL DATA:  57 year old female with right upper quadrant abdominal pain, nausea. EXAM: ULTRASOUND ABDOMEN LIMITED RIGHT UPPER QUADRANT COMPARISON:  Abdominal radiograph dated 10/12/2017 FINDINGS: Gallbladder: Cholecystectomy Common bile duct: Diameter: 12 mm.  There is an 11 mm stone the central CBD. Liver: Increased liver echogenicity may represent fatty infiltration or fibrosis. Clinical correlation is recommended. Portal vein is patent on color Doppler imaging with normal direction of blood flow towards the liver. IMPRESSION: 1. Cholecystectomy. Retained stone in the central CBD. MRCP may provide better evaluation. 2. Probable fatty liver. 3. Patent main portal vein with hepatopetal flow. Electronically Signed   By: Anner Crete M.D.   On: 11/10/2017  05:06     Tye Savoy, NP-C @  11/10/2017, 9:40 AM  Pager number [phone]   Attending physician's note   I have taken a history, examined the patient and reviewed the chart. I agree with the Advanced Practitioner's note, impression and recommendations. 57 year old female with obesity status post cholecystectomy with retained CBD stone presented with worsening right upper quadrant pain, low-grade fever and leukocytosis concerning for cholangitis.  Plan for ERCP later today by Dr. Henrene Pastor for sphincterotomy and stone extraction Continue antibiotics, currently on Unasyn Further recommendations based on ERCP findings  K Denzil Magnuson, MD [phone] Mon-Fri 8a-5p [phone] after 5p, weekends, holidays

## 2017-11-10 NOTE — H&P (Signed)
History and Physical    Selena Farrell IRW:[id number] DOB: 1960-09-18 DOA: 11/10/2017  PCP: Jani Gravel, MD  Patient coming from: Home  Chief Complaint: Abdominal pain  HPI: Selena Farrell is a 57 y.o. female with medical history significant of status post cholecystectomy with a known common bile duct stone and dilation as scheduled next week to see Dr. Ardis Hughs for ERCP as an outpatient.  Patient's right upper quadrant abdominal pain has been getting worse over the last several days so she was advised to come to the emergency department for ERCP sooner rather than later.  She denies any fevers.  Her abdominal pain gets worse after she eats.  She has not had any diarrhea.  She denies any nausea or vomiting.  GI is called who will be seeing the patient to arrange ERCP either later today or in the morning.  Patient is being referred for admission for common bile duct stone and dilation.   Review of Systems: As per HPI otherwise 10 point review of systems negative.   Past Medical History:  Diagnosis Date  . Alcohol dependence ([place])    Quit Sept. 2012   . Bronchitis   . Choledocholithiasis 06/23/2011   ERCP, shincterotomy and baloon stone extraction by Dr Fuller Plan.   . Cough 07/29/2013  . Delayed gastric emptying   . Depression   . External hemorrhoid   . Gastric ulcer 05/2011   EGD-Dr. Hilarie Fredrickson   . Gastritis 05/2011   EGD-Dr. Hilarie Fredrickson   . GERD (gastroesophageal reflux disease)   . Helicobacter pylori (H. pylori) infection    Hx of   . Hyperplastic colon polyp   . Hypertension   . Hyponatremia   . Internal hemorrhoids   . Nicotine dependence   . Tenosynovitis    right MC joint    Past Surgical History:  Procedure Laterality Date  . APPENDECTOMY    . cervical neck fusion  03/2004   C5-C6 anterior discectomy and fusion with allograft  . CESAREAN SECTION     x1  . CHOLECYSTECTOMY N/A 11/29/2012   Procedure: LAPAROSCOPIC CHOLECYSTECTOMY WITH INTRAOPERATIVE CHOLANGIOGRAM;  Surgeon: Earnstine Regal, MD;  Location: [facility];  Service: General;  Laterality: N/A;  . ERCP W/ SPHICTEROTOMY  06/23/2011   ERCP, shincterotomy and baloon stone extraction by Dr Fuller Plan. 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   . ESOPHAGOGASTRODUODENOSCOPY N/A 11/28/2012   Procedure: ESOPHAGOGASTRODUODENOSCOPY (EGD);  Surgeon: Jerene Bears, MD;  Location: [facility];  Service: Gastroenterology;  Laterality: N/A;     reports that she has been smoking cigarettes.  She has a 12.50 pack-year smoking history. she has never used smokeless tobacco. She reports that she does not drink alcohol or use drugs.  Allergies  Allergen Reactions  . Aspirin Other (See Comments)    Pt has bleeding ulcer, not allowed to take aspirin  . Celebrex [Celecoxib] Nausea Only  . Nsaids Nausea Only  . Vicodin [Hydrocodone-Acetaminophen] Nausea And Vomiting    Can take with antiemetics.    Family History  Problem Relation Age of Onset  . Alcohol abuse Mother   . Stroke Father   . Cancer Maternal Grandfather        prostate  . Aneurysm Sister   . Colon cancer Neg Hx     Prior to Admission medications   Medication Sig Start Date End Date Taking? Authorizing Provider  atenolol (TENORMIN) 25 MG tablet Take 1 tablet (25 mg total) by mouth daily. For High blood pressure 01/01/14  Yes Lindell Spar I, NP  gabapentin (NEURONTIN) 300 MG capsule Take 1 capsule (300 mg total) by mouth 3 (three) times daily. For substance withdrawal syndrome/pain management 01/01/14  Yes Lindell Spar I, NP  hydrOXYzine (ATARAX/VISTARIL) 25 MG tablet Take 25 mg (1 tablet) three times daily as needed for anxiety 01/01/14  Yes Nwoko, Herbert Pun I, NP  metoCLOPramide (REGLAN) 5 MG tablet Take 1 tablet (5 mg) three times daily & 2 tablets (10 mg) at bedtime: Acid reflux 01/01/14  Yes Nwoko, Herbert Pun I, NP  omeprazole (PRILOSEC) 20 MG capsule TAKE 2 CAPSULES BY MOUTH EVERY DAY   Yes Pyrtle, Lajuan Lines, MD  ondansetron (ZOFRAN-ODT) 4 MG disintegrating tablet Take 1  tablet by mouth every 6 (six) hours as needed for nausea.  11/03/17  Yes [provider]  temazepam (RESTORIL) 30 MG capsule Take 1 capsule (30 mg total) by mouth at bedtime as needed for sleep. 01/01/14  Yes Lindell Spar I, NP  traZODone (DESYREL) 150 MG tablet Take 150 mg by mouth at bedtime as needed for sleep.  11/06/17  Yes [provider]  citalopram (CELEXA) 40 MG tablet Take 1 tablet (40 mg total) by mouth daily. For depression Patient not taking: Reported on 11/10/2017 01/01/14   Lindell Spar I, NP  docusate sodium (COLACE) 100 MG capsule Take 1 capsule (100 mg total) by mouth daily as needed for mild constipation. Patient not taking: Reported on 11/10/2017 01/01/14   Lindell Spar I, NP  lidocaine (LIDODERM) 5 % Place 1 patch onto the skin daily. Remove & Discard patch within 12 hours or as directed by MD: For pain Patient not taking: Reported on 11/10/2017 01/01/14   Lindell Spar I, NP  nicotine (NICODERM CQ - DOSED IN MG/24 HOURS) 21 mg/24hr patch Place 1 patch (21 mg total) onto the skin daily. For Nicotine addiction Patient not taking: Reported on 10/27/2017 01/01/14   Lindell Spar I, NP    Physical Exam: Vitals:   11/10/17 0008 11/10/17 0354 11/10/17 0454 11/10/17 0656  BP: 129/76 119/64  108/61  Pulse: 70 79  79  Resp: 18 19  20   Temp:  99.8 F (37.7 C)  98 F (36.7 C)  TempSrc:    Oral  SpO2: 99% 98% 99% 95%      Constitutional: NAD, calm, comfortable Vitals:   11/10/17 0008 11/10/17 0354 11/10/17 0454 11/10/17 0656  BP: 129/76 119/64  108/61  Pulse: 70 79  79  Resp: 18 19  20   Temp:  99.8 F (37.7 C)  98 F (36.7 C)  TempSrc:    Oral  SpO2: 99% 98% 99% 95%   Eyes: PERRL, lids and conjunctivae normal ENMT: Mucous membranes are moist. Posterior pharynx clear of any exudate or lesions.Normal dentition.  Neck: normal, supple, no masses, no thyromegaly Respiratory: clear to auscultation bilaterally, no wheezing, no crackles. Normal respiratory effort. No  accessory muscle use.  Cardiovascular: Regular rate and rhythm, no murmurs / rubs / gallops. No extremity edema. 2+ pedal pulses. No carotid bruits.  Abdomen: Right upper quadrant tenderness, no masses palpated. No hepatosplenomegaly. Bowel sounds positive.  Musculoskeletal: no clubbing / cyanosis. No joint deformity upper and lower extremities. Good ROM, no contractures. Normal muscle tone.  Skin: no rashes, lesions, ulcers. No induration Neurologic: CN 2-12 grossly intact. Sensation intact, DTR normal. Strength 5/5 in all 4.  Psychiatric: Normal judgment and insight. Alert and oriented x 3. Normal mood.    Labs on Admission: I have personally reviewed following labs and  imaging studies  CBC: Recent Labs  Lab 11/09/17 2023  WBC 15.7*  HGB 13.8  HCT 43.2  MCV 87.8  PLT 294   Basic Metabolic Panel: Recent Labs  Lab 11/09/17 2023  NA 140  K 4.0  CL 102  CO2 29  GLUCOSE 109*  BUN 14  CREATININE 1.05*  CALCIUM 9.2   GFR: Estimated Creatinine Clearance: 61.1 mL/min (A) (by C-G formula based on SCr of 1.05 mg/dL (H)). Liver Function Tests: Recent Labs  Lab 11/09/17 2023  AST 118*  ALT 54  ALKPHOS 240*  BILITOT 0.9  PROT 6.9  ALBUMIN 3.9   Recent Labs  Lab 11/09/17 2023  LIPASE 32   No results for input(s): AMMONIA in the last 168 hours. Coagulation Profile: No results for input(s): INR, PROTIME in the last 168 hours. Cardiac Enzymes: No results for input(s): CKTOTAL, CKMB, CKMBINDEX, TROPONINI in the last 168 hours. BNP (last 3 results) No results for input(s): PROBNP in the last 8760 hours. HbA1C: No results for input(s): HGBA1C in the last 72 hours. CBG: No results for input(s): GLUCAP in the last 168 hours. Lipid Profile: No results for input(s): CHOL, HDL, LDLCALC, TRIG, CHOLHDL, LDLDIRECT in the last 72 hours. Thyroid Function Tests: No results for input(s): TSH, T4TOTAL, FREET4, T3FREE, THYROIDAB in the last 72 hours. Anemia Panel: No results for  input(s): VITAMINB12, FOLATE, FERRITIN, TIBC, IRON, RETICCTPCT in the last 72 hours. Urine analysis:    Component Value Date/Time   COLORURINE AMBER (A) 11/10/2017 0637   APPEARANCEUR CLEAR 11/10/2017 0637   LABSPEC 1.016 11/10/2017 0637   PHURINE 8.0 11/10/2017 0637   GLUCOSEU NEGATIVE 11/10/2017 0637   GLUCOSEU NEGATIVE 07/04/2011 1516   HGBUR NEGATIVE 11/10/2017 0637   BILIRUBINUR NEGATIVE 11/10/2017 0637   KETONESUR NEGATIVE 11/10/2017 0637   PROTEINUR NEGATIVE 11/10/2017 0637   UROBILINOGEN 0.2 12/27/2013 1433   NITRITE NEGATIVE 11/10/2017 0637   LEUKOCYTESUR TRACE (A) 11/10/2017 0637   Sepsis Labs: !!!!!!!!!!!!!!!!!!!!!!!!!!!!!!!!!!!!!!!!!!!! @LABRCNTIP (procalcitonin:4,lacticidven:4) )No results found for this or any previous visit (from the past 240 hour(s)).   Radiological Exams on Admission: US Abdomen Limited Ruq  Result Date: 11/10/2017 CLINICAL DATA:  57 year old female with right upper quadrant abdominal pain, nausea. EXAM: ULTRASOUND ABDOMEN LIMITED RIGHT UPPER QUADRANT COMPARISON:  Abdominal radiograph dated 10/12/2017 FINDINGS: Gallbladder: Cholecystectomy Common bile duct: Diameter: 12 mm.  There is an 11 mm stone the central CBD. Liver: Increased liver echogenicity may represent fatty infiltration or fibrosis. Clinical correlation is recommended. Portal vein is patent on color Doppler imaging with normal direction of blood flow towards the liver. IMPRESSION: 1. Cholecystectomy. Retained stone in the central CBD. MRCP may provide better evaluation. 2. Probable fatty liver. 3. Patent main portal vein with hepatopetal flow. Electronically Signed   By: Anner Crete M.D.   On: 11/10/2017 05:06    Old chart reviewed Case discussed with Dr. Wynona Canes  Assessment/Plan 57 year old female Status post cholecystectomy with stone in the common bile duct Principal Problem:   Common bile duct (CBD) obstruction-no signs or symptoms of ascending cholangitis.  GI has been  consulted who will arrange ERCP at some point.  Keep n.p.o. until she seen by GI service.  Observation on medical.  Patient stable.  Afebrile vital signs stable.  Active Problems:   Bile duct stone-ERCP as above   PUD (peptic ulcer disease)-continue Protonix   Abdominal pain-due to the above   S/P cholecystectomy-noted     DVT prophylaxis: SCDs Code Status: Full Family Communication: None Disposition Plan:  Per day team Consults called: GI Admission status: Observation   Haston Casebolt A MD [facility]  If 7PM-7AM, please contact night-coverage [email or web address] Password [email or web address]  11/10/2017, 8:19 AM

## 2017-11-10 NOTE — Anesthesia Procedure Notes (Signed)
Procedure Name: Intubation Date/Time: 11/10/2017 1:02 PM Performed by: Victoriano Lain, CRNA Pre-anesthesia Checklist: Patient identified, Emergency Drugs available, Suction available, Patient being monitored and Timeout performed Patient Re-evaluated:Patient Re-evaluated prior to induction Oxygen Delivery Method: Circle system utilized Preoxygenation: Pre-oxygenation with 100% oxygen Induction Type: IV induction Ventilation: Mask ventilation without difficulty Laryngoscope Size: Mac and 4 Grade View: Grade I Tube type: Oral Tube size: 7.5 mm Number of attempts: 1 Airway Equipment and Method: Stylet Placement Confirmation: ETT inserted through vocal cords under direct vision,  positive ETCO2 and breath sounds checked- equal and bilateral Secured at: 22 cm Tube secured with: Tape Dental Injury: Teeth and Oropharynx as per pre-operative assessment

## 2017-11-10 NOTE — Op Note (Signed)
[facility] Patient Name: Selena Farrell Procedure Date: 11/10/2017 MRN: [id number] Attending MD: Docia Chuck. Henrene Pastor , MD Date of Birth: 1961/09/02 CSN: [id number] Age: 57 Admit Type: Inpatient Procedure:                ERCP with sphincterotomy and common duct stone                            extraction Indications:              Bile duct stone(s), For therapy of bile duct                            stone(s) Providers:                Docia Chuck. Henrene Pastor, MD, Zenon Mayo, RN, Elspeth Cho                            Tech., Technician, Courtney Heys. [place], CRNA Referring MD:              Medicines:                General Anesthesia Complications:            No immediate complications. Estimated Blood Loss:     Estimated blood loss: none. Procedure:                Pre-Anesthesia Assessment:                           - Prior to the procedure, a History and Physical                            was performed, and patient medications and                            allergies were reviewed. The patient is competent.                            The risks and benefits of the procedure and the                            sedation options and risks were discussed with the                            patient. All questions were answered and informed                            consent was obtained. Patient identification and                            proposed procedure were verified by the physician.                            Mental Status Examination: alert and oriented.  Airway Examination: normal oropharyngeal airway and                            neck mobility. Respiratory Examination: clear to                            auscultation. CV Examination: normal. Prophylactic                            Antibiotics: The patient does not require                            prophylactic antibiotics. Prior Anticoagulants: The                            patient has taken no  previous anticoagulant or                            antiplatelet agents. ASA Grade Assessment: II - A                            patient with mild systemic disease. After reviewing                            the risks and benefits, the patient was deemed in                            satisfactory condition to undergo the procedure.                            The anesthesia plan was to use moderate sedation /                            analgesia (conscious sedation). Immediately prior                            to administration of medications, the patient was                            re-assessed for adequacy to receive sedatives. The                            heart rate, respiratory rate, oxygen saturations,                            blood pressure, adequacy of pulmonary ventilation,                            and response to care were monitored throughout the                            procedure. The physical status of the patient was  re-assessed after the procedure.                           After obtaining informed consent, the scope was                            passed under direct vision. Throughout the                            procedure, the patient's blood pressure, pulse, and                            oxygen saturations were monitored continuously. The                            [id number] scope was introduced through the                            mouth, and used to inject contrast into and used to                            inject contrast into the bile duct. The ERCP was                            accomplished without difficulty. The patient                            tolerated the procedure well. Scope In: Scope Out: Findings:      The esophagus was successfully intubated under direct vision. The scope       was advanced to a normal major papilla in the descending duodenum       without detailed examination of the pharynx, larynx and  associated       structures, and upper GI tract. The upper GI tract was grossly normal.       There was a periampullary diverticulum. The bile duct was deeply       cannulated with the traction (standard) sphincterotome. Contrast was       injected. I personally interpreted the bile duct images. There was brisk       flow of contrast through the ducts. Image quality was excellent.       Contrast extended to the entire biliary tree. Opacification of the       entire biliary tree except for the gallbladder was successful. The       middle third of the main bile duct contained three stones, the largest       of which was 12 mm in diameter. The main bile duct was moderately       dilated, with a stone causing an obstruction. A cholecystectomy had been       performed. A 0.035 inch straight standard wire was passed into the       biliary tree. Biliary sphincterotomy was made (extension of prior) with       a traction (standard) sphincterotome using ERBE electrocautery. There       was no post-sphincterotomy bleeding. The biliary tree was swept with a       15 mm balloon  starting at the bifurcation. The bile was slightly       purulent. All stones were removed. No stones remained. Excellent       drainage. Impression:               - The entire main bile duct was moderately dilated,                            with stones causing an obstruction.                           - The patient has had a cholecystectomy.                           - Choledocholithiasis was found                           - A biliary sphincterotomy was performed.                           - The biliary tree was swept with successful                            removal of all stones and excellent drainage. Moderate Sedation:      none Recommendation:           1. Standard post ERCP observation and care                            including indomethacin suppositories and IV fluids                           2. Clear liquid  diet. Advance as tolerated                           3. Case discussed with patient, sister, and                            inpatient GI team. Inpatient GI team to follow the                            patient during her hospitalization.                           4. Continue antibiotics through today                           5. Would anticipate discharge tomorrow if doing                            well. Procedure Code(s):        --- Professional ---                           [phone], Endoscopic retrograde  cholangiopancreatography (ERCP); with removal of                            calculi/debris from biliary/pancreatic duct(s)                           43262, Endoscopic retrograde                            cholangiopancreatography (ERCP); with                            sphincterotomy/papillotomy Diagnosis Code(s):        --- Professional ---                           K80.51, Calculus of bile duct without cholangitis                            or cholecystitis with obstruction                           Z90.49, Acquired absence of other specified parts                            of digestive tract CPT copyright 2016 [employer]. All rights reserved. The codes documented in this report are preliminary and upon coder review may  be revised to meet current compliance requirements. Docia Chuck. Henrene Pastor, MD 11/10/2017 2:03:11 PM This report has been signed electronically. Number of Addenda: 0

## 2017-11-10 NOTE — Transfer of Care (Signed)
Immediate Anesthesia Transfer of Care Note  Patient: Selena Farrell  Procedure(s) Performed: ENDOSCOPIC RETROGRADE CHOLANGIOPANCREATOGRAPHY (ERCP) (N/A )  Patient Location: PACU and Endoscopy Unit  Anesthesia Type:General  Level of Consciousness: awake, alert , oriented and patient cooperative  Airway & Oxygen Therapy: Patient Spontanous Breathing and Patient connected to face mask oxygen  Post-op Assessment: Report given to RN, Post -op Vital signs reviewed and stable and Patient moving all extremities  Post vital signs: Reviewed and stable  Last Vitals:  Vitals:   11/10/17 1215 11/10/17 1407  BP: 140/72 (!) 155/67  Pulse: 66 81  Resp: 20 18  Temp: 37.1 C   SpO2: 97% 96%    Last Pain:  Vitals:   11/10/17 1215  TempSrc: Oral  PainSc: 8          Complications: No apparent anesthesia complications

## 2017-11-10 NOTE — Interval H&P Note (Signed)
History and Physical Interval Note:  11/10/2017 12:27 PM  [name]  has presented today for surgery, with the diagnosis of choledocholithiasis  The various methods of treatment have been discussed with the patient and family. After consideration of risks (including pancreatitis, bleeding, infection, failed cannulation), benefits and other options for treatment, the patient has consented to  Procedure(s): ENDOSCOPIC RETROGRADE CHOLANGIOPANCREATOGRAPHY (ERCP) (N/A) as a surgical intervention .  The patient's history has been reviewed, patient examined, no change in status, stable for surgery.  I have reviewed the patient's chart and labs.  Questions were answered to the patient's satisfaction.     Selena Farrell

## 2017-11-10 NOTE — Progress Notes (Signed)
MD returned to assess patient.  Throat pain level trending down, sipping on ginger ale tolerating well, Sats 90-94% on RA.  Continues to use Incentive spirometer.  Will transfer back to floor on 2L , continue IS.

## 2017-11-10 NOTE — Consult Note (Addendum)
Referring Provider: [facility]  Primary Care Physician:  Jani Gravel, MD Primary Gastroenterologist:   Zenovia Jarred,  MD  Reason for Consultation: CBD stones    ASSESSMENT AND PLAN:   57 yo female with RUQ pain, abnormal liver chemistries and CBD stones on U/S. She was scheduled for ERCP next week but presents to ED with worsening pain, rising alk phos and leukocytosis.  -Will proceed with ERCP with stones extraction today. Procedure will be done by Dr. Henrene Pastor with general anesthesia. . The benefits and risks of the procedure were discussed with the patient and she agrees to proceed. Keep NPO.      HPI: Selena Farrell is a 57 y.o. female known remotely to Dr. Hilarie Fredrickson. 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  She is status post laparoscopic cholecystectomy for chronic cholecystitis in 2014. We had not seen in several years until a couple of weeks ago when she presented to office with RUQ pain / abnormal LFTs / and u/s revealing CBD stones. She was non-toxic appearing. Repeat LFTs remarkable for elevated alk phos, o/w normal. ERCP was scheduled for next week. Patient advised to go to ED in interim if she had worsening pain / fevers.   Studies have temps in the 99 range.  Her right upper quadrant pain has progressed.  The pain is constant, radiates through to her back and is associated with nausea without vomiting.  Pain feels similar to years ago when she had biliary problems. No NSAID use. No blood in stools or black stools  Past Medical History:  Diagnosis Date  . Alcohol dependence ([place])    Quit Sept. 2012   . Bronchitis   . Choledocholithiasis 06/23/2011   ERCP, shincterotomy and baloon stone extraction by Dr Fuller Plan.   . Cough 07/29/2013  . Delayed gastric emptying   . Depression   . External hemorrhoid   . Gastric ulcer 05/2011   EGD-Dr. Hilarie Fredrickson   . Gastritis 05/2011   EGD-Dr.  Hilarie Fredrickson   . GERD (gastroesophageal reflux disease)   . Helicobacter pylori (H. pylori) infection    Hx of   . Hyperplastic colon polyp   . Hypertension   . Hyponatremia   . Internal hemorrhoids   . Nicotine dependence   . Tenosynovitis    right MC joint    Past Surgical History:  Procedure Laterality Date  . APPENDECTOMY    . cervical neck fusion  03/2004   C5-C6 anterior discectomy and fusion with allograft  . CESAREAN SECTION     x1  . CHOLECYSTECTOMY N/A 11/29/2012   Procedure: LAPAROSCOPIC CHOLECYSTECTOMY WITH INTRAOPERATIVE CHOLANGIOGRAM;  Surgeon: Earnstine Regal, MD;  Location: [facility];  Service: General;  Laterality: N/A;  . ERCP W/ SPHICTEROTOMY  06/23/2011   ERCP, shincterotomy and baloon stone extraction by Dr Fuller Plan. 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   . ESOPHAGOGASTRODUODENOSCOPY N/A 11/28/2012   Procedure: ESOPHAGOGASTRODUODENOSCOPY (EGD);  Surgeon: Jerene Bears, MD;  Location: [facility];  Service: Gastroenterology;  Laterality: N/A;    Prior to Admission medications   Medication Sig Start Date End Date Taking? Authorizing Provider  atenolol (TENORMIN) 25 MG tablet Take 1 tablet (25 mg total) by mouth daily. For High blood pressure 01/01/14  Yes Nwoko, Agnes I, NP  gabapentin (NEURONTIN) 300 MG capsule Take 1 capsule (300 mg total) by mouth 3 (three) times daily. For substance  withdrawal syndrome/pain management 01/01/14  Yes Lindell Spar I, NP  hydrOXYzine (ATARAX/VISTARIL) 25 MG tablet Take 25 mg (1 tablet) three times daily as needed for anxiety 01/01/14  Yes Nwoko, Herbert Pun I, NP  metoCLOPramide (REGLAN) 5 MG tablet Take 1 tablet (5 mg) three times daily & 2 tablets (10 mg) at bedtime: Acid reflux 01/01/14  Yes Nwoko, Herbert Pun I, NP  omeprazole (PRILOSEC) 20 MG capsule TAKE 2 CAPSULES BY MOUTH EVERY DAY   Yes Pyrtle, Lajuan Lines, MD  ondansetron (ZOFRAN-ODT) 4 MG disintegrating tablet Take 1 tablet by mouth every 6 (six) hours as needed for nausea.  11/03/17  Yes  [provider]  temazepam (RESTORIL) 30 MG capsule Take 1 capsule (30 mg total) by mouth at bedtime as needed for sleep. 01/01/14  Yes Lindell Spar I, NP  traZODone (DESYREL) 150 MG tablet Take 150 mg by mouth at bedtime as needed for sleep.  11/06/17  Yes [provider]  citalopram (CELEXA) 40 MG tablet Take 1 tablet (40 mg total) by mouth daily. For depression Patient not taking: Reported on 11/10/2017 01/01/14   Lindell Spar I, NP  docusate sodium (COLACE) 100 MG capsule Take 1 capsule (100 mg total) by mouth daily as needed for mild constipation. Patient not taking: Reported on 11/10/2017 01/01/14   Lindell Spar I, NP  lidocaine (LIDODERM) 5 % Place 1 patch onto the skin daily. Remove & Discard patch within 12 hours or as directed by MD: For pain Patient not taking: Reported on 11/10/2017 01/01/14   Lindell Spar I, NP  nicotine (NICODERM CQ - DOSED IN MG/24 HOURS) 21 mg/24hr patch Place 1 patch (21 mg total) onto the skin daily. For Nicotine addiction Patient not taking: Reported on 10/27/2017 01/01/14   Lindell Spar I, NP    Current Facility-Administered Medications  Medication Dose Route Frequency Provider Last Rate Last Dose  . 0.9 %  sodium chloride infusion   Intravenous Continuous Derrill Kay A, MD      . atenolol (TENORMIN) tablet 25 mg  25 mg Oral Daily Derrill Kay A, MD      . gabapentin (NEURONTIN) capsule 300 mg  300 mg Oral TID Phillips Grout, MD      . ondansetron (ZOFRAN) tablet 4 mg  4 mg Oral Q6H PRN Phillips Grout, MD       Or  . ondansetron (ZOFRAN) injection 4 mg  4 mg Intravenous Q6H PRN Phillips Grout, MD      . ondansetron (ZOFRAN-ODT) disintegrating tablet 4 mg  4 mg Oral Q6H PRN Phillips Grout, MD      . pantoprazole (PROTONIX) EC tablet 40 mg  40 mg Oral Daily David, Rachal A, MD      . temazepam (RESTORIL) capsule 30 mg  30 mg Oral QHS PRN Phillips Grout, MD      . traZODone (DESYREL) tablet 150 mg  150 mg Oral QHS PRN Phillips Grout, MD        Current Outpatient Medications  Medication Sig Dispense Refill  . atenolol (TENORMIN) 25 MG tablet Take 1 tablet (25 mg total) by mouth daily. For High blood pressure    . gabapentin (NEURONTIN) 300 MG capsule Take 1 capsule (300 mg total) by mouth 3 (three) times daily. For substance withdrawal syndrome/pain management 90 capsule 0  . hydrOXYzine (ATARAX/VISTARIL) 25 MG tablet Take 25 mg (1 tablet) three times daily as needed for anxiety 90 tablet 0  . metoCLOPramide (REGLAN) 5 MG tablet  Take 1 tablet (5 mg) three times daily & 2 tablets (10 mg) at bedtime: Acid reflux 150 tablet 0  . omeprazole (PRILOSEC) 20 MG capsule TAKE 2 CAPSULES BY MOUTH EVERY DAY 60 capsule 2  . ondansetron (ZOFRAN-ODT) 4 MG disintegrating tablet Take 1 tablet by mouth every 6 (six) hours as needed for nausea.   1  . temazepam (RESTORIL) 30 MG capsule Take 1 capsule (30 mg total) by mouth at bedtime as needed for sleep. 14 capsule 0  . traZODone (DESYREL) 150 MG tablet Take 150 mg by mouth at bedtime as needed for sleep.   5  . citalopram (CELEXA) 40 MG tablet Take 1 tablet (40 mg total) by mouth daily. For depression (Patient not taking: Reported on 11/10/2017) 30 tablet 0  . docusate sodium (COLACE) 100 MG capsule Take 1 capsule (100 mg total) by mouth daily as needed for mild constipation. (Patient not taking: Reported on 11/10/2017) 10 capsule 0  . lidocaine (LIDODERM) 5 % Place 1 patch onto the skin daily. Remove & Discard patch within 12 hours or as directed by MD: For pain (Patient not taking: Reported on 11/10/2017) 5 patch 0  . nicotine (NICODERM CQ - DOSED IN MG/24 HOURS) 21 mg/24hr patch Place 1 patch (21 mg total) onto the skin daily. For Nicotine addiction (Patient not taking: Reported on 10/27/2017) 28 patch 0    Allergies as of 11/09/2017 - Review Complete 11/09/2017  Allergen Reaction Noted  . Aspirin Other (See Comments) 06/14/2011  . Celebrex [celecoxib] Nausea Only 09/17/2013  . Nsaids Nausea Only  09/17/2013  . Vicodin [hydrocodone-acetaminophen] Nausea And Vomiting 06/14/2011    Family History  Problem Relation Age of Onset  . Alcohol abuse Mother   . Stroke Father   . Cancer Maternal Grandfather        prostate  . Aneurysm Sister   . Colon cancer Neg Hx     Social History   Socioeconomic History  . Marital status: Widowed    Spouse name: Not on file  . Number of children: 1  . Years of education: GED  . Highest education level: Not on file  Social Needs  . Financial resource strain: Not on file  . Food insecurity - worry: Not on file  . Food insecurity - inability: Not on file  . Transportation needs - medical: Not on file  . 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 Smoking status: Current Every Day Smoker    Packs/day: 0.50    Years: 25.00    Pack years: 12.50    Types: Cigarettes  . Smokeless tobacco: Never Used  . Tobacco comment: Counseling to quit smoking given to patient in exam room   Substance and Sexual Activity  . Alcohol use: No    Comment: Heavy drinker quit  Sept 2012   . Drug use: No  . Sexual activity: No    Birth control/protection: IUD  Other Topics Concern  . Not on file  Social History Narrative   Patient is widowed.   Patient is living alone.   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   Patient drinks 3-4 cups of caffeine daily.   Patient has her GED.   Patient is right-handed.    Review of Systems: All systems reviewed and negative except where noted in HPI.  Physical Exam: Vital signs in last 24  hours: Temp:  [98 F (36.7 C)-99.8 F (37.7 C)] 98 F (36.7 C) (03/01 0656) Pulse Rate:  [68-79] 78 (03/01 0908) Resp:  [18-20] 18 (03/01 0908) BP: (108-149)/(61-78) 112/68 (03/01 0908) SpO2:  [95 %-99 %] 96 % (03/01 0908)   General:   Alert, well-developed, white female in NAD Psych:  Pleasant, cooperative.  Normal mood and affect. Eyes:  Pupils equal, sclera clear, no icterus.   Conjunctiva pink. Ears:  Normal auditory acuity. Nose:  No deformity, discharge,  or lesions. Neck:  Supple; no masses Lungs:  Clear throughout to auscultation.   No wheezes, crackles, or rhonchi.  Heart:  Regular rate and rhythm; no murmurs, no edema Abdomen:  Soft, non-distended, mild RUQ tenderness, BS active, no palp mass    Rectal:  Deferred  Msk:  Symmetrical without gross deformities. . Pulses:  Normal pulses noted. Neurologic:  Alert and  oriented x4;  grossly normal neurologically. Skin:  Intact without significant lesions or rashes..   Intake/Output from previous day: No intake/output data recorded. Intake/Output this shift: No intake/output data recorded.  Lab Results: Recent Labs    11/09/17 2023  WBC 15.7*  HGB 13.8  HCT 43.2  PLT 316   BMET Recent Labs    11/09/17 2023  NA 140  K 4.0  CL 102  CO2 29  GLUCOSE 109*  BUN 14  CREATININE 1.05*  CALCIUM 9.2   LFT Recent Labs    11/09/17 2023  PROT 6.9  ALBUMIN 3.9  AST 118*  ALT 54  ALKPHOS 240*  BILITOT 0.9    Studies/Results: US Abdomen Limited Ruq  Result Date: 11/10/2017 CLINICAL DATA:  57 year old female with right upper quadrant abdominal pain, nausea. EXAM: ULTRASOUND ABDOMEN LIMITED RIGHT UPPER QUADRANT COMPARISON:  Abdominal radiograph dated 10/12/2017 FINDINGS: Gallbladder: Cholecystectomy Common bile duct: Diameter: 12 mm.  There is an 11 mm stone the central CBD. Liver: Increased liver echogenicity may represent fatty infiltration or fibrosis. Clinical correlation is recommended. Portal vein is patent on color Doppler imaging with normal direction of blood flow towards the liver. IMPRESSION: 1. Cholecystectomy. Retained stone in the central CBD. MRCP may provide better evaluation. 2. Probable fatty liver. 3. Patent main portal vein with hepatopetal flow. Electronically Signed   By: Anner Crete M.D.   On: 11/10/2017  05:06     Tye Savoy, NP-C @  11/10/2017, 9:40 AM  Pager number [phone]   Attending physician's note   I have taken a history, examined the patient and reviewed the chart. I agree with the Advanced Practitioner's note, impression and recommendations. 57 year old female with obesity status post cholecystectomy with retained CBD stone presented with worsening right upper quadrant pain, low-grade fever and leukocytosis concerning for cholangitis.  Plan for ERCP later today by Dr. Henrene Pastor for sphincterotomy and stone extraction Continue antibiotics, currently on Unasyn Further recommendations based on ERCP findings  K Denzil Magnuson, MD [phone] Mon-Fri 8a-5p [phone] after 5p, weekends, holidays

## 2017-11-11 DIAGNOSIS — K831 Obstruction of bile duct: Secondary | ICD-10-CM | POA: Diagnosis not present

## 2017-11-11 DIAGNOSIS — K279 Peptic ulcer, site unspecified, unspecified as acute or chronic, without hemorrhage or perforation: Secondary | ICD-10-CM | POA: Diagnosis not present

## 2017-11-11 DIAGNOSIS — Z9049 Acquired absence of other specified parts of digestive tract: Secondary | ICD-10-CM | POA: Diagnosis not present

## 2017-11-11 DIAGNOSIS — K8031 Calculus of bile duct with cholangitis, unspecified, with obstruction: Secondary | ICD-10-CM | POA: Diagnosis not present

## 2017-11-11 DIAGNOSIS — R1011 Right upper quadrant pain: Secondary | ICD-10-CM | POA: Diagnosis not present

## 2017-11-11 DIAGNOSIS — K8050 Calculus of bile duct without cholangitis or cholecystitis without obstruction: Secondary | ICD-10-CM | POA: Diagnosis not present

## 2017-11-11 LAB — COMPREHENSIVE METABOLIC PANEL
ALBUMIN: 3.2 g/dL — AB (ref 3.5–5.0)
ALT: 144 U/L — ABNORMAL HIGH (ref 14–54)
ANION GAP: 7 (ref 5–15)
AST: 117 U/L — AB (ref 15–41)
Alkaline Phosphatase: 245 U/L — ABNORMAL HIGH (ref 38–126)
BUN: 11 mg/dL (ref 6–20)
CO2: 22 mmol/L (ref 22–32)
Calcium: 8.4 mg/dL — ABNORMAL LOW (ref 8.9–10.3)
Chloride: 109 mmol/L (ref 101–111)
Creatinine, Ser: 0.78 mg/dL (ref 0.44–1.00)
GFR calc Af Amer: >60 mL/min (ref >60)
GFR calc non Af Amer: >60 mL/min (ref >60)
GLUCOSE: 140 mg/dL — AB (ref 65–99)
POTASSIUM: 3.9 mmol/L (ref 3.5–5.1)
SODIUM: 138 mmol/L (ref 135–145)
TOTAL PROTEIN: 6.1 g/dL — AB (ref 6.5–8.1)
Total Bilirubin: 2.2 mg/dL — ABNORMAL HIGH (ref 0.3–1.2)

## 2017-11-11 LAB — CBC
HCT: 36.9 % (ref 36.0–46.0)
HEMOGLOBIN: 12.1 g/dL (ref 12.0–15.0)
MCH: 28.5 pg (ref 26.0–34.0)
MCHC: 32.8 g/dL (ref 30.0–36.0)
MCV: 87 fL (ref 78.0–100.0)
Platelets: 238 10*3/uL (ref 150–400)
RBC: 4.24 MIL/uL (ref 3.87–5.11)
RDW: 14.1 % (ref 11.5–15.5)
WBC: 13.9 10*3/uL — ABNORMAL HIGH (ref 4.0–10.5)

## 2017-11-11 MED ORDER — AMOXICILLIN-POT CLAVULANATE 875-125 MG PO TABS: 1.0000 | ORAL_TABLET | Freq: Two times a day (BID) | ORAL | 0 refills | Status: DC

## 2017-11-11 MED ORDER — AMOXICILLIN-POT CLAVULANATE 875-125 MG PO TABS: 1.0000 | ORAL_TABLET | Freq: Two times a day (BID) | ORAL | Status: DC

## 2017-11-11 NOTE — Discharge Summary (Signed)
Physician Discharge Summary  Selena Farrell IRS:[id number] DOB: July 08, 1961 DOA: 11/10/2017  PCP: Jani Gravel, MD  Admit date: 11/10/2017 Discharge date: 11/11/2017  Admitted From: Home Disposition: Home  Recommendations for Outpatient Follow-up:  1. Follow up with PCP in 1-2 weeks 2. Follow up with Gastroenterology as an outpatient. Dr. Vena Rua Nurse will call to schedule follow up for Bloodwork 3. Please obtain CMP/CBC, Mag, Phos in one week 4. Please follow up on the following pending results:  Home Health: No Equipment/Devices: None  Discharge Condition: Stable CODE STATUS: FULL CODE Diet recommendation: Soft Diet  Brief/Interim Summary: Selena Farrell is a 57 y.o. female with medical history significant of status post cholecystectomy with a known common bile duct stone and dilation as scheduled next week to see Dr. Ardis Hughs for ERCP as an outpatient.  Patient's right upper quadrant abdominal pain has been getting worse over the last several days so she was advised to come to the emergency department for ERCP sooner rather than later.  She denies any fevers.  Her abdominal pain got worse after she eats.  She has not had any diarrhea.  She denies any nausea or vomiting.  GI was called and patient underwent ERCP  Patient was being referred for admission for common bile duct stone and dilation. She underwent ERCP with improvement. Diet was advanced and she improved and deemed medically stable to D/C and is to follow up with PCP and Gastroenterology as an outpatient.  Discharge Diagnoses:  Principal Problem:   Common bile duct (CBD) obstruction Active Problems:   PUD (peptic ulcer disease)   Abdominal pain   Bile duct stone   S/P cholecystectomy   Common bile duct calculus  Common bile duct (CBD) obstruction- -No signs or symptoms of ascending cholangitis.   -GI has been consulted who arranged ERCP -Improved after ERCP and bile duct stone removal -GI Recommending continuing Abx for 7  day course so will continue Augmentin for course  Bile duct stones/Choledocholithiasis -ERCP as above    PUD (peptic ulcer disease)  -Continue Protonix  Abdominal pain -Due to the above   Abnormal LFT's -In the Setting of ERCP -GI advising D/Cing having patient repeat LFT's within 1 week -Follow up with Gastroenterology as an outpatient  Hyperbilirubinemia -Worsened after ERCP -T Bili went from 0.9 -> 2.2 -Continue to Montior and repeat CMP as an outpatient  -Follow up with GI within 1 week  Leukocytosis -2/2 to Abdominal Pain Choledocholithiasis -C/w Abx Course -Follow up with Gastroenterology as an outpatient   Discharge Instructions  Allergies as of 11/11/2017      Reactions   Aspirin Other (See Comments)   Pt has bleeding ulcer, not allowed to take aspirin   Celebrex [celecoxib] Nausea Only   Nsaids Nausea Only   Vicodin [hydrocodone-acetaminophen] Nausea And Vomiting   Can take with antiemetics.      Medication List    STOP taking these medications   lidocaine 5 % Commonly known as:  LIDODERM     TAKE these medications   atenolol 25 MG tablet Commonly known as:  TENORMIN Take 1 tablet (25 mg total) by mouth daily. For High blood pressure   citalopram 40 MG tablet Commonly known as:  CELEXA Take 1 tablet (40 mg total) by mouth daily. For depression   docusate sodium 100 MG capsule Commonly known as:  COLACE Take 1 capsule (100 mg total) by mouth daily as needed for mild constipation.   gabapentin 300 MG capsule Commonly known as:  NEURONTIN Take 1 capsule (300 mg total) by mouth 3 (three) times daily. For substance withdrawal syndrome/pain management   hydrOXYzine 25 MG tablet Commonly known as:  ATARAX/VISTARIL Take 25 mg (1 tablet) three times daily as needed for anxiety   metoCLOPramide 5 MG tablet Commonly known as:  REGLAN Take 1 tablet (5 mg) three times daily & 2 tablets (10 mg) at bedtime: Acid reflux   nicotine 21 mg/24hr  patch Commonly known as:  NICODERM CQ - dosed in mg/24 hours Place 1 patch (21 mg total) onto the skin daily. For Nicotine addiction   omeprazole 20 MG capsule Commonly known as:  PRILOSEC TAKE 2 CAPSULES BY MOUTH EVERY DAY   ondansetron 4 MG disintegrating tablet Commonly known as:  ZOFRAN-ODT Take 1 tablet by mouth every 6 (six) hours as needed for nausea.   temazepam 30 MG capsule Commonly known as:  RESTORIL Take 1 capsule (30 mg total) by mouth at bedtime as needed for sleep.   traZODone 150 MG tablet Commonly known as:  DESYREL Take 150 mg by mouth at bedtime as needed for sleep.       Allergies  Allergen Reactions  . Aspirin Other (See Comments)    Pt has bleeding ulcer, not allowed to take aspirin  . Celebrex [Celecoxib] Nausea Only  . Nsaids Nausea Only  . Vicodin [Hydrocodone-Acetaminophen] Nausea And Vomiting    Can take with antiemetics.   Consultations:  [facility]  Procedures/Studies: Dg Ercp With Sphincterotomy  Result Date: 11/10/2017 CLINICAL DATA:  Recent cholecystectomy.  Choledocholithiasis. EXAM: ERCP TECHNIQUE: Multiple spot images obtained with the fluoroscopic device and submitted for interpretation post-procedure. FLUOROSCOPY TIME:  Fluoroscopy Time:  208 seconds Number of Acquired Spot Images: 8 COMPARISON:  Ultrasound 11/10/2017 FINDINGS: Surgical clips in the right upper abdomen compatible with a cholecystectomy. The common bile duct was cannulated and opacified with contrast. Common bile duct is dilated with a large stone or stones. Evidence for stone removal on the follow up images. No definite stones remaining on the final cholangiogram image. IMPRESSION: Choledocholithiasis and stone removal. These images were submitted for radiologic interpretation only. Please see the procedural report for the amount of contrast and the fluoroscopy time utilized. Electronically Signed   By: Markus Daft M.D.   On: 11/10/2017 14:09   US Abdomen  Limited Ruq  Result Date: 11/10/2017 CLINICAL DATA:  57 year old female with right upper quadrant abdominal pain, nausea. EXAM: ULTRASOUND ABDOMEN LIMITED RIGHT UPPER QUADRANT COMPARISON:  Abdominal radiograph dated 10/12/2017 FINDINGS: Gallbladder: Cholecystectomy Common bile duct: Diameter: 12 mm.  There is an 11 mm stone the central CBD. Liver: Increased liver echogenicity may represent fatty infiltration or fibrosis. Clinical correlation is recommended. Portal vein is patent on color Doppler imaging with normal direction of blood flow towards the liver. IMPRESSION: 1. Cholecystectomy. Retained stone in the central CBD. MRCP may provide better evaluation. 2. Probable fatty liver. 3. Patent main portal vein with hepatopetal flow. Electronically Signed   By: Anner Crete M.D.   On: 11/10/2017 05:06     Subjective: Seen and examined at bedside and was feeling better. No CP or SOB. Abdominal Pain improved. Felt a little bloated and discomfort but tolerated food well and ready to go home.  Discharge Exam: Vitals:   11/10/17 2043 11/11/17 0504  BP: 126/78 110/88  Pulse: 64 (!) 58  Resp: 16 16  Temp: 97.7 F (36.5 C) (!) 97.5 F (36.4 C)  SpO2: 95% 96%   Vitals:   11/10/17 1645  11/10/17 1724 11/10/17 2043 11/11/17 0504  BP: 138/78 (!) 141/77 126/78 110/88  Pulse: 61 64 64 (!) 58  Resp: 12 12 16 16   Temp:   97.7 F (36.5 C) (!) 97.5 F (36.4 C)  TempSrc:   Oral Oral  SpO2: 98% 99% 95% 96%  Weight:      Height:       General: Pt is alert, awake, not in acute distress Cardiovascular: RRR, S1/S2 +, no rubs, no gallops Respiratory: CTA bilaterally, no wheezing, no rhonchi Abdominal: Soft, Non Tendr, Slightly distended, bowel sounds + Extremities: no edema, no cyanosis  The results of significant diagnostics from this hospitalization (including imaging, microbiology, ancillary and laboratory) are listed below for reference.    Microbiology: No results found for this or any previous  visit (from the past 240 hour(s)).   Labs: BNP (last 3 results) No results for input(s): BNP in the last 8760 hours. Basic Metabolic Panel: Recent Labs  Lab 11/09/17 2023 11/11/17 0439  NA 140 138  K 4.0 3.9  CL 102 109  CO2 29 22  GLUCOSE 109* 140*  BUN 14 11  CREATININE 1.05* 0.78  CALCIUM 9.2 8.4*   Liver Function Tests: Recent Labs  Lab 11/09/17 2023 11/11/17 0439  AST 118* 117*  ALT 54 144*  ALKPHOS 240* 245*  BILITOT 0.9 2.2*  PROT 6.9 6.1*  ALBUMIN 3.9 3.2*   Recent Labs  Lab 11/09/17 2023  LIPASE 32   No results for input(s): AMMONIA in the last 168 hours. CBC: Recent Labs  Lab 11/09/17 2023 11/11/17 0439  WBC 15.7* 13.9*  HGB 13.8 12.1  HCT 43.2 36.9  MCV 87.8 87.0  PLT 316 238   Cardiac Enzymes: No results for input(s): CKTOTAL, CKMB, CKMBINDEX, TROPONINI in the last 168 hours. BNP: Invalid input(s): POCBNP CBG: No results for input(s): GLUCAP in the last 168 hours. D-Dimer No results for input(s): DDIMER in the last 72 hours. Hgb A1c No results for input(s): HGBA1C in the last 72 hours. Lipid Profile No results for input(s): CHOL, HDL, LDLCALC, TRIG, CHOLHDL, LDLDIRECT in the last 72 hours. Thyroid function studies No results for input(s): TSH, T4TOTAL, T3FREE, THYROIDAB in the last 72 hours.  Invalid input(s): FREET3 Anemia work up No results for input(s): VITAMINB12, FOLATE, FERRITIN, TIBC, IRON, RETICCTPCT in the last 72 hours. Urinalysis    Component Value Date/Time   COLORURINE AMBER (A) 11/10/2017 0637   APPEARANCEUR CLEAR 11/10/2017 0637   LABSPEC 1.016 11/10/2017 0637   PHURINE 8.0 11/10/2017 0637   GLUCOSEU NEGATIVE 11/10/2017 0637   GLUCOSEU NEGATIVE 07/04/2011 1516   HGBUR NEGATIVE 11/10/2017 0637   BILIRUBINUR NEGATIVE 11/10/2017 0637   KETONESUR NEGATIVE 11/10/2017 0637   PROTEINUR NEGATIVE 11/10/2017 0637   UROBILINOGEN 0.2 12/27/2013 1433   NITRITE NEGATIVE 11/10/2017 0637   LEUKOCYTESUR TRACE (A) 11/10/2017  0637   Sepsis Labs Invalid input(s): PROCALCITONIN,  WBC,  LACTICIDVEN Microbiology No results found for this or any previous visit (from the past 240 hour(s)).  Time coordinating discharge: 35 minutes  SIGNED:  Kerney Elbe, DO [facility] 11/11/2017, 12:25 PM Pager [phone]  If 7PM-7AM, please contact night-coverage [email or web address] Password [email or web address]

## 2017-11-11 NOTE — Progress Notes (Addendum)
[facility] Progress Note   Chief Complaint:   Abdominal pain and bile duct stones   SUBJECTIVE:    feels much better today. Has some gas / bloating but no significant pain. Interested in advancing diet   ASSESSMENT AND PLAN:   57 yo female with choledocholithiasis, s/p ERCP with stone extraction and extension of prior sphincterotomy yesterday.  White count has improved from 15.7 to 13.9, she is afebrile.  -Alk phos and bili up but suspect procedure related. Good biliary drainage was seen following removal of obstructing CBD stone. -will advance to regular diet.     OBJECTIVE:     Vital signs in last 24 hours: Temp:  [97.5 F (36.4 C)-98.8 F (37.1 C)] 97.5 F (36.4 C) (03/02 0504) Pulse Rate:  [58-81] 58 (03/02 0504) Resp:  [12-20] 16 (03/02 0504) BP: (110-160)/(66-101) 110/88 (03/02 0504) SpO2:  [92 %-99 %] 96 % (03/02 0504) Weight:  [185 lb (83.9 kg)-185 lb 6.5 oz (84.1 kg)] 185 lb (83.9 kg) (03/01 1215) Last BM Date: 11/09/17 General:   Alert, well-developed, white female in NAD EENT:  Normal hearing, non icteric sclera, conjunctive pink.  Heart:  Regular rate and rhythm. No lower extremity edema Pulm: Normal respiratory effort, lungs CTA bilaterally without wheezes or crackles. Abdomen:  Soft, nondistended, nontender.  Normal bowel sounds, no masses felt. No hepatomegaly.    Neurologic:  Alert and  oriented x4;  grossly normal neurologically. Psych:  Pleasant, cooperative.  Normal mood and affect.   Intake/Output from previous day: 03/01 0701 - 03/02 0700 In: 2091.3 [P.O.:360; I.V.:1331.3; IV Piggyback:400] Out: 300 [Urine:300] Intake/Output this shift: Total I/O In: 240 [P.O.:240] Out: -   Lab Results: Recent Labs    11/09/17 2023 11/11/17 0439  WBC 15.7* 13.9*  HGB 13.8 12.1  HCT 43.2 36.9  PLT 316 238   BMET Recent Labs    11/09/17 2023 11/11/17 0439  NA 140 138  K 4.0 3.9  CL 102 109  CO2 29 22  GLUCOSE 109* 140*  BUN  14 11  CREATININE 1.05* 0.78  CALCIUM 9.2 8.4*   LFT Recent Labs    11/11/17 0439  PROT 6.1*  ALBUMIN 3.2*  AST 117*  ALT 144*  ALKPHOS 245*  BILITOT 2.2*      Dg Ercp With Sphincterotomy  Result Date: 11/10/2017 CLINICAL DATA:  Recent cholecystectomy.  Choledocholithiasis. EXAM: ERCP TECHNIQUE: Multiple spot images obtained with the fluoroscopic device and submitted for interpretation post-procedure. FLUOROSCOPY TIME:  Fluoroscopy Time:  208 seconds Number of Acquired Spot Images: 8 COMPARISON:  Ultrasound 11/10/2017 FINDINGS: Surgical clips in the right upper abdomen compatible with a cholecystectomy. The common bile duct was cannulated and opacified with contrast. Common bile duct is dilated with a large stone or stones. Evidence for stone removal on the follow up images. No definite stones remaining on the final cholangiogram image. IMPRESSION: Choledocholithiasis and stone removal. These images were submitted for radiologic interpretation only. Please see the procedural report for the amount of contrast and the fluoroscopy time utilized. Electronically Signed   By: Markus Daft M.D.   On: 11/10/2017 14:09   US Abdomen Limited Ruq  Result Date: 11/10/2017 CLINICAL DATA:  57 year old female with right upper quadrant abdominal pain, nausea. EXAM: ULTRASOUND ABDOMEN LIMITED RIGHT UPPER QUADRANT COMPARISON:  Abdominal radiograph dated 10/12/2017 FINDINGS: Gallbladder: Cholecystectomy Common bile duct: Diameter: 12 mm.  There is an 11 mm stone the central CBD. Liver: Increased liver echogenicity may represent fatty infiltration or  fibrosis. Clinical correlation is recommended. Portal vein is patent on color Doppler imaging with normal direction of blood flow towards the liver. IMPRESSION: 1. Cholecystectomy. Retained stone in the central CBD. MRCP may provide better evaluation. 2. Probable fatty liver. 3. Patent main portal vein with hepatopetal flow. Electronically Signed   By: Anner Crete  M.D.   On: 11/10/2017 05:06     Discharge Planning Diet: regular Anticoagulation and antiplatelets:  N/A Discharge Medications: No changes from GI [facility] Follow up:  As needed  Principal Problem:   Common bile duct (CBD) obstruction Active Problems:   PUD (peptic ulcer disease)   Abdominal pain   Bile duct stone   S/P cholecystectomy   Common bile duct calculus     LOS: 0 days   Selena Farrell ,NP 11/11/2017, 10:02 AM  Pager number [phone]    Attending physician's note   I have taken an interval history, reviewed the chart and examined the patient. I agree with the Advanced Practitioner's note, impression and recommendations.  S/p ERCP yesterday with sphincterotomy and stone extraction. Feels much better. Advance diet as tolerated Continue antibiotics for 7 day course Bili and Alk phos remain elevated, will recheck LFT in 1 week to make sure trending down to normal range K Denzil Magnuson, MD [phone] Mon-Fri 8a-5p [phone] after 5p, weekends, holidays

## 2017-11-13 ENCOUNTER — Telehealth: Payer: Self-pay | Admitting: Nurse Practitioner

## 2017-11-13 ENCOUNTER — Encounter (HOSPITAL_COMMUNITY): Payer: Self-pay | Admitting: Internal Medicine

## 2017-11-13 DIAGNOSIS — K831 Obstruction of bile duct: Secondary | ICD-10-CM

## 2017-11-13 NOTE — Anesthesia Postprocedure Evaluation (Signed)
Anesthesia Post Note  Patient: Selena Farrell  Procedure(s) Performed: ENDOSCOPIC RETROGRADE CHOLANGIOPANCREATOGRAPHY (ERCP) (N/A )     Patient location during evaluation: Endoscopy Anesthesia Type: General Level of consciousness: awake and alert Pain management: pain level controlled Vital Signs Assessment: post-procedure vital signs reviewed and stable Respiratory status: spontaneous breathing, nonlabored ventilation, respiratory function stable and patient connected to nasal cannula oxygen Cardiovascular status: blood pressure returned to baseline and stable Postop Assessment: no apparent nausea or vomiting Anesthetic complications: no    Last Vitals:  Vitals:   11/10/17 2043 11/11/17 0504  BP: 126/78 110/88  Pulse: 64 (!) 58  Resp: 16 16  Temp: 36.5 C (!) 36.4 C  SpO2: 95% 96%    Last Pain:  Vitals:   11/11/17 0800  TempSrc:   PainSc: 0-No pain                 Mertha Clyatt COKER

## 2017-11-13 NOTE — Telephone Encounter (Signed)
Patient states she had an ERCP on Friday 3.1.19 but last saw Moffat in office 2.15.19. Pt states she is feeling fatigue and light headedness and wants to know if it could be from procedure.

## 2017-11-13 NOTE — Telephone Encounter (Signed)
Patient calls post ERCP. She asks if it is okay for her to be tired and sometimes she feels a "little lightheaded." She denies fever, abdominal pain or nausea. No tarry stools. Per Tye Savoy, NP the patient is advised to continue encouraging fluids and eat light meals. Rest as needed. Repeat LFT on 3/ or 3/8. Orders entered into Epic.

## 2017-11-14 ENCOUNTER — Other Ambulatory Visit: Payer: Self-pay

## 2017-11-14 ENCOUNTER — Telehealth: Payer: Self-pay | Admitting: Gastroenterology

## 2017-11-14 DIAGNOSIS — K831 Obstruction of bile duct: Secondary | ICD-10-CM

## 2017-11-14 NOTE — Telephone Encounter (Signed)
Thank you :)

## 2017-11-14 NOTE — Telephone Encounter (Signed)
The appt has been cancelled.   

## 2017-11-14 NOTE — Telephone Encounter (Signed)
Dr Ardis Hughs the pt had ERCP on 11/10/17 as an emergency case.  I will cancel her appt for 11/16/17.

## 2017-11-14 NOTE — Telephone Encounter (Signed)
Selena Farrell, we discussed this patient yesterday. She and I did talk yesterday. Today she calls with new concerns. She states the pain in her upper abdomen has returned. She has waves of pain that go from mild to knife like under the right breast towards the center. Radiates into her back. She states she is "swollen" in her stomach. Using Gas-X without relief. Urine is light in color. She is having loose bowel movements. Afebrile. Please advise.

## 2017-11-14 NOTE — Telephone Encounter (Signed)
Patient calling back about this stating that she did not speak with Beth yesterday and would like to speak with her today if possible.

## 2017-11-14 NOTE — Telephone Encounter (Signed)
Patient will come for LFT and CBC tomorrow.

## 2017-11-15 ENCOUNTER — Other Ambulatory Visit (INDEPENDENT_AMBULATORY_CARE_PROVIDER_SITE_OTHER): Payer: BC Managed Care – PPO

## 2017-11-15 DIAGNOSIS — K831 Obstruction of bile duct: Secondary | ICD-10-CM | POA: Diagnosis not present

## 2017-11-15 LAB — CBC WITH DIFFERENTIAL/PLATELET
Basophils Absolute: 0.1 10*3/uL (ref 0.0–0.1)
Basophils Relative: 0.9 % (ref 0.0–3.0)
EOS PCT: 1.1 % (ref 0.0–5.0)
Eosinophils Absolute: 0.2 10*3/uL (ref 0.0–0.7)
HCT: 39.2 % (ref 36.0–46.0)
Hemoglobin: 13.1 g/dL (ref 12.0–15.0)
LYMPHS ABS: 3.9 10*3/uL (ref 0.7–4.0)
Lymphocytes Relative: 26.1 % (ref 12.0–46.0)
MCHC: 33.5 g/dL (ref 30.0–36.0)
MCV: 84.6 fl (ref 78.0–100.0)
MONO ABS: 1 10*3/uL (ref 0.1–1.0)
MONOS PCT: 6.9 % (ref 3.0–12.0)
NEUTROS ABS: 9.6 10*3/uL — AB (ref 1.4–7.7)
NEUTROS PCT: 65 % (ref 43.0–77.0)
PLATELETS: 280 10*3/uL (ref 150.0–400.0)
RBC: 4.63 Mil/uL (ref 3.87–5.11)
RDW: 14.2 % (ref 11.5–15.5)
WBC: 14.8 10*3/uL — ABNORMAL HIGH (ref 4.0–10.5)

## 2017-11-15 LAB — HEPATIC FUNCTION PANEL
ALK PHOS: 225 U/L — AB (ref 39–117)
ALT: 39 U/L — ABNORMAL HIGH (ref 0–35)
AST: 14 U/L (ref 0–37)
Albumin: 3.7 g/dL (ref 3.5–5.2)
BILIRUBIN DIRECT: 0.2 mg/dL (ref 0.0–0.3)
TOTAL PROTEIN: 6.6 g/dL (ref 6.0–8.3)
Total Bilirubin: 0.6 mg/dL (ref 0.2–1.2)

## 2017-11-16 ENCOUNTER — Ambulatory Visit (HOSPITAL_COMMUNITY)
Admission: RE | Admit: 2017-11-16 | Payer: BC Managed Care – PPO | Source: Ambulatory Visit | Admitting: Gastroenterology

## 2017-11-16 SURGERY — ENDOSCOPIC RETROGRADE CHOLANGIOPANCREATOGRAPHY (ERCP) WITH PROPOFOL
Anesthesia: General

## 2018-01-26 ENCOUNTER — Emergency Department (HOSPITAL_COMMUNITY)
Admission: EM | Admit: 2018-01-26 | Discharge: 2018-01-26 | Disposition: A | Discharge status: Home / Self Care | Payer: BC Managed Care – PPO | Attending: Emergency Medicine | Admitting: Emergency Medicine

## 2018-01-26 ENCOUNTER — Encounter (HOSPITAL_COMMUNITY): Payer: Self-pay | Admitting: Emergency Medicine

## 2018-01-26 ENCOUNTER — Emergency Department (HOSPITAL_COMMUNITY): Payer: BC Managed Care – PPO

## 2018-01-26 DIAGNOSIS — I10 Essential (primary) hypertension: Secondary | ICD-10-CM | POA: Insufficient documentation

## 2018-01-26 DIAGNOSIS — R112 Nausea with vomiting, unspecified: Secondary | ICD-10-CM | POA: Insufficient documentation

## 2018-01-26 DIAGNOSIS — F1020 Alcohol dependence, uncomplicated: Secondary | ICD-10-CM | POA: Diagnosis not present

## 2018-01-26 DIAGNOSIS — R197 Diarrhea, unspecified: Secondary | ICD-10-CM

## 2018-01-26 DIAGNOSIS — F329 Major depressive disorder, single episode, unspecified: Secondary | ICD-10-CM | POA: Insufficient documentation

## 2018-01-26 DIAGNOSIS — Z9049 Acquired absence of other specified parts of digestive tract: Secondary | ICD-10-CM | POA: Diagnosis not present

## 2018-01-26 DIAGNOSIS — Z79899 Other long term (current) drug therapy: Secondary | ICD-10-CM | POA: Diagnosis not present

## 2018-01-26 DIAGNOSIS — R1011 Right upper quadrant pain: Secondary | ICD-10-CM

## 2018-01-26 DIAGNOSIS — F17210 Nicotine dependence, cigarettes, uncomplicated: Secondary | ICD-10-CM | POA: Insufficient documentation

## 2018-01-26 DIAGNOSIS — N3001 Acute cystitis with hematuria: Secondary | ICD-10-CM | POA: Diagnosis not present

## 2018-01-26 LAB — LIPASE, BLOOD: Lipase: 23 U/L (ref 11–51)

## 2018-01-26 LAB — URINALYSIS, ROUTINE W REFLEX MICROSCOPIC
Bilirubin Urine: NEGATIVE
Glucose, UA: NEGATIVE mg/dL
Ketones, ur: NEGATIVE mg/dL
Nitrite: NEGATIVE
Protein, ur: NEGATIVE mg/dL
Specific Gravity, Urine: 1.012 (ref 1.005–1.030)
WBC, UA: >50 WBC/hpf — ABNORMAL HIGH (ref 0–5)
pH: 7 (ref 5.0–8.0)

## 2018-01-26 LAB — COMPREHENSIVE METABOLIC PANEL
ALT: 25 U/L (ref 14–54)
AST: 31 U/L (ref 15–41)
Albumin: 3.8 g/dL (ref 3.5–5.0)
Alkaline Phosphatase: 135 U/L — ABNORMAL HIGH (ref 38–126)
Anion gap: 10 (ref 5–15)
BUN: 7 mg/dL (ref 6–20)
CO2: 26 mmol/L (ref 22–32)
Calcium: 9.1 mg/dL (ref 8.9–10.3)
Chloride: 103 mmol/L (ref 101–111)
Creatinine, Ser: 0.86 mg/dL (ref 0.44–1.00)
GFR calc Af Amer: >60 mL/min (ref >60)
GFR calc non Af Amer: >60 mL/min (ref >60)
Glucose, Bld: 99 mg/dL (ref 65–99)
Potassium: 4.2 mmol/L (ref 3.5–5.1)
Sodium: 139 mmol/L (ref 135–145)
Total Bilirubin: 0.7 mg/dL (ref 0.3–1.2)
Total Protein: 7 g/dL (ref 6.5–8.1)

## 2018-01-26 LAB — CBC WITH DIFFERENTIAL/PLATELET
Basophils Absolute: 0 10*3/uL (ref 0.0–0.1)
Basophils Relative: 0 %
Eosinophils Absolute: 0.2 10*3/uL (ref 0.0–0.7)
Eosinophils Relative: 2 %
HCT: 44.6 % (ref 36.0–46.0)
Hemoglobin: 14.9 g/dL (ref 12.0–15.0)
Lymphocytes Relative: 31 %
Lymphs Abs: 3.4 10*3/uL (ref 0.7–4.0)
MCH: 28.5 pg (ref 26.0–34.0)
MCHC: 33.4 g/dL (ref 30.0–36.0)
MCV: 85.4 fL (ref 78.0–100.0)
Monocytes Absolute: 0.8 10*3/uL (ref 0.1–1.0)
Monocytes Relative: 7 %
Neutro Abs: 6.6 10*3/uL (ref 1.7–7.7)
Neutrophils Relative %: 60 %
Platelets: 292 10*3/uL (ref 150–400)
RBC: 5.22 MIL/uL — ABNORMAL HIGH (ref 3.87–5.11)
RDW: 13.5 % (ref 11.5–15.5)
WBC: 10.9 10*3/uL — ABNORMAL HIGH (ref 4.0–10.5)

## 2018-01-26 MED ORDER — AMOXICILLIN-POT CLAVULANATE 875-125 MG PO TABS: 1.0000 | ORAL_TABLET | Freq: Two times a day (BID) | ORAL | 0 refills | Status: AC

## 2018-01-26 MED ORDER — ONDANSETRON 4 MG PO TBDP: 4.0000 mg | ORAL_TABLET | Freq: Three times a day (TID) | ORAL | 0 refills | Status: DC | PRN

## 2018-01-26 MED ORDER — HYDROMORPHONE HCL 1 MG/ML IJ SOLN
1.0000 mg | Freq: Once | INTRAMUSCULAR | Status: AC
  Administered 2018-01-26: 1 mg via INTRAVENOUS
  Filled 2018-01-26: qty 1

## 2018-01-26 MED ORDER — SODIUM CHLORIDE 0.9 % IV BOLUS
1000.0000 mL | Freq: Once | INTRAVENOUS | Status: AC
  Administered 2018-01-26: 1000 mL via INTRAVENOUS

## 2018-01-26 MED ORDER — IOPAMIDOL (ISOVUE-300) INJECTION 61%
INTRAVENOUS | Status: AC
  Filled 2018-01-26: qty 100

## 2018-01-26 MED ORDER — ONDANSETRON HCL 4 MG/2ML IJ SOLN
4.0000 mg | Freq: Once | INTRAMUSCULAR | Status: AC
  Administered 2018-01-26: 4 mg via INTRAVENOUS
  Filled 2018-01-26: qty 2

## 2018-01-26 MED ORDER — AMOXICILLIN-POT CLAVULANATE 875-125 MG PO TABS
1.0000 | ORAL_TABLET | Freq: Once | ORAL | Status: AC
  Administered 2018-01-26: 1 via ORAL
  Filled 2018-01-26: qty 1

## 2018-01-26 MED ORDER — IOHEXOL 300 MG/ML  SOLN
30.0000 mL | Freq: Once | INTRAMUSCULAR | Status: AC | PRN
  Administered 2018-01-26: 30 mL via ORAL

## 2018-01-26 MED ORDER — AMOXICILLIN-POT CLAVULANATE 875-125 MG PO TABS: 1.0000 | ORAL_TABLET | Freq: Two times a day (BID) | ORAL | 0 refills | Status: DC

## 2018-01-26 MED ORDER — IOPAMIDOL (ISOVUE-300) INJECTION 61%
100.0000 mL | Freq: Once | INTRAVENOUS | Status: AC | PRN
  Administered 2018-01-26: 100 mL via INTRAVENOUS

## 2018-01-26 NOTE — ED Provider Notes (Signed)
[facility] DEPT Provider Note   CSN: [id number] Arrival date & time: 01/26/18  1147     History   Chief Complaint Chief Complaint  Patient presents with  . Emesis  . Abdominal Pain    HPI Selena Farrell is a 57 y.o. female with history of alcohol dependence, nicotine dependence, hypertension, H. pylori infection, and choledocholithiasis presents for evaluation of acute onset, per aggressively worsening nausea, vomiting, diarrhea, and abdominal pain for 3 days.  She states that her symptoms began with diarrhea 3 days ago and has had approximately 10 episodes daily of watery nonbloody diarrhea.  She notes 6 episodes of nonbloody nonbilious emesis over the past 3 days but emesis stopped yesterday.  She notes intermittent right upper quadrant abdominal pain which will radiate to the right lower quadrant and epigastric region occasionally.  No aggravating or alleviating factors noted.  She had a cholecystectomy several years ago and underwent emergent ERCP on 11/10/2017 for common duct dilatation and choledocholithiasis.  She has not followed up with her gastroneurologist since then.  She thinks she may have had some hematuria earlier today.  She denies dysuria, urgency, frequency,and fevers but endorses chills.  she was seen and evaluated by her primary care physician's PA earlier today who did lab work and noted a leukocytosis and recommended presenting to the ED for further evaluation and management.  She has not tried anything for her symptoms other than trying to stay hydrated.  She did eat [employer] prior to symptom onset 3 days ago.   The history is provided by the patient.    Past Medical History:  Diagnosis Date  . Alcohol dependence ([place])    Quit Sept. 2012   . Bronchitis   . Choledocholithiasis 06/23/2011   ERCP, shincterotomy and baloon stone extraction by Dr Fuller Plan.   . Cough 07/29/2013  . Delayed gastric emptying   . Depression   . External  hemorrhoid   . Gastric ulcer 05/2011   EGD-Dr. Hilarie Fredrickson   . Gastritis 05/2011   EGD-Dr. Hilarie Fredrickson   . GERD (gastroesophageal reflux disease)   . Helicobacter pylori (H. pylori) infection    Hx of   . Hyperplastic colon polyp   . Hypertension   . Hyponatremia   . Internal hemorrhoids   . Nicotine dependence   . Tenosynovitis    right [facility] joint    Patient Active Problem List   Diagnosis Date Noted  . Common bile duct (CBD) obstruction 11/10/2017  . Bile duct stone 11/10/2017  . S/P cholecystectomy 11/10/2017  . Common bile duct calculus   . Opioid type dependence ([place]) 12/29/2013    Class: Acute  . MDD (major depressive disorder) 12/27/2013  . Cough 07/29/2013  . Unspecified constipation 12/25/2012  . Nausea alone 12/25/2012  . Diffuse abdominal pain 12/25/2012  . Hemorrhoids 12/25/2012  . Leukocytosis, unspecified 11/28/2012  . Abdominal pain 11/27/2012  . PUD (peptic ulcer disease) 06/15/2011  . Tobacco abuse 06/14/2011  . Helicobacter positive gastritis 06/14/2011    Past Surgical History:  Procedure Laterality Date  . APPENDECTOMY    . cervical neck fusion  03/2004   C5-C6 anterior discectomy and fusion with allograft  . CESAREAN SECTION     x1  . CHOLECYSTECTOMY N/A 11/29/2012   Procedure: LAPAROSCOPIC CHOLECYSTECTOMY WITH INTRAOPERATIVE CHOLANGIOGRAM;  Surgeon: Earnstine Regal, MD;  Location: [facility] ORS;  Service: General;  Laterality: N/A;  . ERCP N/A 11/10/2017   Procedure: ENDOSCOPIC RETROGRADE CHOLANGIOPANCREATOGRAPHY (ERCP);  Surgeon: Scarlette Shorts  N, MD;  Location: [facility] ENDOSCOPY;  Service: Endoscopy;  Laterality: N/A;  . ERCP W/ SPHICTEROTOMY  06/23/2011   ERCP, shincterotomy and baloon stone extraction by Dr Fuller Plan. 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   . ESOPHAGOGASTRODUODENOSCOPY N/A 11/28/2012   Procedure: ESOPHAGOGASTRODUODENOSCOPY (EGD);  Surgeon: Jerene Bears, MD;  Location: [facility];  Service: Gastroenterology;  Laterality: N/A;     OB History     None      Home Medications    Prior to Admission medications   Medication Sig Start Date End Date Taking? Authorizing Provider  atenolol (TENORMIN) 25 MG tablet Take 1 tablet (25 mg total) by mouth daily. For High blood pressure 01/01/14  Yes Nwoko, Agnes I, NP  gabapentin (NEURONTIN) 300 MG capsule Take 1 capsule (300 mg total) by mouth 3 (three) times daily. For substance withdrawal syndrome/pain management Patient taking differently: Take 300 mg by mouth 2 (two) times daily. For substance withdrawal syndrome/pain management 01/01/14  Yes Lindell Spar I, NP  Melatonin 5 MG TABS Take 1 tablet by mouth at bedtime.   Yes [provider]  omeprazole (PRILOSEC) 20 MG capsule TAKE 2 CAPSULES BY MOUTH EVERY DAY   Yes Pyrtle, Lajuan Lines, MD  temazepam (RESTORIL) 15 MG capsule Take 15 mg by mouth at bedtime as needed for sleep.  01/10/18  Yes [provider]  traZODone (DESYREL) 150 MG tablet Take 150 mg by mouth at bedtime.  11/06/17  Yes [provider]  amoxicillin-clavulanate (AUGMENTIN) 875-125 MG tablet Take 1 tablet by mouth every 12 (twelve) hours for 10 days. 01/26/18 02/05/18  Rodell Perna A, PA-C  citalopram (CELEXA) 40 MG tablet Take 1 tablet (40 mg total) by mouth daily. For depression Patient not taking: Reported on 11/10/2017 01/01/14   Lindell Spar I, NP  docusate sodium (COLACE) 100 MG capsule Take 1 capsule (100 mg total) by mouth daily as needed for mild constipation. Patient not taking: Reported on 11/10/2017 01/01/14   Lindell Spar I, NP  hydrOXYzine (ATARAX/VISTARIL) 25 MG tablet Take 25 mg (1 tablet) three times daily as needed for anxiety 01/01/14   Lindell Spar I, NP  metoCLOPramide (REGLAN) 5 MG tablet Take 1 tablet (5 mg) three times daily & 2 tablets (10 mg) at bedtime: Acid reflux Patient not taking: Reported on 01/26/2018 01/01/14   Lindell Spar I, NP  nicotine (NICODERM CQ - DOSED IN MG/24 HOURS) 21 mg/24hr patch Place 1 patch (21 mg total) onto the skin daily.  For Nicotine addiction Patient not taking: Reported on 10/27/2017 01/01/14   Lindell Spar I, NP  ondansetron (ZOFRAN ODT) 4 MG disintegrating tablet Take 1 tablet (4 mg total) by mouth every 8 (eight) hours as needed for nausea or vomiting. 01/26/18   Rodell Perna A, PA-C  ondansetron (ZOFRAN-ODT) 4 MG disintegrating tablet Take 1 tablet by mouth every 6 (six) hours as needed for nausea.  11/03/17   [provider]  temazepam (RESTORIL) 30 MG capsule Take 1 capsule (30 mg total) by mouth at bedtime as needed for sleep. Patient not taking: Reported on 01/26/2018 01/01/14   Encarnacion Slates, NP    Family History Family History  Problem Relation Age of Onset  . Alcohol abuse Mother   . Stroke Father   . Cancer Maternal Grandfather        prostate  . Aneurysm Sister   . Colon cancer Neg Hx     Social History Social History   Tobacco Use  . Smoking  status: Current Every Day Smoker    Packs/day: 0.50    Years: 25.00    Pack years: 12.50    Types: Cigarettes  . Smokeless tobacco: Never Used  . Tobacco comment: Counseling to quit smoking given to patient in exam room   Substance Use Topics  . Alcohol use: No    Comment: Heavy drinker quit  Sept 2012   . Drug use: No     Allergies   Aspirin; Celebrex [celecoxib]; Nsaids; and Vicodin [hydrocodone-acetaminophen]   Review of Systems Review of Systems  Constitutional: Positive for chills. Negative for fever.  Gastrointestinal: Positive for abdominal pain, diarrhea, nausea and vomiting. Negative for blood in stool.  Genitourinary: Positive for hematuria. Negative for dysuria, frequency, urgency, vaginal bleeding, vaginal discharge and vaginal pain.  All other systems reviewed and are negative.    Physical Exam Updated Vital Signs BP (!) 146/73 (BP Location: Left Arm)   Pulse 66   Temp 98.5 F (36.9 C) (Oral)   Resp 18   Ht 5\' 4"  (1.626 m)   Wt 83.5 kg (184 lb)   SpO2 96%   BMI 31.58 kg/m   Physical Exam    Constitutional: She appears well-developed and well-nourished. No distress.  HENT:  Head: Normocephalic and atraumatic.  Eyes: Conjunctivae are normal. Right eye exhibits no discharge. Left eye exhibits no discharge.  Neck: No JVD present. No tracheal deviation present.  Cardiovascular: Normal rate, regular rhythm, normal heart sounds and intact distal pulses.  Pulmonary/Chest: Effort normal and breath sounds normal. No stridor. No respiratory distress.  Abdominal: Normal appearance. She exhibits distension. Bowel sounds are decreased. There is tenderness in the right upper quadrant, right lower quadrant, epigastric area and suprapubic area. There is no rigidity, no rebound, no guarding, no CVA tenderness, no tenderness at McBurney's point and negative Murphy's sign.  Well-healed surgical incisions noted  Genitourinary:  Genitourinary Comments: deferred  Musculoskeletal: She exhibits no edema.  Neurological: She is alert.  Skin: Skin is warm and dry. No erythema.  Psychiatric: She has a normal mood and affect. Her behavior is normal.  Nursing note and vitals reviewed.    ED Treatments / Results  Labs (all labs ordered are listed, but only abnormal results are displayed) Labs Reviewed  CBC WITH DIFFERENTIAL/PLATELET - Abnormal; Notable for the following components:      Result Value   WBC 10.9 (*)    RBC 5.22 (*)    All other components within normal limits  COMPREHENSIVE METABOLIC PANEL - Abnormal; Notable for the following components:   Alkaline Phosphatase 135 (*)    All other components within normal limits  URINALYSIS, ROUTINE W REFLEX MICROSCOPIC - Abnormal; Notable for the following components:   APPearance HAZY (*)    Hgb urine dipstick SMALL (*)    Leukocytes, UA LARGE (*)    WBC, UA >50 (*)    Bacteria, UA RARE (*)    All other components within normal limits  URINE CULTURE  LIPASE, BLOOD    EKG None  Radiology Ct Abdomen Pelvis W Contrast  Result Date:  01/26/2018 CLINICAL DATA:  Abdominal pain. Rule out gastroenteritis or colitis. Leukocytosis EXAM: CT ABDOMEN AND PELVIS WITH CONTRAST TECHNIQUE: Multidetector CT imaging of the abdomen and pelvis was performed using the standard protocol following bolus administration of intravenous contrast. CONTRAST:  69mL OMNIPAQUE IOHEXOL 300 MG/ML SOLN, 174mL ISOVUE-300 IOPAMIDOL (ISOVUE-300) INJECTION 61% COMPARISON:  Ultrasound abdomen 11/10/2017 FINDINGS: Lower chest: Mild scarring in the lingula and right middle lobe.  No effusion Hepatobiliary: Cholecystectomy. Gas in the bile ducts due to sphincterotomy. Bile ducts nondilated. Common bile duct 9.2 mm Pancreas: Negative Spleen: Negative Adrenals/Urinary Tract: Normal kidneys. No renal mass or obstruction or stone. Normal bladder. Stomach/Bowel: Negative for bowel obstruction. Normal stomach. No bowel mass. Question mild colonic edema diffusely. No small bowel edema. Appendectomy. Vascular/Lymphatic: Atherosclerotic aorta without aneurysm. Reproductive: IUD in good position. Uterus normal in size. No adnexal mass. Other: Negative for free fluid.  No hernia. Musculoskeletal: Negative IMPRESSION: Mild colonic edema.  Possible colitis.  No bowel obstruction. Pneumobilia compatible with prior cholecystectomy and sphincterotomy. No biliary dilatation. Electronically Signed   By: Franchot Gallo M.D.   On: 01/26/2018 19:24    Procedures Procedures (including critical care time)  Medications Ordered in ED Medications  iopamidol (ISOVUE-300) 61 % injection (has no administration in time range)  amoxicillin-clavulanate (AUGMENTIN) 875-125 MG per tablet 1 tablet (has no administration in time range)  sodium chloride 0.9 % bolus 1,000 mL (0 mLs Intravenous Stopped 01/26/18 1841)  ondansetron (ZOFRAN) injection 4 mg (4 mg Intravenous Given 01/26/18 1722)  iohexol (OMNIPAQUE) 300 MG/ML solution 30 mL (30 mLs Oral Contrast Given 01/26/18 1701)  HYDROmorphone (DILAUDID) injection  1 mg (1 mg Intravenous Given 01/26/18 1845)  iopamidol (ISOVUE-300) 61 % injection 100 mL (100 mLs Intravenous Contrast Given 01/26/18 1902)     Initial Impression / Assessment and Plan / ED Course  I have reviewed the triage vital signs and the nursing notes.  Pertinent labs & imaging results that were available during my care of the patient were reviewed by me and considered in my medical decision making (see chart for details).     Patient presents with 3-day history of nausea, vomiting, and diarrhea with intermittent right-sided abdominal pain.  She is afebrile, vital signs are stable.  She is nontoxic in appearance.  She was sent over by her primary care physician for further evaluation and work-up.  She has a leukocytosis but upon chart review this is chronic and actually improved as compared to her baseline today.  Remainder of lab work reviewed by me also shows an elevated alkaline phosphatase which is improved from lab work 2 months ago.  Creatinine, lipase within normal limits.  Her urine is somewhat concerning for UTI, we will culture.  CT of the abdomen and pelvis shows changes consistent with possible colitis.  She did eat fast food prior to symptom onset 3 days ago.  On reevaluation the patient is resting comfortably no apparent distress.  Serial abdominal examinations remain benign.  She is tolerating p.o. fluids without difficulty.  Will discharge with course of Augmentin which will cover for possible UTI additionally as well as Zofran as needed for nausea.  Instructed patient to advance diet slowly.  She will follow-up with her primary care physician in the next 2 to 3 days for reevaluation of symptoms.  Discussed strict ED return precautions. Pt verbalized understanding of and agreement with plan and is safe for discharge home at this time.   Final Clinical Impressions(s) / ED Diagnoses   Final diagnoses:  Nausea vomiting and diarrhea  Right upper quadrant abdominal pain  Acute  cystitis with hematuria    ED Discharge Orders        Ordered    amoxicillin-clavulanate (AUGMENTIN) 875-125 MG tablet  Every 12 hours     01/26/18 2027    ondansetron (ZOFRAN ODT) 4 MG disintegrating tablet  Every 8 hours PRN     01/26/18 2027  Debroah Baller 01/26/18 2033    Merrily Pew, MD 01/26/18 2231

## 2018-01-26 NOTE — ED Triage Notes (Signed)
Per pt, states she saw PCP today for vomiting and abdominal pain-states she had Gall stones removed in March-PCP sent her here for GI work up-WBC 11.1

## 2018-01-26 NOTE — Discharge Instructions (Addendum)
1. Medications: Please take all of your antibiotics until finished!   You may develop abdominal discomfort or diarrhea from the antibiotic.  You may help offset this with probiotics which you can buy or get in yogurt. Do not eat  or take the probiotics until 2 hours after your antibiotic.  Alternate 600 mg of ibuprofen and [phone] mg of Tylenol every 3 hours as needed for pain. Do not exceed 4000 mg of Tylenol daily.  Take ibuprofen with food to avoid upset stomach.  Take Zofran as needed for nausea.  Wait around 20 minutes before eating or drinking after taking this medication. 2. Treatment: rest, drink plenty of fluids, advance diet slowly.  Start with water and broth then advance to bland foods that will not upset your stomach such as crackers, mashed potatoes, and peanut butter. 3. Follow Up: Please followup with your primary doctor in 3 days for discussion of your diagnoses and further evaluation after today's visit; if you do not have a primary care doctor use the resource guide provided to find one; Please return to the ER for persistent vomiting, high fevers or worsening symptoms

## 2018-01-28 LAB — URINE CULTURE

## 2019-02-19 ENCOUNTER — Other Ambulatory Visit: Payer: Self-pay

## 2019-02-19 ENCOUNTER — Encounter (HOSPITAL_COMMUNITY): Payer: Self-pay

## 2019-02-19 ENCOUNTER — Emergency Department (HOSPITAL_COMMUNITY): Payer: No Typology Code available for payment source

## 2019-02-19 ENCOUNTER — Emergency Department (HOSPITAL_COMMUNITY)
Admission: EM | Admit: 2019-02-19 | Discharge: 2019-02-19 | Disposition: A | Discharge status: Home / Self Care | Payer: No Typology Code available for payment source | Attending: Emergency Medicine | Admitting: Emergency Medicine

## 2019-02-19 DIAGNOSIS — Z20828 Contact with and (suspected) exposure to other viral communicable diseases: Secondary | ICD-10-CM | POA: Insufficient documentation

## 2019-02-19 DIAGNOSIS — S4992XA Unspecified injury of left shoulder and upper arm, initial encounter: Secondary | ICD-10-CM | POA: Diagnosis present

## 2019-02-19 DIAGNOSIS — W010XXA Fall on same level from slipping, tripping and stumbling without subsequent striking against object, initial encounter: Secondary | ICD-10-CM | POA: Insufficient documentation

## 2019-02-19 DIAGNOSIS — I739 Peripheral vascular disease, unspecified: Secondary | ICD-10-CM

## 2019-02-19 DIAGNOSIS — I10 Essential (primary) hypertension: Secondary | ICD-10-CM | POA: Diagnosis not present

## 2019-02-19 DIAGNOSIS — Y929 Unspecified place or not applicable: Secondary | ICD-10-CM | POA: Diagnosis not present

## 2019-02-19 DIAGNOSIS — Y939 Activity, unspecified: Secondary | ICD-10-CM | POA: Insufficient documentation

## 2019-02-19 DIAGNOSIS — S42202A Unspecified fracture of upper end of left humerus, initial encounter for closed fracture: Secondary | ICD-10-CM | POA: Diagnosis not present

## 2019-02-19 DIAGNOSIS — Y999 Unspecified external cause status: Secondary | ICD-10-CM | POA: Diagnosis not present

## 2019-02-19 DIAGNOSIS — R0602 Shortness of breath: Secondary | ICD-10-CM | POA: Insufficient documentation

## 2019-02-19 DIAGNOSIS — S2242XA Multiple fractures of ribs, left side, initial encounter for closed fracture: Secondary | ICD-10-CM | POA: Diagnosis not present

## 2019-02-19 DIAGNOSIS — M79671 Pain in right foot: Secondary | ICD-10-CM

## 2019-02-19 DIAGNOSIS — W19XXXA Unspecified fall, initial encounter: Secondary | ICD-10-CM

## 2019-02-19 DIAGNOSIS — R1010 Upper abdominal pain, unspecified: Secondary | ICD-10-CM | POA: Diagnosis not present

## 2019-02-19 DIAGNOSIS — I998 Other disorder of circulatory system: Secondary | ICD-10-CM | POA: Diagnosis not present

## 2019-02-19 LAB — CBC WITH DIFFERENTIAL/PLATELET
Abs Immature Granulocytes: 0.07 10*3/uL (ref 0.00–0.07)
Basophils Absolute: 0 10*3/uL (ref 0.0–0.1)
Basophils Relative: 0 %
Eosinophils Absolute: 0.1 10*3/uL (ref 0.0–0.5)
Eosinophils Relative: 1 %
HCT: 44.1 % (ref 36.0–46.0)
Hemoglobin: 14.3 g/dL (ref 12.0–15.0)
Immature Granulocytes: 1 %
Lymphocytes Relative: 20 %
Lymphs Abs: 2.9 10*3/uL (ref 0.7–4.0)
MCH: 28.4 pg (ref 26.0–34.0)
MCHC: 32.4 g/dL (ref 30.0–36.0)
MCV: 87.5 fL (ref 80.0–100.0)
Monocytes Absolute: 1 10*3/uL (ref 0.1–1.0)
Monocytes Relative: 7 %
Neutro Abs: 10.3 10*3/uL — ABNORMAL HIGH (ref 1.7–7.7)
Neutrophils Relative %: 71 %
Platelets: 263 10*3/uL (ref 150–400)
RBC: 5.04 MIL/uL (ref 3.87–5.11)
RDW: 14.7 % (ref 11.5–15.5)
WBC: 14.4 10*3/uL — ABNORMAL HIGH (ref 4.0–10.5)
nRBC: 0 % (ref 0.0–0.2)

## 2019-02-19 LAB — COMPREHENSIVE METABOLIC PANEL
ALT: 41 U/L (ref 0–44)
AST: 43 U/L — ABNORMAL HIGH (ref 15–41)
Albumin: 3.6 g/dL (ref 3.5–5.0)
Alkaline Phosphatase: 162 U/L — ABNORMAL HIGH (ref 38–126)
Anion gap: 7 (ref 5–15)
BUN: 8 mg/dL (ref 6–20)
CO2: 26 mmol/L (ref 22–32)
Calcium: 8.4 mg/dL — ABNORMAL LOW (ref 8.9–10.3)
Chloride: 104 mmol/L (ref 98–111)
Creatinine, Ser: 0.8 mg/dL (ref 0.44–1.00)
GFR calc Af Amer: >60 mL/min (ref >60)
GFR calc non Af Amer: >60 mL/min (ref >60)
Glucose, Bld: 84 mg/dL (ref 70–99)
Potassium: 3.9 mmol/L (ref 3.5–5.1)
Sodium: 137 mmol/L (ref 135–145)
Total Bilirubin: 0.6 mg/dL (ref 0.3–1.2)
Total Protein: 6.4 g/dL — ABNORMAL LOW (ref 6.5–8.1)

## 2019-02-19 MED ORDER — HYDROMORPHONE HCL 1 MG/ML IJ SOLN
1.0000 mg | Freq: Once | INTRAMUSCULAR | Status: AC
  Administered 2019-02-19: 1 mg via INTRAVENOUS
  Filled 2019-02-19: qty 1

## 2019-02-19 MED ORDER — IOHEXOL 300 MG/ML  SOLN
100.0000 mL | Freq: Once | INTRAMUSCULAR | Status: AC | PRN
  Administered 2019-02-19: 100 mL via INTRAVENOUS

## 2019-02-19 MED ORDER — ASPIRIN EC 81 MG PO TBEC
81.0000 mg | DELAYED_RELEASE_TABLET | Freq: Once | ORAL | Status: AC
  Administered 2019-02-19: 81 mg via ORAL
  Filled 2019-02-19: qty 1

## 2019-02-19 MED ORDER — ONDANSETRON 4 MG PO TBDP: 4.0000 mg | ORAL_TABLET | ORAL | 0 refills | Status: DC | PRN

## 2019-02-19 MED ORDER — ASPIRIN EC 81 MG PO TBEC: 81.0000 mg | DELAYED_RELEASE_TABLET | Freq: Every day | ORAL | 0 refills | Status: AC

## 2019-02-19 MED ORDER — HYDROCODONE-ACETAMINOPHEN 5-325 MG PO TABS
2.0000 | ORAL_TABLET | Freq: Once | ORAL | Status: AC
  Administered 2019-02-19: 23:00:00 2 via ORAL
  Filled 2019-02-19: qty 2

## 2019-02-19 MED ORDER — SODIUM CHLORIDE 0.9 % IV SOLN
1000.0000 mL | INTRAVENOUS | Status: DC
  Administered 2019-02-19: 18:00:00 1000 mL via INTRAVENOUS

## 2019-02-19 MED ORDER — FENTANYL CITRATE (PF) 100 MCG/2ML IJ SOLN
100.0000 ug | Freq: Once | INTRAMUSCULAR | Status: AC
  Administered 2019-02-19: 100 ug via INTRAVENOUS
  Filled 2019-02-19: qty 2

## 2019-02-19 MED ORDER — HYDROCODONE-ACETAMINOPHEN 5-325 MG PO TABS: 1.0000 | ORAL_TABLET | Freq: Four times a day (QID) | ORAL | 0 refills | Status: DC | PRN

## 2019-02-19 MED ORDER — SODIUM CHLORIDE (PF) 0.9 % IJ SOLN
INTRAMUSCULAR | Status: AC
  Filled 2019-02-19: qty 50

## 2019-02-19 MED ORDER — SODIUM CHLORIDE 0.9 % IV BOLUS (SEPSIS)
250.0000 mL | Freq: Once | INTRAVENOUS | Status: AC
  Administered 2019-02-19: 250 mL via INTRAVENOUS

## 2019-02-19 NOTE — ED Provider Notes (Signed)
[facility] DEPT Provider Note   CSN: [id number] Arrival date & time: 02/19/19  1453    History   Chief Complaint Chief Complaint  Patient presents with   Arm Injury    HPI Selena Farrell is a 58 y.o. female.     HPI Patient mechanical fall.  Her shoe got caught on the ground.  She tried to break her fall with her arms.  Left arm got caught beneath her and she heard a pop.  She has a lot of pain in her shoulder and elbow.  No head injury, no neck pain.  Some discomfort to the left side of the chest.  Shortness of breath or difficulty breathing.  No lower extremity pain or dysfunction.  Patient reports as an aside that she was having a problem with her right foot that she intended to see her doctor about.  Did not think it was significant relative to her other immediate problems from her fall.  However, after exam and more history, patient has signs of ischemia and peripheral vascular disease.  Ports for several weeks now she has been getting a lot of burning nighttime pain.  With walking she will get a aching muscle cramping on the right only in her pretibial area.  He has to rest. Past Medical History:  Diagnosis Date   Alcohol dependence [facility])    Quit Sept. 2012    Bronchitis    Choledocholithiasis 06/23/2011   ERCP, shincterotomy and baloon stone extraction by Dr Fuller Plan.    Cough 07/29/2013   Delayed gastric emptying    Depression    External hemorrhoid    Gastric ulcer 05/2011   EGD-Dr. Hilarie Fredrickson    Gastritis 05/2011   EGD-Dr. Hilarie Fredrickson    GERD (gastroesophageal reflux disease)    Helicobacter pylori (H. pylori) infection    Hx of    Hyperplastic colon polyp    Hypertension    Hyponatremia    Internal hemorrhoids    Nicotine dependence    Tenosynovitis    right [facility] joint    Patient Active Problem List   Diagnosis Date Noted   Common bile duct (CBD) obstruction 11/10/2017   Bile duct stone 11/10/2017   S/P  cholecystectomy 11/10/2017   Common bile duct calculus    Opioid type dependence ([place]) 12/29/2013    Class: Acute   MDD (major depressive disorder) 12/27/2013   Cough 07/29/2013   Unspecified constipation 12/25/2012   Nausea alone 12/25/2012   Diffuse abdominal pain 12/25/2012   Hemorrhoids 12/25/2012   Leukocytosis, unspecified 11/28/2012   Abdominal pain 11/27/2012   PUD (peptic ulcer disease) 06/15/2011   Tobacco abuse 93/81/8299   Helicobacter positive gastritis 06/14/2011    Past Surgical History:  Procedure Laterality Date   APPENDECTOMY     cervical neck fusion  03/2004   C5-C6 anterior discectomy and fusion with allograft   CESAREAN SECTION     x1   CHOLECYSTECTOMY N/A 11/29/2012   Procedure: LAPAROSCOPIC CHOLECYSTECTOMY WITH INTRAOPERATIVE CHOLANGIOGRAM;  Surgeon: Earnstine Regal, MD;  Location: [facility] ORS;  Service: General;  Laterality: N/A;   ERCP N/A 11/10/2017   Procedure: ENDOSCOPIC RETROGRADE CHOLANGIOPANCREATOGRAPHY (ERCP);  Surgeon: Irene Shipper, MD;  Location: [facility];  Service: Endoscopy;  Laterality: N/A;   ERCP W/ SPHICTEROTOMY  06/23/2011   ERCP, shincterotomy and baloon stone extraction by Dr Fuller Plan. 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    ESOPHAGOGASTRODUODENOSCOPY N/A 11/28/2012   Procedure: ESOPHAGOGASTRODUODENOSCOPY (EGD);  Surgeon: Lajuan Lines  Pyrtle, MD;  Location: [facility] ENDOSCOPY;  Service: Gastroenterology;  Laterality: N/A;     OB History   No obstetric history on file.      Home Medications    Prior to Admission medications   Medication Sig Start Date End Date Taking? Authorizing Provider  atenolol (TENORMIN) 25 MG tablet Take 1 tablet (25 mg total) by mouth daily. For High blood pressure 01/01/14  Yes Nwoko, Herbert Pun I, NP  buPROPion (WELLBUTRIN SR) 150 MG 12 hr tablet Take 150 mg by mouth daily.  10/01/18  Yes [provider]  gabapentin (NEURONTIN) 300 MG capsule Take 1 capsule (300 mg total) by mouth 3 (three)  times daily. For substance withdrawal syndrome/pain management Patient taking differently: Take 300 mg by mouth 2 (two) times daily. For substance withdrawal syndrome/pain management 01/01/14  Yes Lindell Spar I, NP  hydrOXYzine (ATARAX/VISTARIL) 25 MG tablet Take 25 mg (1 tablet) three times daily as needed for anxiety 01/01/14  Yes Nwoko, Agnes I, NP  Melatonin 5 MG TABS Take 1 tablet by mouth at bedtime.   Yes [provider]  omeprazole (PRILOSEC) 20 MG capsule TAKE 2 CAPSULES BY MOUTH EVERY DAY Patient taking differently: Take 20 mg by mouth 2 (two) times daily before a meal.    Yes Pyrtle, Lajuan Lines, MD  traZODone (DESYREL) 150 MG tablet Take 150 mg by mouth at bedtime.  11/06/17  Yes [provider]  aspirin EC 81 MG tablet Take 1 tablet (81 mg total) by mouth daily. 02/19/19   Charlesetta Shanks, MD  citalopram (CELEXA) 40 MG tablet Take 1 tablet (40 mg total) by mouth daily. For depression Patient not taking: Reported on 11/10/2017 01/01/14   Lindell Spar I, NP  docusate sodium (COLACE) 100 MG capsule Take 1 capsule (100 mg total) by mouth daily as needed for mild constipation. Patient not taking: Reported on 11/10/2017 01/01/14   Lindell Spar I, NP  HYDROcodone-acetaminophen (NORCO/VICODIN) 5-325 MG tablet Take 1-2 tablets by mouth every 6 (six) hours as needed for moderate pain or severe pain. 02/19/19   Charlesetta Shanks, MD  metoCLOPramide (REGLAN) 5 MG tablet Take 1 tablet (5 mg) three times daily & 2 tablets (10 mg) at bedtime: Acid reflux Patient not taking: Reported on 01/26/2018 01/01/14   Lindell Spar I, NP  nicotine (NICODERM CQ - DOSED IN MG/24 HOURS) 21 mg/24hr patch Place 1 patch (21 mg total) onto the skin daily. For Nicotine addiction Patient not taking: Reported on 10/27/2017 01/01/14   Lindell Spar I, NP  ondansetron (ZOFRAN ODT) 4 MG disintegrating tablet Take 1 tablet (4 mg total) by mouth every 8 (eight) hours as needed for nausea or vomiting. 01/26/18   Rodell Perna A, PA-C    ondansetron (ZOFRAN ODT) 4 MG disintegrating tablet Take 1 tablet (4 mg total) by mouth every 4 (four) hours as needed for nausea or vomiting. 02/19/19   Charlesetta Shanks, MD  temazepam (RESTORIL) 30 MG capsule Take 1 capsule (30 mg total) by mouth at bedtime as needed for sleep. Patient not taking: Reported on 01/26/2018 01/01/14   Encarnacion Slates, NP    Family History Family History  Problem Relation Age of Onset   Alcohol abuse Mother    Stroke Father    Cancer Maternal Grandfather        prostate   Aneurysm Sister    Colon cancer Neg Hx     Social History Social History   Tobacco Use   Smoking status: Current Every Day  Smoker    Packs/day: 0.50    Years: 25.00    Pack years: 12.50    Types: Cigarettes   Smokeless tobacco: Never Used   Tobacco comment: Counseling to quit smoking given to patient in exam room   Substance Use Topics   Alcohol use: No    Comment: Heavy drinker quit  Sept 2012    Drug use: No     Allergies   Aspirin; Celebrex [celecoxib]; Nsaids; and Vicodin [hydrocodone-acetaminophen]   Review of Systems Review of Systems 10 Systems reviewed and are negative for acute change except as noted in the HPI.   Physical Exam Updated Vital Signs BP 140/68    Pulse 70    Temp 98 F (36.7 C) (Oral)    Resp 16    Ht 5' 4.5" (1.638 m)    Wt 81.6 kg    SpO2 97%    BMI 30.42 kg/m   Physical Exam Constitutional:      Appearance: Normal appearance. She is well-developed.  HENT:     Head: Normocephalic and atraumatic.  Eyes:     Pupils: Pupils are equal, round, and reactive to light.  Neck:     Musculoskeletal: Neck supple.  Cardiovascular:     Rate and Rhythm: Normal rate and regular rhythm.     Heart sounds: Normal heart sounds.  Pulmonary:     Effort: Pulmonary effort is normal.     Breath sounds: Normal breath sounds.  Abdominal:     General: Bowel sounds are normal. There is no distension.     Palpations: Abdomen is soft.     Tenderness:  There is no abdominal tenderness.  Musculoskeletal:        General: Tenderness present.     Comments: Left shoulder very tender to palpation at the point of the shoulder.  No obvious deformity.  Also tenderness with any range of motion at the elbow.  No obvious deformity.  Forearm and wrist normal in appearance.  Brisk cap refill good neurovascular function.   Lower extremities without any acute injury. Patient identified the right foot is a problem pre-existing.  She has violaceous mottling of the toes and the dorsum of the foot.  Dorsalis pedis and posterior tibial pulses are not palpable on the right foot but are easily palpable on the left.  Right pedal pulses both DP and posterior tibial are present with hand-held Doppler.  See attached images.  Skin:    General: Skin is warm and dry.  Neurological:     Mental Status: She is alert and oriented to person, place, and time.     GCS: GCS eye subscore is 4. GCS verbal subscore is 5. GCS motor subscore is 6.     Coordination: Coordination normal.  Psychiatric:        Mood and Affect: Mood normal.        ED Treatments / Results  Labs (all labs ordered are listed, but only abnormal results are displayed) Labs Reviewed  COMPREHENSIVE METABOLIC PANEL - Abnormal; Notable for the following components:      Result Value   Calcium 8.4 (*)    Total Protein 6.4 (*)    AST 43 (*)    Alkaline Phosphatase 162 (*)    All other components within normal limits  CBC WITH DIFFERENTIAL/PLATELET - Abnormal; Notable for the following components:   WBC 14.4 (*)    Neutro Abs 10.3 (*)    All other components within normal limits  NOVEL CORONAVIRUS,  NAA (HOSPITAL ORDER, SEND-OUT TO REF LAB)    EKG None  Radiology Dg Chest 2 View  Result Date: 02/19/2019 CLINICAL DATA:  Fall. EXAM: CHEST - 2 VIEW COMPARISON:  Radiographs of Jan 29, 2016. FINDINGS: Stable cardiomediastinal silhouette. No pneumothorax or pleural effusion is noted. Minimal left  basilar subsegmental atelectasis is noted. Right basilar atelectasis or contusion is noted. Large opacity seen medially in the right lung which may represent atelectasis, but hematoma cannot be excluded. Minimally displaced proximal left humeral fracture is noted. Visualized ribs are unremarkable. IMPRESSION: Large opacity is seen medially in the right lung which may represent atelectasis, but hematoma cannot be excluded. Right basilar atelectasis or contusion is noted as well. CT scan of the chest is recommended for further evaluation. Minimally displaced proximal left humeral fracture is noted. Electronically Signed   By: Marijo Conception M.D.   On: 02/19/2019 17:01   Dg Elbow Complete Left  Result Date: 02/19/2019 CLINICAL DATA:  Fall, pain EXAM: LEFT SHOULDER - 2+ VIEW; LEFT ELBOW - COMPLETE 3+ VIEW COMPARISON:  None. FINDINGS: Minimally displaced fractures of the proximal left humerus which appears to involve the greater tuberosity and possibly the surgical neck without significant impaction. The glenohumeral joint and acromioclavicular joint remain in anatomic apposition. No displaced fractures of the left elbow identified. Joint spaces are preserved. There may be a small elbow joint effusion, suspicious for nondisplaced radial head or neck fracture. Correlate with point tenderness and consider CT or MRI to more sensitively evaluate if radial fracture is suspected. IMPRESSION: 1. Minimally displaced fractures of the proximal left humerus which appears to involve the greater tuberosity and possibly the surgical neck without significant impaction. The glenohumeral joint and acromioclavicular joint remain in anatomic apposition. 2. No displaced fractures of the left elbow identified. Joint spaces are preserved. There may be a small elbow joint effusion, suspicious for nondisplaced radial head or neck fracture. Correlate with point tenderness and consider CT or MRI to more sensitively evaluate if radial fracture  is suspected. Electronically Signed   By: Eddie Candle M.D.   On: 02/19/2019 17:02   Ct Chest W Contrast  Result Date: 02/19/2019 CLINICAL DATA:  Fall with left-sided rib cage and upper abdominal pain, left humerus fracture EXAM: CT CHEST, ABDOMEN, AND PELVIS WITH CONTRAST TECHNIQUE: Multidetector CT imaging of the chest, abdomen and pelvis was performed following the standard protocol during bolus administration of intravenous contrast. CONTRAST:  160mL OMNIPAQUE IOHEXOL 300 MG/ML  SOLN COMPARISON:  Radiographs 03/21/2019, CT 01/26/2018, 08/02/2013 FINDINGS: CT CHEST FINDINGS Cardiovascular: Common origin of the left common carotid and right brachiocephalic arteries. Direct origin of left vertebral artery from the arch. No aneurism. Mild aortic atherosclerosis. Mild cardiomegaly. Small water density pericardial effusion, measuring up to 2 cm in thickness on the right side. Mediastinum/Nodes: Midline trachea. Thickened thyroid isthmus. Subcentimeter thyroid nodules. No significant adenopathy. Negative for mediastinal hematoma. Esophagus within normal limits Lungs/Pleura: Negative for pneumothorax or pleural effusion. Dependent atelectasis at both bases. No focal consolidation. Linear atelectasis or scarring at the right middle lobe. Musculoskeletal: Sternum is intact. Acute mildly displaced fracture involving the greater tuberosity left humerus and the left humeral neck. Acute nondisplaced left eighth and ninth anterolateral rib fractures. CT ABDOMEN PELVIS FINDINGS Hepatobiliary: Status post cholecystectomy. Pneumobilia presumably related to prior sphincterotomy. Common bile duct diameter up to 9 mm. No focal hepatic abnormality Pancreas: Unremarkable. No pancreatic ductal dilatation or surrounding inflammatory changes. Spleen: Normal in size without focal abnormality. Adrenals/Urinary Tract: Adrenal glands are  unremarkable. Kidneys are normal, without renal calculi, focal lesion, or hydronephrosis. Bladder is  unremarkable. Stomach/Bowel: Stomach is within normal limits. No dilated small bowel. Prominent submucosal fat deposition throughout the colon, likely due to chronic inflammatory change. Vascular/Lymphatic: Nonaneurysmal aorta. Moderate aortic atherosclerosis. No significantly enlarged lymph nodes Reproductive: IUD in the uterus.  No adnexal mass Other: Negative for free air or free fluid Musculoskeletal: No acute or suspicious osseous lesion. IMPRESSION: 1. Patchy dependent atelectasis within the lungs. Negative for pneumothorax or pleural effusion. No CT evidence for acute intrathoracic abnormality 2. Cardiomegaly. Small quantity of pericardial effusion, present on previous exam 3. No CT evidence for acute intra-abdominal or pelvic abnormality 4. Acute nondisplaced left eighth and ninth anterolateral rib fractures. Acute left proximal humerus fracture Electronically Signed   By: Donavan Foil M.D.   On: 02/19/2019 20:16   Ct Abdomen Pelvis W Contrast  Result Date: 02/19/2019 CLINICAL DATA:  Fall with left-sided rib cage and upper abdominal pain, left humerus fracture EXAM: CT CHEST, ABDOMEN, AND PELVIS WITH CONTRAST TECHNIQUE: Multidetector CT imaging of the chest, abdomen and pelvis was performed following the standard protocol during bolus administration of intravenous contrast. CONTRAST:  138mL OMNIPAQUE IOHEXOL 300 MG/ML  SOLN COMPARISON:  Radiographs 03/21/2019, CT 01/26/2018, 08/02/2013 FINDINGS: CT CHEST FINDINGS Cardiovascular: Common origin of the left common carotid and right brachiocephalic arteries. Direct origin of left vertebral artery from the arch. No aneurism. Mild aortic atherosclerosis. Mild cardiomegaly. Small water density pericardial effusion, measuring up to 2 cm in thickness on the right side. Mediastinum/Nodes: Midline trachea. Thickened thyroid isthmus. Subcentimeter thyroid nodules. No significant adenopathy. Negative for mediastinal hematoma. Esophagus within normal limits  Lungs/Pleura: Negative for pneumothorax or pleural effusion. Dependent atelectasis at both bases. No focal consolidation. Linear atelectasis or scarring at the right middle lobe. Musculoskeletal: Sternum is intact. Acute mildly displaced fracture involving the greater tuberosity left humerus and the left humeral neck. Acute nondisplaced left eighth and ninth anterolateral rib fractures. CT ABDOMEN PELVIS FINDINGS Hepatobiliary: Status post cholecystectomy. Pneumobilia presumably related to prior sphincterotomy. Common bile duct diameter up to 9 mm. No focal hepatic abnormality Pancreas: Unremarkable. No pancreatic ductal dilatation or surrounding inflammatory changes. Spleen: Normal in size without focal abnormality. Adrenals/Urinary Tract: Adrenal glands are unremarkable. Kidneys are normal, without renal calculi, focal lesion, or hydronephrosis. Bladder is unremarkable. Stomach/Bowel: Stomach is within normal limits. No dilated small bowel. Prominent submucosal fat deposition throughout the colon, likely due to chronic inflammatory change. Vascular/Lymphatic: Nonaneurysmal aorta. Moderate aortic atherosclerosis. No significantly enlarged lymph nodes Reproductive: IUD in the uterus.  No adnexal mass Other: Negative for free air or free fluid Musculoskeletal: No acute or suspicious osseous lesion. IMPRESSION: 1. Patchy dependent atelectasis within the lungs. Negative for pneumothorax or pleural effusion. No CT evidence for acute intrathoracic abnormality 2. Cardiomegaly. Small quantity of pericardial effusion, present on previous exam 3. No CT evidence for acute intra-abdominal or pelvic abnormality 4. Acute nondisplaced left eighth and ninth anterolateral rib fractures. Acute left proximal humerus fracture Electronically Signed   By: Donavan Foil M.D.   On: 02/19/2019 20:16   Dg Shoulder Left  Result Date: 02/19/2019 CLINICAL DATA:  Fall, pain EXAM: LEFT SHOULDER - 2+ VIEW; LEFT ELBOW - COMPLETE 3+ VIEW  COMPARISON:  None. FINDINGS: Minimally displaced fractures of the proximal left humerus which appears to involve the greater tuberosity and possibly the surgical neck without significant impaction. The glenohumeral joint and acromioclavicular joint remain in anatomic apposition. No displaced fractures of the left elbow identified.  Joint spaces are preserved. There may be a small elbow joint effusion, suspicious for nondisplaced radial head or neck fracture. Correlate with point tenderness and consider CT or MRI to more sensitively evaluate if radial fracture is suspected. IMPRESSION: 1. Minimally displaced fractures of the proximal left humerus which appears to involve the greater tuberosity and possibly the surgical neck without significant impaction. The glenohumeral joint and acromioclavicular joint remain in anatomic apposition. 2. No displaced fractures of the left elbow identified. Joint spaces are preserved. There may be a small elbow joint effusion, suspicious for nondisplaced radial head or neck fracture. Correlate with point tenderness and consider CT or MRI to more sensitively evaluate if radial fracture is suspected. Electronically Signed   By: Eddie Candle M.D.   On: 02/19/2019 17:02    Procedures Procedures (including critical care time)  Medications Ordered in ED Medications  sodium chloride 0.9 % bolus 250 mL (0 mLs Intravenous Stopped 02/19/19 1932)    Followed by  0.9 %  sodium chloride infusion (1,000 mLs Intravenous New Bag/Given (Non-Interop) 02/19/19 1749)  sodium chloride (PF) 0.9 % injection (has no administration in time range)  HYDROcodone-acetaminophen (NORCO/VICODIN) 5-325 MG per tablet 2 tablet (has no administration in time range)  aspirin EC tablet 81 mg (has no administration in time range)  fentaNYL (SUBLIMAZE) injection 100 mcg (100 mcg Intravenous Given 02/19/19 1537)  fentaNYL (SUBLIMAZE) injection 100 mcg (100 mcg Intravenous Given 02/19/19 1750)  HYDROmorphone (DILAUDID)  injection 1 mg (1 mg Intravenous Given 02/19/19 1811)  iohexol (OMNIPAQUE) 300 MG/ML solution 100 mL (100 mLs Intravenous Contrast Given 02/19/19 1928)     Initial Impression / Assessment and Plan / ED Course  I have reviewed the triage vital signs and the nursing notes.  Pertinent labs & imaging results that were available during my care of the patient were reviewed by me and considered in my medical decision making (see chart for details).       Patient has acute injuries from fall.  She has a nondisplaced humerus fracture.  Neurovascularly intact.  She also has 2 rib fractures without any signs of pulmonary contusion or pneumothorax.  There was no associated head or neck injury with her fall.  Patient is managed with sling and pain control.  Follow-up plan is in place with orthopedics.  She is counseled on continued movement and deep inspiration to avoid complications of pneumonia.  Patient seems familiar with all of these concepts.  Her foot pain that she mentioned as an aside, actually is consistent with severe peripheral vascular disease that is prominent on the right foot only.  Dr. Trula Slade of vascular surgery has been consulted.  They will call the patient tomorrow to schedule an office follow-up within the next couple of days.  Patient is aware of the gravity of this finding and the importance of compliance with follow-up and the recommendation of discontinuation of smoking.  Patient has significant GI problems and ulcers but has never had a bleeding ulcer.  She will try a daily enteric-coated low-dose aspirin until further management by Vascular surgery.  Final Clinical Impressions(s) / ED Diagnoses   Final diagnoses:  Fall, initial encounter  Closed fracture of proximal end of left humerus, unspecified fracture morphology, initial encounter  Closed fracture of multiple ribs of left side, initial encounter  PAD (peripheral artery disease) (HCC)  Ischemic pain of foot, right    ED  Discharge Orders         Ordered    HYDROcodone-acetaminophen (NORCO/VICODIN)  5-325 MG tablet  Every 6 hours PRN     02/19/19 2221    ondansetron (ZOFRAN ODT) 4 MG disintegrating tablet  Every 4 hours PRN     02/19/19 2221    aspirin EC 81 MG tablet  Daily     02/19/19 2225           Charlesetta Shanks, MD 02/19/19 2239

## 2019-02-19 NOTE — ED Triage Notes (Signed)
Pt BIBA from work where she tripped and fell on to left arm, pt reports "hearing a pop."  Pt denies hitting her head, denies LOC, does not take blood thinners.   Pt in [place] on arrival to ED    EMS gave total 4 mg Zofran, 150 mcg Fentanyl, last dose at 1428.

## 2019-02-19 NOTE — ED Notes (Signed)
Bed: WA01 Expected date:  Expected time:  Means of arrival:  Comments: EMS 57yo arm fx fentanyl on board fall at work

## 2019-02-19 NOTE — Discharge Instructions (Signed)
1.  The most serious problem that was evaluated in the emergency department today is your pre-existing foot problem.  This is due to occlusions in your blood vessels serving the foot.  This will become much worse without treatment.  You should be called tomorrow by the vascular surgery office listed in your discharge instructions.  If you have not been called by mid afternoon, call tomorrow to be seen within the next couple of days.  Take a daily and coated aspirin until you are seen. 2.  You may wear your sling for your arm fracture.  You may apply an ice pack over the most uncomfortable area of the shoulder.  Follow-up with orthopedic doctor outlined in your discharge instructions. 3.  You have 2 rib fractures.  It is important that you continue to take deep breaths, cough and move.  Failure to do these things could result in pneumonia.  It is very important that you quit smoking. 4.  Return to the emergency department immediately if you are having worsening symptoms either with your foot, chest or arm.

## 2019-02-21 ENCOUNTER — Telehealth: Payer: Self-pay | Admitting: Orthopedic Surgery

## 2019-02-21 ENCOUNTER — Ambulatory Visit: Payer: BC Managed Care – PPO | Admitting: Orthopedic Surgery

## 2019-02-21 ENCOUNTER — Other Ambulatory Visit: Payer: Self-pay

## 2019-02-21 DIAGNOSIS — I739 Peripheral vascular disease, unspecified: Secondary | ICD-10-CM

## 2019-02-21 LAB — NOVEL CORONAVIRUS, NAA (HOSP ORDER, SEND-OUT TO REF LAB; TAT 18-24 HRS): SARS-CoV-2, NAA: NOT DETECTED

## 2019-02-21 NOTE — Telephone Encounter (Signed)
Called patient left message to return call to reschedule appointment    Per voicemail message from patient's neighbor  Patient needed to cancel appointment today  And reschedule

## 2019-02-22 ENCOUNTER — Ambulatory Visit (INDEPENDENT_AMBULATORY_CARE_PROVIDER_SITE_OTHER): Payer: BC Managed Care – PPO | Admitting: Family Medicine

## 2019-02-22 ENCOUNTER — Telehealth: Payer: Self-pay

## 2019-02-22 ENCOUNTER — Encounter: Payer: Self-pay | Admitting: Family

## 2019-02-22 ENCOUNTER — Ambulatory Visit: Payer: BC Managed Care – PPO | Admitting: Physician Assistant

## 2019-02-22 ENCOUNTER — Other Ambulatory Visit: Payer: Self-pay

## 2019-02-22 ENCOUNTER — Encounter: Payer: Self-pay | Admitting: Family Medicine

## 2019-02-22 ENCOUNTER — Ambulatory Visit (HOSPITAL_COMMUNITY): Payer: BC Managed Care – PPO | Attending: Family

## 2019-02-22 DIAGNOSIS — Z720 Tobacco use: Secondary | ICD-10-CM

## 2019-02-22 DIAGNOSIS — S2242XA Multiple fractures of ribs, left side, initial encounter for closed fracture: Secondary | ICD-10-CM

## 2019-02-22 DIAGNOSIS — S42255A Nondisplaced fracture of greater tuberosity of left humerus, initial encounter for closed fracture: Secondary | ICD-10-CM

## 2019-02-22 MED ORDER — HYDROMORPHONE HCL 2 MG PO TABS: 1.0000 mg | ORAL_TABLET | Freq: Four times a day (QID) | ORAL | 0 refills | Status: DC | PRN

## 2019-02-22 NOTE — Patient Instructions (Signed)
    Bones:  - Taper cigarettes.  - Vitamin D3:  Take 5,000 IU daily  - Vitamin K2:  Take 100 mcg daily  - Magnesium:  Take 400 mg daily

## 2019-02-22 NOTE — Telephone Encounter (Signed)
The patient had called [place] to let us know that she had gotten a message that the hydromorphone needed a prior authorization. I called [facility] in [place], speaking with Vicente Males: the patient can only have a 7-day Rx for hydromorphone without need for PA. So, she will fill only #14 of this. We will be seeing the patient again in 7 days and can give another Rx at that time if needed. I have advised the patient of this.

## 2019-02-22 NOTE — Progress Notes (Signed)
Office Visit Note   Patient: Selena Farrell           Date of Birth: 10/22/1960           MRN: [id number] Visit Date: 02/22/2019 Requested by: Jani Gravel, MD [address] [place],  [place] [id number] PCP: Jani Gravel, MD  Subjective: Chief Complaint  Patient presents with  . Left Shoulder - Injury, Pain    DOI 02/19/2019. Fell while at work, landing on left side with arm overhead. Fractured humerus and 2 ribs on the left. In armsling/swathe.    HPI: She is here with left shoulder and rib cage pain.  On June 9 she was at work walking when her shoe caught the ground and she tripped falling forward with her left arm outstretched.  Immediate severe pain in her shoulder, elbow, and rib cage.  She went to the hospital where x-rays of her shoulder revealed a nondisplaced greater tuberosity humerus/humeral neck fracture, elbow x-rays were unrevealing, and chest CT scan revealed left-sided eighth and ninth rib fractures.  Was given a shoulder sling with hydrocodone for pain.  In the ER she was given Dilaudid with good relief and no side effects but hydrocodone is making her very nauseated.  She is taking Zofran along with it.  She is right-hand dominant.  Denies any previous left shoulder problems.  Denies any current numbness or tingling in her left hand.  She does smoke cigarettes and is trying to quit.  She has tapered down to 1 pack daily.  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  She was able to wean herself off it several years ago.  She is concerned about possibility of that with this injury.                ROS: Denies respiratory symptoms.  All other systems were reviewed and are negative.  Objective: Vital Signs: There were no vitals taken for this visit.  Physical Exam:  General:  Alert and oriented, in no acute distress. Pulm:  Breathing unlabored. Psy:  Normal mood, congruent affect. Skin: No visible bruising or rash. Left shoulder: Did not put her  through any active range of motion.  She is tender to palpation in the lateral subacromial space.  No tenderness at the clavicle or the [facility] joint.  No numbness to light touch. Left elbow: Able to fully extend and flex.  No tenderness to palpation around the joint and no detectable effusion.  Imaging: X-rays from the hospital reviewed on computer show minimally displaced/acceptably aligned avulsion fracture of the proximal humerus/greater tuberosity.  No definite elbow fracture seen.  Nondisplaced left eighth and ninth rib fractures.  Assessment & Plan: 1.  3 days status post fall with left shoulder greater tuberosity/humeral neck fracture, acceptably aligned. -Sarmiento brace for comfort, remove sling daily to gently work on elbow range of motion to prevent stiffness. -Return in 1 week for repeat shoulder x-rays to assess fracture alignment. -Strongly encouraged her to work on tapering off cigarettes. -Vitamin D3, vitamin K2 and magnesium for bone strength. -Next week if x-rays look stable, we will likely initiate gentle pendulum exercises. -Short-term prescription for Dilaudid to use sparingly.  She tolerated this in the ER without nausea.  2.  Left eighth and ninth rib fractures, nondisplaced. -No specific treatment for this.     Procedures: No procedures performed  No notes on file     PMFS History: Patient Active Problem List   Diagnosis Date Noted  .  Common bile duct (CBD) obstruction 11/10/2017  . Bile duct stone 11/10/2017  . S/P cholecystectomy 11/10/2017  . Common bile duct calculus   . Opioid type dependence ([place]) 12/29/2013    Class: Acute  . MDD (major depressive disorder) 12/27/2013  . Cough 07/29/2013  . Unspecified constipation 12/25/2012  . Nausea alone 12/25/2012  . Diffuse abdominal pain 12/25/2012  . Hemorrhoids 12/25/2012  . Leukocytosis, unspecified 11/28/2012  . Abdominal pain 11/27/2012  . PUD (peptic ulcer disease) 06/15/2011  . Tobacco abuse  06/14/2011  . Helicobacter positive gastritis 06/14/2011   Past Medical History:  Diagnosis Date  . Alcohol dependence ([place])    Quit Sept. 2012   . Bronchitis   . Choledocholithiasis 06/23/2011   ERCP, shincterotomy and baloon stone extraction by Dr Fuller Plan.   . Cough 07/29/2013  . Delayed gastric emptying   . Depression   . External hemorrhoid   . Gastric ulcer 05/2011   EGD-Dr. Hilarie Fredrickson   . Gastritis 05/2011   EGD-Dr. Hilarie Fredrickson   . GERD (gastroesophageal reflux disease)   . Helicobacter pylori (H. pylori) infection    Hx of   . Hyperplastic colon polyp   . Hypertension   . Hyponatremia   . Internal hemorrhoids   . Nicotine dependence   . Tenosynovitis    right MC joint    Family History  Problem Relation Age of Onset  . Alcohol abuse Mother   . Stroke Father   . Cancer Maternal Grandfather        prostate  . Aneurysm Sister   . Colon cancer Neg Hx     Past Surgical History:  Procedure Laterality Date  . APPENDECTOMY    . cervical neck fusion  03/2004   C5-C6 anterior discectomy and fusion with allograft  . CESAREAN SECTION     x1  . CHOLECYSTECTOMY N/A 11/29/2012   Procedure: LAPAROSCOPIC CHOLECYSTECTOMY WITH INTRAOPERATIVE CHOLANGIOGRAM;  Surgeon: Earnstine Regal, MD;  Location: [facility];  Service: General;  Laterality: N/A;  . ERCP N/A 11/10/2017   Procedure: ENDOSCOPIC RETROGRADE CHOLANGIOPANCREATOGRAPHY (ERCP);  Surgeon: Irene Shipper, MD;  Location: [facility];  Service: Endoscopy;  Laterality: N/A;  . ERCP W/ SPHICTEROTOMY  06/23/2011   ERCP, shincterotomy and baloon stone extraction by Dr Fuller Plan. 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   . ESOPHAGOGASTRODUODENOSCOPY N/A 11/28/2012   Procedure: ESOPHAGOGASTRODUODENOSCOPY (EGD);  Surgeon: Jerene Bears, MD;  Location: [facility];  Service: Gastroenterology;  Laterality: N/A;   Social History   Occupational History    Employer: [employer]    Comment: Environmental consultant  Tobacco Use  . Smoking  status: Current Every Day Smoker    Packs/day: 0.50    Years: 25.00    Pack years: 12.50    Types: Cigarettes  . Smokeless tobacco: Never Used  . Tobacco comment: Counseling to quit smoking given to patient in exam room   Substance and Sexual Activity  . Alcohol use: No    Comment: Heavy drinker quit  Sept 2012   . Drug use: No  . Sexual activity: Never    Birth control/protection: I.U.D.

## 2019-03-01 ENCOUNTER — Ambulatory Visit: Payer: BC Managed Care – PPO | Admitting: Family Medicine

## 2019-03-05 ENCOUNTER — Other Ambulatory Visit: Payer: Self-pay

## 2019-03-05 ENCOUNTER — Ambulatory Visit (INDEPENDENT_AMBULATORY_CARE_PROVIDER_SITE_OTHER): Payer: BC Managed Care – PPO | Admitting: Physician Assistant

## 2019-03-05 ENCOUNTER — Other Ambulatory Visit: Payer: Self-pay | Admitting: *Deleted

## 2019-03-05 ENCOUNTER — Ambulatory Visit (HOSPITAL_COMMUNITY)
Admission: RE | Admit: 2019-03-05 | Discharge: 2019-03-05 | Disposition: A | Discharge status: Home / Self Care | Payer: BC Managed Care – PPO | Source: Ambulatory Visit | Attending: Vascular Surgery | Admitting: Vascular Surgery

## 2019-03-05 VITALS — BP 126/72 | HR 72 | Temp 97.7°F | Resp 14 | Ht 64.0 in | Wt 173.3 lb

## 2019-03-05 DIAGNOSIS — I739 Peripheral vascular disease, unspecified: Secondary | ICD-10-CM | POA: Diagnosis present

## 2019-03-05 NOTE — Progress Notes (Signed)
HISTORY AND PHYSICAL     CC:  follow up. Requesting Provider:  Jani Gravel, MD  HPI: This is a 58 y.o. female who is here today for follow up.  She states that about 3 weeks ago, she fell at work and broke her left arm and a couple of ribs.  She presented to the ED.  While there, she asked them about her right leg.    She tells me that she has been having cramping with walking in her right hip, thigh and calf for about 4-6 months.  She states that she can walk about a city block before having to stop and rest.  She states that when she starts back, she can walk about the same distance before having to rest again.  She states that about 3-4 weeks ago, she started having pain & some swelling in her right foot especially at night.  She states that her toes would hurt.  She states that she started taking as asa daily and this helped with her pain.  She states that she would use some heat on her foot and she did get a little relief from that as well.  She did not get any relief in her foot from pain medication.  She denies having a recent injury to her right foot.  She states that she had a sheppard dog jump on her foot a couple of times, but no injuries from this.  She has a hx of a broken right foot in the past.  She does not have any non healing wounds on her feet.  She does not have any issues on the left foot or claudication sx on the left.   She does have plantar fascitis in the right heel.    She denies any hx of MI.  She states when she feels anxious & her blood pressure is up, she does have some chest pressure.  She denies hx of stroke.  She does have hx of PUD and H pylori gastritis in the past.  She is taking a daily asa and is tolerating this.    The pt is not on a statin for cholesterol management.    The pt is on an aspirin.    Other AC:   The pt is on beta blocker for hypertension.  The pt does not have diabetes. Tobacco hx:  Current.  She is trying to quit and has cut back to 15  cigarettes per day since last week.     Past Medical History:  Diagnosis Date  . Alcohol dependence ([place])    Quit Sept. 2012   . Bronchitis   . Choledocholithiasis 06/23/2011   ERCP, shincterotomy and baloon stone extraction by Dr Fuller Plan.   . Cough 07/29/2013  . Delayed gastric emptying   . Depression   . External hemorrhoid   . Gastric ulcer 05/2011   EGD-Dr. Hilarie Fredrickson   . Gastritis 05/2011   EGD-Dr. Hilarie Fredrickson   . GERD (gastroesophageal reflux disease)   . Helicobacter pylori (H. pylori) infection    Hx of   . Hyperplastic colon polyp   . Hypertension   . Hyponatremia   . Internal hemorrhoids   . Nicotine dependence   . Tenosynovitis    right MC joint    Past Surgical History:  Procedure Laterality Date  . APPENDECTOMY    . cervical neck fusion  03/2004   C5-C6 anterior discectomy and fusion with allograft  . CESAREAN SECTION     x1  .  CHOLECYSTECTOMY N/A 11/29/2012   Procedure: LAPAROSCOPIC CHOLECYSTECTOMY WITH INTRAOPERATIVE CHOLANGIOGRAM;  Surgeon: Earnstine Regal, MD;  Location: [facility];  Service: General;  Laterality: N/A;  . ERCP N/A 11/10/2017   Procedure: ENDOSCOPIC RETROGRADE CHOLANGIOPANCREATOGRAPHY (ERCP);  Surgeon: Irene Shipper, MD;  Location: [facility];  Service: Endoscopy;  Laterality: N/A;  . ERCP W/ SPHICTEROTOMY  06/23/2011   ERCP, shincterotomy and baloon stone extraction by Dr Fuller Plan. 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   . ESOPHAGOGASTRODUODENOSCOPY N/A 11/28/2012   Procedure: ESOPHAGOGASTRODUODENOSCOPY (EGD);  Surgeon: Jerene Bears, MD;  Location: [facility];  Service: Gastroenterology;  Laterality: N/A;    Allergies  Allergen Reactions  . Aspirin Other (See Comments)    Pt has bleeding ulcer, not allowed to take aspirin  . Celebrex [Celecoxib] Nausea Only  . Nsaids Nausea Only  . Vicodin [Hydrocodone-Acetaminophen] Nausea And Vomiting    Can take with antiemetics.    Current Outpatient Medications  Medication Sig Dispense Refill   . aspirin EC 81 MG tablet Take 1 tablet (81 mg total) by mouth daily. 30 tablet 0  . atenolol (TENORMIN) 25 MG tablet Take 1 tablet (25 mg total) by mouth daily. For High blood pressure    . buPROPion (WELLBUTRIN SR) 150 MG 12 hr tablet Take 150 mg by mouth daily.     . citalopram (CELEXA) 40 MG tablet Take 1 tablet (40 mg total) by mouth daily. For depression (Patient not taking: Reported on 11/10/2017) 30 tablet 0  . docusate sodium (COLACE) 100 MG capsule Take 1 capsule (100 mg total) by mouth daily as needed for mild constipation. (Patient not taking: Reported on 11/10/2017) 10 capsule 0  . gabapentin (NEURONTIN) 300 MG capsule Take 1 capsule (300 mg total) by mouth 3 (three) times daily. For substance withdrawal syndrome/pain management (Patient taking differently: Take 300 mg by mouth 2 (two) times daily. For substance withdrawal syndrome/pain management) 90 capsule 0  . HYDROcodone-acetaminophen (NORCO/VICODIN) 5-325 MG tablet Take 1-2 tablets by mouth every 6 (six) hours as needed for moderate pain or severe pain. 20 tablet 0  . HYDROmorphone (DILAUDID) 2 MG tablet Take 0.5 tablets (1 mg total) by mouth every 6 (six) hours as needed for severe pain. 20 tablet 0  . hydrOXYzine (ATARAX/VISTARIL) 25 MG tablet Take 25 mg (1 tablet) three times daily as needed for anxiety 90 tablet 0  . Melatonin 5 MG TABS Take 1 tablet by mouth at bedtime.    . metoCLOPramide (REGLAN) 5 MG tablet Take 1 tablet (5 mg) three times daily & 2 tablets (10 mg) at bedtime: Acid reflux (Patient not taking: Reported on 01/26/2018) 150 tablet 0  . nicotine (NICODERM CQ - DOSED IN MG/24 HOURS) 21 mg/24hr patch Place 1 patch (21 mg total) onto the skin daily. For Nicotine addiction (Patient not taking: Reported on 10/27/2017) 28 patch 0  . omeprazole (PRILOSEC) 20 MG capsule TAKE 2 CAPSULES BY MOUTH EVERY DAY (Patient taking differently: Take 20 mg by mouth 2 (two) times daily before a meal. ) 60 capsule 2  . ondansetron (ZOFRAN  ODT) 4 MG disintegrating tablet Take 1 tablet (4 mg total) by mouth every 8 (eight) hours as needed for nausea or vomiting. 10 tablet 0  . ondansetron (ZOFRAN ODT) 4 MG disintegrating tablet Take 1 tablet (4 mg total) by mouth every 4 (four) hours as needed for nausea or vomiting. 20 tablet 0  . temazepam (RESTORIL) 30 MG capsule Take 1 capsule (30 mg total) by mouth at  bedtime as needed for sleep. (Patient not taking: Reported on 01/26/2018) 14 capsule 0  . traZODone (DESYREL) 150 MG tablet Take 150 mg by mouth at bedtime.   5   No current facility-administered medications for this visit.     Family History  Problem Relation Age of Onset  . Alcohol abuse Mother   . Stroke Father   . Cancer Maternal Grandfather        prostate  . Aneurysm Sister   . Colon cancer Neg Hx     Social History   Socioeconomic History  . Marital status: Widowed    Spouse name: Not on file  . Number of children: 1  . Years of education: GED  . 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 Financial resource strain: Not on file  . Food insecurity    Worry: Not on file    Inability: Not on file  . Transportation needs    Medical: Not on file    Non-medical: Not on file  Tobacco Use  . Smoking status: Current Every Day Smoker    Packs/day: 0.50    Years: 25.00    Pack years: 12.50    Types: Cigarettes  . Smokeless tobacco: Never Used  . Tobacco comment: Counseling to quit smoking given to patient in exam room   Substance and Sexual Activity  . Alcohol use: No    Comment: Heavy drinker quit  Sept 2012   . Drug use: No  . Sexual activity: Never    Birth control/protection: I.U.D.  Lifestyle  . Physical activity    Days per week: Not on file    Minutes per session: Not on file  . Stress: Not on file  Relationships  . Social Herbalist on phone: Not on file    Gets together: Not on file     Attends religious service: Not on file    Active member of club or organization: Not on file    Attends meetings of clubs or organizations: Not on file    Relationship status: Not on file  . Intimate partner violence    Fear of current or ex partner: Not on file    Emotionally abused: Not on file    Physically abused: Not on file    Forced sexual activity: Not on file  Other Topics Concern  . Not on file  Social History Narrative   Patient is widowed.   Patient is living alone.   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   Patient drinks 3-4 cups of caffeine daily.   Patient has her GED.   Patient is right-handed.     REVIEW OF SYSTEMS:   [X]  denotes positive finding, [ ]  denotes negative finding Cardiac  Comments:  Chest pain or chest pressure:    Shortness of breath upon exertion:    Short of breath when lying flat:    Irregular heart rhythm:        Vascular    Pain in calf, thigh, or hip brought on by ambulation: x See HPI  Pain in feet at night that wakes you up from your sleep:  x   Blood clot in your veins:    Leg swelling:         Pulmonary    Oxygen at home:    Productive cough:  Wheezing:         Neurologic    Sudden weakness in arms or legs:     Sudden numbness in arms or legs:     Sudden onset of difficulty speaking or slurred speech:    Temporary loss of vision in one eye:     Problems with dizziness:         Gastrointestinal    Blood in stool:     Vomited blood:         Genitourinary    Burning when urinating:     Blood in urine:        Psychiatric    Major depression:         Hematologic    Bleeding problems:    Problems with blood clotting too easily:        Skin    Rashes or ulcers:        Constitutional    Fever or chills:      PHYSICAL EXAMINATION:  Today's Vitals   03/05/19 1414  BP: 126/72  Pulse: 72  Resp: 14  Temp: 97.7 F (36.5 C)  TempSrc: Temporal  SpO2: 95%  Weight: 173 lb 4.8 oz (78.6 kg)  Height:  5\' 4"  (1.626 m)  PainSc: 1    Body mass index is 29.75 kg/m.   General:  WDWN in NAD; vital signs documented above Gait: Not observed HENT: WNL, normocephalic Pulmonary: normal non-labored breathing , without Rales, rhonchi,  wheezing Cardiac: regular HR, without  Murmurs; without carotid bruits Abdomen: soft, NT, no masses Skin: without rashes Vascular Exam/Pulses:  Right Left  Radial 2+ (normal) 2+ (normal)  Ulnar 1+ (weak) 1+ (weak)  Femoral Unable to palpate  1+ (weak)  Popliteal Unable to palpate  Unable to palpate   DP Unable to palpate  2+ (normal)  PT Unable to palpate  Unable to palpate    Extremities: left foot normal and warm and well perfused.  Right foot with decreased temperature and discoloration of her toes.    Musculoskeletal: no muscle wasting or atrophy  Neurologic: A&O X 3;  No focal weakness or paresthesias are detected Psychiatric:  The pt has Normal affect.   Non-Invasive Vascular Imaging:   ABI's/TBI's on 03/05/2019: Right:  0.68 Left:  1.01    ASSESSMENT/PLAN:: 58 y.o. female here for follow up for with 4-6 month hx of RLE claudication and ~ 3 week hx of new right foot/toe pain.  -pt with decreased ABI on the right, also with decreased temperature on the right.  She has hx of 4-6 month claudication, but only 3 week hx of pain in the foot and toes.  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  Discussed with pt proceeding with walking program and importance of smoking cessation vs proceeding with angiogram.  Pt also discussed with Dr. Carlis Abbott.  He discussed possibility that she could be embolizing to her toes.  Despite this, she would like to continue her daily asa, walking program and smoking cessation.    -will have her f/u in 3 months with ABI's with Dr. Carlis Abbott.  She will call sooner should she develop worsening of her right foot pain or develops non healing wounds.  -would benefit from statin but will defer to PCP given medical hx  per pt.    Leontine Locket, PA-C [facility] [phone]  Clinic MD:   Pt seen and examined with Dr. Carlis Abbott.

## 2019-03-12 ENCOUNTER — Encounter (HOSPITAL_COMMUNITY): Payer: BC Managed Care – PPO

## 2019-03-12 ENCOUNTER — Ambulatory Visit: Payer: BC Managed Care – PPO | Admitting: Vascular Surgery

## 2019-05-23 ENCOUNTER — Telehealth: Payer: Self-pay

## 2019-05-23 NOTE — Telephone Encounter (Signed)
I called the patient so see how she is doing, as we have not seen her back for her left shoulder and rib fractures (was seen on 02/22/19, DOI 02/19/19). I reached her voice mail: asked her to call back with at least a progress report, but would rather she schedule a follow up office visit with Dr. Junius Roads. Will await return call.

## 2019-06-10 ENCOUNTER — Other Ambulatory Visit: Payer: Self-pay

## 2019-06-10 DIAGNOSIS — I739 Peripheral vascular disease, unspecified: Secondary | ICD-10-CM

## 2019-06-11 ENCOUNTER — Ambulatory Visit (HOSPITAL_COMMUNITY): Payer: BC Managed Care – PPO | Attending: Vascular Surgery

## 2019-06-11 ENCOUNTER — Ambulatory Visit: Payer: BC Managed Care – PPO | Admitting: Vascular Surgery

## 2020-07-22 ENCOUNTER — Ambulatory Visit: Payer: BC Managed Care – PPO | Admitting: Nurse Practitioner

## 2021-04-16 ENCOUNTER — Other Ambulatory Visit: Payer: Self-pay

## 2021-04-16 ENCOUNTER — Ambulatory Visit (INDEPENDENT_AMBULATORY_CARE_PROVIDER_SITE_OTHER): Payer: BC Managed Care – PPO | Admitting: Nurse Practitioner

## 2021-04-16 ENCOUNTER — Other Ambulatory Visit (INDEPENDENT_AMBULATORY_CARE_PROVIDER_SITE_OTHER): Payer: BC Managed Care – PPO

## 2021-04-16 ENCOUNTER — Encounter: Payer: Self-pay | Admitting: Nurse Practitioner

## 2021-04-16 DIAGNOSIS — R112 Nausea with vomiting, unspecified: Secondary | ICD-10-CM

## 2021-04-16 DIAGNOSIS — R1011 Right upper quadrant pain: Secondary | ICD-10-CM

## 2021-04-16 DIAGNOSIS — K649 Unspecified hemorrhoids: Secondary | ICD-10-CM

## 2021-04-16 DIAGNOSIS — K5900 Constipation, unspecified: Secondary | ICD-10-CM

## 2021-04-16 DIAGNOSIS — R748 Abnormal levels of other serum enzymes: Secondary | ICD-10-CM

## 2021-04-16 DIAGNOSIS — K625 Hemorrhage of anus and rectum: Secondary | ICD-10-CM

## 2021-04-16 LAB — COMPREHENSIVE METABOLIC PANEL
ALT: 24 U/L (ref 0–35)
AST: 21 U/L (ref 0–37)
Albumin: 4.1 g/dL (ref 3.5–5.2)
Alkaline Phosphatase: 159 U/L — ABNORMAL HIGH (ref 39–117)
BUN: 8 mg/dL (ref 6–23)
CO2: 32 mEq/L (ref 19–32)
Calcium: 9.2 mg/dL (ref 8.4–10.5)
Chloride: 99 mEq/L (ref 96–112)
Creatinine, Ser: 1.07 mg/dL (ref 0.40–1.20)
GFR: 56.23 mL/min — ABNORMAL LOW (ref >60.00)
Glucose, Bld: 97 mg/dL (ref 70–99)
Potassium: 4.2 mEq/L (ref 3.5–5.1)
Sodium: 137 mEq/L (ref 135–145)
Total Bilirubin: 0.5 mg/dL (ref 0.2–1.2)
Total Protein: 7 g/dL (ref 6.0–8.3)

## 2021-04-16 LAB — CBC
HCT: 42.7 % (ref 36.0–46.0)
Hemoglobin: 14 g/dL (ref 12.0–15.0)
MCHC: 32.8 g/dL (ref 30.0–36.0)
MCV: 87.6 fl (ref 78.0–100.0)
Platelets: 284 10*3/uL (ref 150.0–400.0)
RBC: 4.88 Mil/uL (ref 3.87–5.11)
RDW: 13.7 % (ref 11.5–15.5)
WBC: 15.3 10*3/uL — ABNORMAL HIGH (ref 4.0–10.5)

## 2021-04-16 MED ORDER — HYDROCORTISONE (PERIANAL) 2.5 % EX CREA: 1.0000 "application " | TOPICAL_CREAM | Freq: Every day | CUTANEOUS | 1 refills | Status: AC

## 2021-04-16 NOTE — Progress Notes (Signed)
ASSESSMENT AND PLAN    # 60 yo female with recurrent radiating RUQ pain / burning mid upper abdominal pain and nausea   RUQ pain reminiscent of when she had choledocholithiasis in 2019 however I suspect some of the pain may be musculoskeletal .   Etiology of postprandial mid upper abdominal burning and nausea unclear. She has a remote history of H.pylori related gastritis ( treated) and PUD. Takes a daily PPI --RUQ pain seems to be musculoskeletal in nature but will obtain hepatic function panel, CBC.  --Arrange for EGD for further evaluation of upper GI symptoms. The risks and benefits of EGD with possible biopsies were discussed with the patient who agrees to proceed.   # Bowel changes with constipation. She has typically had postprandial loose stool since cholecystectomy years ago --Start daily MiraLAX 1 capful in 8 ounces of water.   # Rectal bleeding with bowel movements, most likely hemorrhoids based on the history that she gives.  She is concerned about colon cancer --It has been several years since her last colonoscopy in 2014.  I think it is reasonable to proceed with another colonoscopy to evaluate the rectal bleeding.  If bleeding proves to be from internal hemorrhoids then consider banding at later date -- Anusol cream per rectum nightly x10 days  HISTORY OF PRESENT ILLNESS     Chief Complaint : Right upper abdominal pain, nausea, constipation, rectal bleeding  Selena Farrell is a 60 y.o. 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 See PMH below for any additional history.   Selena Farrell was last seen in 2019.  She has since retired from [employer] . She comes in today with multiple GI  concerns.  First, she has been having some RUQ pain as when we saw her in 2019 with choledocholithiasis.  The pain has been present for about 6 months now.  She describes RUQ pain radiating around to her right back.  Interestingly the pain is not really related to eating but drinking can exacerbate the pain.  She gets these episodes of pain about 3 times a week, unrelated to physical activity.  Stretching helps the pain.  She describes frequent nausea without vomiting.  She also describes a burning sensation in her mid abdomen which is exacerbated by eating.  Takes a daily baba asa but no other NSAIDs.  No black stools   Additionally, Selena Farrell has been having problems with constipation which has led to rectal bleeding.  She has never had a problem with constipation, especially since her cholecystectomy years ago.  She is having rectal bleeding with bowel movements.  She takes milk of magnesia which helps but takes a day or so to work.  She took MiraLAX this past Monday and a second dose on Wednesday.  She still has not had a bowel movement (last one was a week ago).  No recent medication changes to which constipation can be attributed .    Data Reviewed: Patient says that she had labs 3 weeks ago with her PCP.  It sounds like her cholesterol was checked.  She mentions that her white count was high   PREVIOUS GI EVALUATIONS:   September 2012 EGD --Several areas of salmon-colored mucosa in the esophagus ( not biopsied). Gastritis.  Duodenitis.  Biopsies compatible with moderate chronic active H. pylori gastritis  March 2014 EGD for RUQ pain --Esophagus appeared normal.  --Mild gastropathy  April 2014 colonoscopy --Ulcer at IC valve, 2 sessile polyps at the rectosigmoid colon were removed, large external hemorrhoids.  IC valve biopsies compatible with surface erosion.  Polyps were hyperplastic  March 2019 ERCP --The entire main bile duct was moderately dilated, with stones causing an  obstruction. - The patient has had a cholecystectomy. - Choledocholithiasis was found - A biliary sphincterotomy was performed. - The biliary tree was swept with successful removal of all stones and excellent drainage.   Past Medical History:  Diagnosis Date   Alcohol dependence [facility])    Quit Sept. 2012    Bronchitis    Choledocholithiasis 06/23/2011   ERCP, shincterotomy and baloon stone extraction by Dr Fuller Plan.    Cough 07/29/2013   Delayed gastric emptying    Depression    External hemorrhoid    Gastric ulcer 05/2011   EGD-Dr. Hilarie Fredrickson    Gastritis 05/2011   EGD-Dr. Hilarie Fredrickson    GERD (gastroesophageal reflux disease)    Helicobacter pylori (H. pylori) infection    Hx of    Hyperplastic colon polyp    Hypertension    Hyponatremia    Internal hemorrhoids    Nicotine dependence    Tenosynovitis    right MC joint     Past Surgical History:  Procedure Laterality Date   APPENDECTOMY     cervical neck fusion  03/2004   C5-C6 anterior discectomy and fusion with allograft   CESAREAN SECTION     x1   CHOLECYSTECTOMY N/A 11/29/2012   Procedure: LAPAROSCOPIC CHOLECYSTECTOMY WITH INTRAOPERATIVE CHOLANGIOGRAM;  Surgeon: Earnstine Regal, MD;  Location: [facility];  Service: General;  Laterality: N/A;   ERCP N/A 11/10/2017   Procedure: ENDOSCOPIC RETROGRADE CHOLANGIOPANCREATOGRAPHY (ERCP);  Surgeon: Irene Shipper, MD;  Location: [facility];  Service: Endoscopy;  Laterality: N/A;   ERCP W/ SPHICTEROTOMY  06/23/2011   ERCP, shincterotomy and baloon stone extraction by Dr Fuller Plan. 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    ESOPHAGOGASTRODUODENOSCOPY N/A 11/28/2012   Procedure: ESOPHAGOGASTRODUODENOSCOPY (EGD);  Surgeon: Jerene Bears, MD;  Location: [facility];  Service: Gastroenterology;  Laterality: N/A;   Family History  Problem Relation Age of Onset   Alcohol abuse Mother    Stroke Father    Cancer Maternal Grandfather        prostate   Aneurysm Sister    Colon cancer Neg Hx     Social History   Tobacco Use   Smoking status: Every Day    Packs/day: 0.50    Years: 25.00    Pack years: 12.50    Types: Cigarettes   Smokeless tobacco: Never   Tobacco comments:    Counseling to quit smoking given to patient in exam room   Substance Use Topics   Alcohol use: No    Comment: Heavy drinker quit  Sept 2012    Drug use: No   Current Outpatient Medications  Medication Sig Dispense Refill   aspirin EC 81 MG tablet Take 1 tablet (81 mg total) by mouth daily. 30 tablet 0   atenolol (TENORMIN) 25 MG tablet Take 1 tablet (25 mg total) by mouth daily. For High blood pressure     gabapentin (NEURONTIN) 300 MG capsule Take 1 capsule (300 mg total) by mouth 3 (three) times daily. For substance withdrawal syndrome/pain management (Patient taking differently: Take 300 mg by mouth  2 (two) times daily. For substance withdrawal syndrome/pain management) 90 capsule 0   hydrOXYzine (ATARAX/VISTARIL) 25 MG tablet Take 25 mg (1 tablet) three times daily as needed for anxiety 90 tablet 0   Melatonin 5 MG TABS Take 1 tablet by mouth at bedtime.     omeprazole (PRILOSEC) 20 MG capsule TAKE 2 CAPSULES BY MOUTH EVERY DAY (Patient taking differently: Take 20 mg by mouth 2 (two) times daily before a meal. ) 60 capsule 2   traZODone (DESYREL) 150 MG tablet Take 150 mg by mouth at bedtime.   5   No current facility-administered medications for this visit.   Allergies  Allergen Reactions   Aspirin Other (See Comments)    Pt has bleeding ulcer, not allowed to take aspirin   Celebrex [Celecoxib] Nausea Only   Nsaids Nausea Only   Vicodin [Hydrocodone-Acetaminophen] Nausea And Vomiting    Can take with antiemetics.     Review of Systems: Positive for dizziness.  All other systems reviewed and negative except where noted in HPI.    PHYSICAL EXAM :    Wt Readings from Last 3 Encounters:  03/05/19 173 lb 4.8 oz (78.6 kg)  02/19/19 180 lb (81.6 kg)  01/26/18 184 lb (83.5 kg)    BP  (!) 94/58 (BP Location: Left Arm, Patient Position: Sitting, Cuff Size: Normal)   Pulse 64   Ht 5' 2.5" (1.588 m) Comment: height measured without shoes  Wt 182 lb (82.6 kg)   BMI 32.76 kg/m  Constitutional:  Pleasant female in no acute distress. Psychiatric: Normal mood and affect. Behavior is normal. EENT: Pupils normal.  Conjunctivae are normal. No scleral icterus. Neck supple.  Cardiovascular: Normal rate, regular rhythm. No edema Pulmonary/chest: Effort normal and breath sounds normal. No wheezing, rales or rhonchi. Abdominal: Soft, nondistended, nontender. Bowel sounds active throughout. There are no masses palpable. No hepatomegaly. Musculoskeletal: + Carnett's sign in RUQ. Right mid back muscles tender to palpation Neurological: Alert and oriented to person place and time. Skin: Skin is warm and dry. No rashes noted.  Tye Savoy, NP  04/16/2021, 8:43 AM

## 2021-04-16 NOTE — Patient Instructions (Addendum)
If you are age 61 or younger, your body mass index should be between 19-25. Your Body mass index is 32.76 kg/m. If this is out of the aformentioned range listed, please consider follow up with your Primary Care Provider.  _________________________________________________________  The [facility] providers would like to encourage you to use [facility] to communicate with providers for non-urgent requests or questions.  Due to long hold times on the telephone, sending your provider a message by [facility] may be a faster and more efficient way to get a response.  Please allow 48 business hours for a response.  Please remember that this is for non-urgent requests.   Your provider has requested that you go to the basement level for lab work before leaving today. Press "B" on the elevator. The lab is located at the first door on the left as you exit the elevator.  You have been scheduled for an endoscopy and colonoscopy. Please follow the written instructions given to you at your visit today. Please pick up your prep supplies at the pharmacy within the next 1-3 days. If you use inhalers (even only as needed), please bring them with you on the day of your procedure.  Due to recent changes in healthcare laws, you may see the results of your imaging and laboratory studies on MyChart before your provider has had a chance to review them.  We understand that in some cases there may be results that are confusing or concerning to you. Not all laboratory results come back in the same time frame and the provider may be waiting for multiple results in order to interpret others.  Please give [place] 48 hours in order for your provider to thoroughly review all the results before contacting the office for clarification of your results.   Please purchase the following medications over the counter and take as directed:  START: Miralax in 8 ounces of water daily.  We have sent the following medications to your pharmacy for you to  pick up at your convenience:  START: Anusol cream at night for 10 days.  Thank you for entrusting me with your care and choosing [facility].  Tye Savoy, NP

## 2021-04-18 NOTE — Progress Notes (Signed)
Addendum: Reviewed and agree with assessment and management plan. Aline Wesche M, MD  

## 2021-04-21 ENCOUNTER — Ambulatory Visit (HOSPITAL_COMMUNITY)
Admission: RE | Admit: 2021-04-21 | Discharge: 2021-04-21 | Disposition: A | Discharge status: Home / Self Care | Payer: BC Managed Care – PPO | Source: Ambulatory Visit | Attending: Nurse Practitioner | Admitting: Nurse Practitioner

## 2021-04-21 ENCOUNTER — Other Ambulatory Visit: Payer: Self-pay

## 2021-04-21 DIAGNOSIS — R748 Abnormal levels of other serum enzymes: Secondary | ICD-10-CM | POA: Insufficient documentation

## 2021-04-21 DIAGNOSIS — R1011 Right upper quadrant pain: Secondary | ICD-10-CM

## 2021-04-23 ENCOUNTER — Other Ambulatory Visit: Payer: Self-pay

## 2021-04-23 DIAGNOSIS — R748 Abnormal levels of other serum enzymes: Secondary | ICD-10-CM

## 2021-04-23 DIAGNOSIS — R1084 Generalized abdominal pain: Secondary | ICD-10-CM

## 2021-04-26 ENCOUNTER — Telehealth: Payer: Self-pay

## 2021-04-26 ENCOUNTER — Other Ambulatory Visit (INDEPENDENT_AMBULATORY_CARE_PROVIDER_SITE_OTHER): Payer: BC Managed Care – PPO

## 2021-04-26 DIAGNOSIS — R1011 Right upper quadrant pain: Secondary | ICD-10-CM

## 2021-04-26 DIAGNOSIS — R748 Abnormal levels of other serum enzymes: Secondary | ICD-10-CM

## 2021-04-26 LAB — GAMMA GT: GGT: 108 U/L — ABNORMAL HIGH (ref 7–51)

## 2021-04-26 NOTE — Telephone Encounter (Signed)
Called the patient and explained the plan. She agrees to this. She will try to come today for her blood work.

## 2021-04-26 NOTE — Telephone Encounter (Signed)
-----  Message from Willia Craze, NP sent at 04/26/2021 12:21 PM EDT ----- Eustaquio Maize, as this point I would just bring her in for the labs. I don't think Pyrtle wants to add anything else as I didn't hear back from him. So to recap:  Lets get alk phos isoenzymes, GGT and I think the other day I sent a message about adding an AMA as well. You can let her know the [place] looked okay. Thanks    ----- Message ----- From: Greggory Keen, LPN Sent: 8/0/3212   2:09 PM EDT To: Willia Craze, NP  I will wait on bringing her in for labs until we know if he wants to add labs. I would like to keep her from coming to the lab twice because of added labs. I will call her and tell her the plan. ----- Message ----- From: Willia Craze, NP Sent: 04/16/2021   1:34 PM EDT To: Greggory Keen, LPN  Beth, please let patient know that her WBC is elevated. I don't really have a good baseline on her because previous leukocytosis in setting of hospitalizations / illnesses.   1. Her alk phos is chronically elevated which may not be liver related but with her pain and biliary history please schedule a RUQ [place].   2. Pease ask her to come in for alk phos isoenzymes and GGT to see if alk phos elevation is liver related.   3. She is already scheduled for EGD /  colon in Sept  4. Will forward to Dr. Hilarie Fredrickson to see if he wants to add anything  Thanks

## 2021-04-28 LAB — ALKALINE PHOSPHATASE, ISOENZYMES
Alkaline Phosphatase: 207 IU/L — ABNORMAL HIGH (ref 44–121)
BONE FRACTION: 17 % (ref 14–68)
INTESTINAL FRAC.: 2 % (ref 0–18)
LIVER FRACTION: 81 % (ref 18–85)

## 2021-04-28 LAB — ANTI-SMOOTH MUSCLE ANTIBODY, IGG: Actin (Smooth Muscle) Antibody (IGG): <20 U (ref <20)

## 2021-05-04 ENCOUNTER — Telehealth: Payer: Self-pay

## 2021-05-04 NOTE — Telephone Encounter (Signed)
-----  Message from Selena Craze, NP sent at 05/04/2021 12:31 PM EDT ----- Eustaquio Maize,  Given the elevated alk phos / GGT and history of CBD stones Dr. Hilarie Fredrickson is recommending MRI abd with and without contrast + MRC  Thanks, PG

## 2021-05-05 ENCOUNTER — Other Ambulatory Visit: Payer: Self-pay

## 2021-05-05 DIAGNOSIS — R748 Abnormal levels of other serum enzymes: Secondary | ICD-10-CM

## 2021-05-05 DIAGNOSIS — R1084 Generalized abdominal pain: Secondary | ICD-10-CM

## 2021-05-05 DIAGNOSIS — K8050 Calculus of bile duct without cholangitis or cholecystitis without obstruction: Secondary | ICD-10-CM

## 2021-05-05 NOTE — Telephone Encounter (Signed)
Spoke with the patient. She agrees to this plan of care. Order in [place] for [facility]. Patient will wait for the scheduler's call.

## 2021-05-20 ENCOUNTER — Other Ambulatory Visit: Payer: Self-pay | Admitting: Nurse Practitioner

## 2021-05-20 ENCOUNTER — Ambulatory Visit (HOSPITAL_COMMUNITY): Payer: BC Managed Care – PPO

## 2021-05-20 DIAGNOSIS — R748 Abnormal levels of other serum enzymes: Secondary | ICD-10-CM

## 2021-05-20 DIAGNOSIS — K8050 Calculus of bile duct without cholangitis or cholecystitis without obstruction: Secondary | ICD-10-CM

## 2021-05-20 DIAGNOSIS — R1084 Generalized abdominal pain: Secondary | ICD-10-CM

## 2021-05-23 ENCOUNTER — Ambulatory Visit (HOSPITAL_COMMUNITY)
Admission: RE | Admit: 2021-05-23 | Discharge: 2021-05-23 | Disposition: A | Discharge status: Home / Self Care | Payer: BC Managed Care – PPO | Source: Ambulatory Visit | Attending: Nurse Practitioner | Admitting: Nurse Practitioner

## 2021-05-23 DIAGNOSIS — K8050 Calculus of bile duct without cholangitis or cholecystitis without obstruction: Secondary | ICD-10-CM | POA: Insufficient documentation

## 2021-05-23 DIAGNOSIS — R748 Abnormal levels of other serum enzymes: Secondary | ICD-10-CM | POA: Diagnosis present

## 2021-05-23 DIAGNOSIS — R1084 Generalized abdominal pain: Secondary | ICD-10-CM | POA: Insufficient documentation

## 2021-05-23 MED ORDER — GADOBUTROL 1 MMOL/ML IV SOLN
8.0000 mL | Freq: Once | INTRAVENOUS | Status: AC | PRN
  Administered 2021-05-23: 8 mL via INTRAVENOUS

## 2021-05-24 ENCOUNTER — Telehealth: Payer: Self-pay | Admitting: Nurse Practitioner

## 2021-05-24 NOTE — Telephone Encounter (Signed)
Advised the patient no masses or lesion identified. No mention of obstruction. She reports an "attack" last night. She has pain in the center of her stomach into her back and she feels like she cannot breath or move. She reports it is reminiscent of when she had obstruction from the gallstones. No nausea or vomiting.

## 2021-05-24 NOTE — Telephone Encounter (Signed)
Patient calling for imaging results

## 2021-05-25 ENCOUNTER — Other Ambulatory Visit: Payer: BC Managed Care – PPO

## 2021-05-27 ENCOUNTER — Other Ambulatory Visit: Payer: Self-pay

## 2021-05-27 ENCOUNTER — Ambulatory Visit (AMBULATORY_SURGERY_CENTER): Payer: BC Managed Care – PPO | Admitting: Internal Medicine

## 2021-05-27 ENCOUNTER — Encounter: Payer: Self-pay | Admitting: Internal Medicine

## 2021-05-27 VITALS — BP 127/64 | HR 66 | Temp 98.9°F | Resp 17 | Ht 62.0 in | Wt 182.0 lb

## 2021-05-27 DIAGNOSIS — R1013 Epigastric pain: Secondary | ICD-10-CM

## 2021-05-27 DIAGNOSIS — D125 Benign neoplasm of sigmoid colon: Secondary | ICD-10-CM

## 2021-05-27 DIAGNOSIS — R1011 Right upper quadrant pain: Secondary | ICD-10-CM

## 2021-05-27 DIAGNOSIS — K2970 Gastritis, unspecified, without bleeding: Secondary | ICD-10-CM | POA: Diagnosis not present

## 2021-05-27 DIAGNOSIS — K5900 Constipation, unspecified: Secondary | ICD-10-CM

## 2021-05-27 DIAGNOSIS — D124 Benign neoplasm of descending colon: Secondary | ICD-10-CM | POA: Diagnosis not present

## 2021-05-27 DIAGNOSIS — K219 Gastro-esophageal reflux disease without esophagitis: Secondary | ICD-10-CM | POA: Diagnosis not present

## 2021-05-27 DIAGNOSIS — K319 Disease of stomach and duodenum, unspecified: Secondary | ICD-10-CM | POA: Diagnosis not present

## 2021-05-27 DIAGNOSIS — K639 Disease of intestine, unspecified: Secondary | ICD-10-CM | POA: Diagnosis not present

## 2021-05-27 DIAGNOSIS — K2990 Gastroduodenitis, unspecified, without bleeding: Secondary | ICD-10-CM

## 2021-05-27 DIAGNOSIS — K5730 Diverticulosis of large intestine without perforation or abscess without bleeding: Secondary | ICD-10-CM

## 2021-05-27 DIAGNOSIS — R194 Change in bowel habit: Secondary | ICD-10-CM | POA: Diagnosis not present

## 2021-05-27 DIAGNOSIS — K625 Hemorrhage of anus and rectum: Secondary | ICD-10-CM

## 2021-05-27 DIAGNOSIS — R110 Nausea: Secondary | ICD-10-CM

## 2021-05-27 MED ORDER — SODIUM CHLORIDE 0.9 % IV SOLN: 500.0000 mL | Freq: Once | INTRAVENOUS | Status: DC

## 2021-05-27 MED ORDER — LINACLOTIDE 145 MCG PO CAPS: 145.0000 ug | ORAL_CAPSULE | Freq: Every day | ORAL | 11 refills | Status: AC

## 2021-05-27 NOTE — Op Note (Signed)
[facility] Patient Name: Selena Farrell Procedure Date: 05/27/2021 3:41 PM MRN: [id number] Endoscopist: Jerene Bears , MD Age: 60 Referring MD:  Date of Birth: 09/15/1960 Gender: Female Account #: [id number] Procedure:                Upper GI endoscopy Indications:              Epigastric abdominal pain, Abdominal pain in the                            right upper quadrant, Nausea, history of remote H.                            Pylori (Treated), history of gallstones with CBD                            stones s/p lap chole and ERCP with stone extraction                            (2019) Medicines:                Monitored Anesthesia Care Procedure:                Pre-Anesthesia Assessment:                           - Prior to the procedure, a History and Physical                            was performed, and patient medications and                            allergies were reviewed. The patient's tolerance of                            previous anesthesia was also reviewed. The risks                            and benefits of the procedure and the sedation                            options and risks were discussed with the patient.                            All questions were answered, and informed consent                            was obtained. Prior Anticoagulants: The patient has                            taken no previous anticoagulant or antiplatelet                            agents. ASA Grade Assessment: III - A patient with  severe systemic disease. After reviewing the risks                            and benefits, the patient was deemed in                            satisfactory condition to undergo the procedure.                           After obtaining informed consent, the endoscope was                            passed under direct vision. Throughout the                            procedure, the patient's blood pressure, pulse, and                             oxygen saturations were monitored continuously. The                            Endoscope was introduced through the mouth, and                            advanced to the second part of duodenum. The upper                            GI endoscopy was accomplished without difficulty.                            The patient tolerated the procedure well. Scope In: Scope Out: Findings:                 The Z-line was irregular and was found 39 cm from                            the incisors. This was biopsied with a cold forceps                            for evaluation to rule out Barrett's Esophagus.                           Mild inflammation characterized by erythema was                            found in the gastric antrum. Biopsies were taken                            with a cold forceps for histology and Helicobacter                            pylori testing.  A small diverticulum was found in the area of the                            papilla.                           The exam of the duodenum was otherwise normal. Complications:            No immediate complications. Estimated Blood Loss:     Estimated blood loss was minimal. Impression:               - Z-line irregular, 39 cm from the incisors.                            Biopsied.                           - Gastritis. Biopsied.                           - Duodenal diverticulum. Recommendation:           - Patient has a contact number available for                            emergencies. The signs and symptoms of potential                            delayed complications were discussed with the                            patient. Return to normal activities tomorrow.                            Written discharge instructions were provided to the                            patient.                           - Resume previous diet.                           - Continue present  medications.                           - Await pathology results.                           - See the other procedure note for documentation of                            additional recommendations. Jerene Bears, MD 05/27/2021 4:16:49 PM This report has been signed electronically.

## 2021-05-27 NOTE — Progress Notes (Signed)
Called to room to assist during endoscopic procedure.  Patient ID and intended procedure confirmed with present staff. Received instructions for my participation in the procedure from the performing physician.  

## 2021-05-27 NOTE — Patient Instructions (Signed)
Handouts given for Gastritis, polyps, diverticulosis and hemorrhoids.  Await pathology results.  Pick up your new prescription at your pharmacy.  Let Dr. Hilarie Fredrickson know if it works after 2-3 weeks as he may adjust the dosage.    YOU HAD AN ENDOSCOPIC PROCEDURE TODAY AT [facility]:   Refer to the procedure report that was given to you for any specific questions about what was found during the examination.  If the procedure report does not answer your questions, please call your gastroenterologist to clarify.  If you requested that your care partner not be given the details of your procedure findings, then the procedure report has been included in a sealed envelope for you to review at your convenience later.  YOU SHOULD EXPECT: Some feelings of bloating in the abdomen. Passage of more gas than usual.  Walking can help get rid of the air that was put into your GI tract during the procedure and reduce the bloating. If you had a lower endoscopy (such as a colonoscopy or flexible sigmoidoscopy) you may notice spotting of blood in your stool or on the toilet paper. If you underwent a bowel prep for your procedure, you may not have a normal bowel movement for a few days.  Please Note:  You might notice some irritation and congestion in your nose or some drainage.  This is from the oxygen used during your procedure.  There is no need for concern and it should clear up in a day or so.  SYMPTOMS TO REPORT IMMEDIATELY:  Following lower endoscopy (colonoscopy or flexible sigmoidoscopy):  Excessive amounts of blood in the stool  Significant tenderness or worsening of abdominal pains  Swelling of the abdomen that is new, acute  Fever of 100F or higher  Following upper endoscopy (EGD)  Vomiting of blood or coffee ground material  New chest pain or pain under the shoulder blades  Painful or persistently difficult swallowing  New shortness of breath  Fever of 100F or higher  Black,  tarry-looking stools  For urgent or emergent issues, a gastroenterologist can be reached at any hour by calling [phone]. Do not use MyChart messaging for urgent concerns.    DIET:  We do recommend a small meal at first, but then you may proceed to your regular diet.  Drink plenty of fluids but you should avoid alcoholic beverages for 24 hours.  ACTIVITY:  You should plan to take it easy for the rest of today and you should NOT DRIVE or use heavy machinery until tomorrow (because of the sedation medicines used during the test).    FOLLOW UP: Our staff will call the number listed on your records 48-72 hours following your procedure to check on you and address any questions or concerns that you may have regarding the information given to you following your procedure. If we do not reach you, we will leave a message.  We will attempt to reach you two times.  During this call, we will ask if you have developed any symptoms of COVID 19. If you develop any symptoms (ie: fever, flu-like symptoms, shortness of breath, cough etc.) before then, please call [phone].  If you test positive for Covid 19 in the 2 weeks post procedure, please call and report this information to [place].    If any biopsies were taken you will be contacted by phone or by letter within the next 1-3 weeks.  Please call us at [phone] if you have not heard about  the biopsies in 3 weeks.    SIGNATURES/CONFIDENTIALITY: You and/or your care partner have signed paperwork which will be entered into your electronic medical record.  These signatures attest to the fact that that the information above on your After Visit Summary has been reviewed and is understood.  Full responsibility of the confidentiality of this discharge information lies with you and/or your care-partner.

## 2021-05-27 NOTE — Progress Notes (Signed)
Patient ID: Selena Farrell, female   DOB: 26-Mar-1961, 60 y.o.   MRN: [id number]    GASTROENTEROLOGY PROCEDURE H&P NOTE   Primary Care Physician: Janie Morning, DO    Reason for Procedure:  Right upper quadrant and epigastric abdominal pain, nausea, remote history of H. pylori and peptic ulcer disease, change in bowel habits and rectal bleeding  Plan:    Upper endoscopy and colonoscopy  Patient is appropriate for endoscopic procedure(s) in the ambulatory ([place]) setting.  The nature of the procedure, as well as the risks, benefits, and alternatives were carefully and thoroughly reviewed with the patient. Ample time for discussion and questions allowed. The patient understood, was satisfied, and agreed to proceed.     HPI: Selena Farrell is a 60 y.o. female who presents for upper endoscopy and colonoscopy to evaluate symptoms as above.  Seen in the office by Tye Savoy on 04/16/2021.  MRI/MRCP did not show evidence of pancreatitis or choledocholithiasis.  She is status post cholecystectomy and prior ERCP for CBD stone extraction.  No change in symptoms significantly since office visit.  No complaints today including no recent chest pain or shortness of breath.  Past Medical History:  Diagnosis Date   Alcohol dependence [facility])    Quit Sept. 2012    Allergy    Anxiety    Bronchitis    Cataract    Choledocholithiasis 06/23/2011   ERCP, shincterotomy and baloon stone extraction by Dr Fuller Plan.    Cough 07/29/2013   Delayed gastric emptying    Depression    External hemorrhoid    Gastric ulcer 05/14/2011   EGD-Dr. Hilarie Fredrickson    Gastritis 05/14/2011   EGD-Dr. Hilarie Fredrickson    GERD (gastroesophageal reflux disease)    Helicobacter pylori (H. pylori) infection    Hx of    Hyperplastic colon polyp    Hypertension    Hyponatremia    Internal hemorrhoids    Nicotine dependence    Tenosynovitis    right MC joint    Past Surgical History:  Procedure Laterality Date   APPENDECTOMY      cervical neck fusion  03/2004   C5-C6 anterior discectomy and fusion with allograft   CESAREAN SECTION     x1   CHOLECYSTECTOMY N/A 11/29/2012   Procedure: LAPAROSCOPIC CHOLECYSTECTOMY WITH INTRAOPERATIVE CHOLANGIOGRAM;  Surgeon: Earnstine Regal, MD;  Location: [facility];  Service: General;  Laterality: N/A;   ERCP N/A 11/10/2017   Procedure: ENDOSCOPIC RETROGRADE CHOLANGIOPANCREATOGRAPHY (ERCP);  Surgeon: Irene Shipper, MD;  Location: [facility];  Service: Endoscopy;  Laterality: N/A;   ERCP W/ SPHICTEROTOMY  06/23/2011   ERCP, shincterotomy and baloon stone extraction by Dr Fuller Plan. 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    ESOPHAGOGASTRODUODENOSCOPY N/A 11/28/2012   Procedure: ESOPHAGOGASTRODUODENOSCOPY (EGD);  Surgeon: Jerene Bears, MD;  Location: [facility];  Service: Gastroenterology;  Laterality: N/A;    Prior to Admission medications   Medication Sig Start Date End Date Taking? Authorizing Provider  gabapentin (NEURONTIN) 300 MG capsule Take 1 capsule (300 mg total) by mouth 3 (three) times daily. For substance withdrawal syndrome/pain management Patient taking differently: Take 300 mg by mouth 2 (two) times daily. For substance withdrawal syndrome/pain management 01/01/14  Yes Lindell Spar I, NP  hydrOXYzine (ATARAX/VISTARIL) 25 MG tablet Take 25 mg (1 tablet) three times daily as needed for anxiety 01/01/14  Yes Nwoko, Herbert Pun I, NP  ondansetron (ZOFRAN) 8 MG tablet Take 1 tablet by mouth as needed. 04/01/21  Yes [provider]  pravastatin (PRAVACHOL) 10 MG tablet Take 10 mg by mouth daily. 03/31/21  Yes [provider]  traZODone (DESYREL) 150 MG tablet Take 150 mg by mouth at bedtime. 11/06/17  Yes [provider]  aspirin EC 81 MG tablet Take 1 tablet (81 mg total) by mouth daily. 02/19/19   Charlesetta Shanks, MD  atenolol (TENORMIN) 25 MG tablet Take 1 tablet (25 mg total) by mouth daily. For High blood pressure Patient not taking: Reported on 05/27/2021  01/01/14   Lindell Spar I, NP  Melatonin 5 MG TABS Take 1 tablet by mouth at bedtime. Patient not taking: Reported on 05/27/2021    [provider]  omeprazole (PRILOSEC) 20 MG capsule Take 20 mg by mouth 2 (two) times daily.    [provider]    Current Outpatient Medications  Medication Sig Dispense Refill   gabapentin (NEURONTIN) 300 MG capsule Take 1 capsule (300 mg total) by mouth 3 (three) times daily. For substance withdrawal syndrome/pain management (Patient taking differently: Take 300 mg by mouth 2 (two) times daily. For substance withdrawal syndrome/pain management) 90 capsule 0   hydrOXYzine (ATARAX/VISTARIL) 25 MG tablet Take 25 mg (1 tablet) three times daily as needed for anxiety 90 tablet 0   ondansetron (ZOFRAN) 8 MG tablet Take 1 tablet by mouth as needed.     pravastatin (PRAVACHOL) 10 MG tablet Take 10 mg by mouth daily.     traZODone (DESYREL) 150 MG tablet Take 150 mg by mouth at bedtime.  5   aspirin EC 81 MG tablet Take 1 tablet (81 mg total) by mouth daily. 30 tablet 0   atenolol (TENORMIN) 25 MG tablet Take 1 tablet (25 mg total) by mouth daily. For High blood pressure (Patient not taking: Reported on 05/27/2021)     Melatonin 5 MG TABS Take 1 tablet by mouth at bedtime. (Patient not taking: Reported on 05/27/2021)     omeprazole (PRILOSEC) 20 MG capsule Take 20 mg by mouth 2 (two) times daily.     Current Facility-Administered Medications  Medication Dose Route Frequency Provider Last Rate Last Admin   0.9 %  sodium chloride infusion  500 mL Intravenous Once Sashia Campas, Lajuan Lines, MD        Allergies as of 05/27/2021 - Review Complete 05/27/2021  Allergen Reaction Noted   Aspirin Other (See Comments) 06/14/2011   Celebrex [celecoxib] Nausea Only 09/17/2013   Nsaids Nausea Only 09/17/2013   Vicodin [hydrocodone-acetaminophen] Nausea And Vomiting 06/14/2011    Family History  Problem Relation Age of Onset   Alcohol abuse Mother    Stroke Father     Aneurysm Sister    Cancer Maternal Grandfather        prostate   Colon cancer Neg Hx    Esophageal cancer Neg Hx    Stomach cancer Neg Hx     Social History   Socioeconomic History   Marital status: Widowed    Spouse name: Not on file   Number of children: 1   Years of education: GED   Highest education level: Not on file  Occupational History    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  Other Topics Concern   Not on file  Social History Narrative   Patient is widowed.   Patient is living alone.   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   Patient drinks 3-4 cups of caffeine daily.   Patient has her GED.   Patient is right-handed.   Social Determinants of Health   Financial Resource Strain: Not on file  Food Insecurity: Not on file  Transportation Needs: Not on file  Physical Activity: Not on file  Stress: Not on file  Social Connections: Not on file  Intimate Partner Violence: Not on file    Physical Exam: Vital signs in last 24 hours: '@BP'$  (!) 116/59   Pulse 78   Temp 98.9 F (37.2 C)   Ht '5\' 2"'$  (1.575 m)   Wt 182 lb (82.6 kg)   SpO2 96%   BMI 33.29 kg/m  GEN: NAD EYE: Sclerae anicteric ENT: MMM CV: Non-tachycardic Pulm: CTA b/l GI: Soft, NT/ND NEURO:  Alert & Oriented x 3   Zenovia Jarred, MD [facility]  05/27/2021 3:33 PM

## 2021-05-27 NOTE — Op Note (Signed)
[place] Patient Name: Layce Ryall Procedure Date: 05/27/2021 3:41 PM MRN: [id number] Endoscopist: Jerene Bears , MD Age: 60 Referring MD:  Date of Birth: [birth year] Gender: Female Account #: [id number] Procedure:                Colonoscopy Indications:              Rectal bleeding, Incidental change in bowel habits                            noted, Incidental constipation noted, last                            colonoscopy 2014 Medicines:                Monitored Anesthesia Care Procedure:                Pre-Anesthesia Assessment:                           - Prior to the procedure, a History and Physical                            was performed, and patient medications and                            allergies were reviewed. The patient's tolerance of                            previous anesthesia was also reviewed. The risks                            and benefits of the procedure and the sedation                            options and risks were discussed with the patient.                            All questions were answered, and informed consent                            was obtained. Prior Anticoagulants: The patient has                            taken no previous anticoagulant or antiplatelet                            agents. ASA Grade Assessment: III - A patient with                            severe systemic disease. After reviewing the risks                            and benefits, the patient was deemed in  satisfactory condition to undergo the procedure.                           After obtaining informed consent, the colonoscope                            was passed under direct vision. Throughout the                            procedure, the patient's blood pressure, pulse, and                            oxygen saturations were monitored continuously. The                            CF [id number] was introduced through the  anus                            and advanced to the cecum, identified by                            appendiceal orifice and ileocecal valve. The                            colonoscopy was performed without difficulty. The                            patient tolerated the procedure well. The quality                            of the bowel preparation was good. The ileocecal                            valve, appendiceal orifice, and rectum were                            photographed. Scope In: 3:59:03 PM Scope Out: 4:12:32 PM Scope Withdrawal Time: 0 hours 11 minutes 5 seconds  Total Procedure Duration: 0 hours 13 minutes 29 seconds  Findings:                 Hemorrhoids were found on perianal exam.                           Two sessile polyps were found in the sigmoid colon                            (1) and descending colon (1). The polyps were 4 to                            5 mm in size. These polyps were removed with a cold                            snare. Resection and retrieval were complete.  Multiple small-mouthed diverticula were found in                            the sigmoid colon and descending colon. Erythema                            was seen in association with the diverticular                            opening. Biopsies were taken with a cold forceps                            for histology to exclude SCAD.                           External (predominantly) and internal hemorrhoids                            were found during retroflexion and during digital                            exam. The hemorrhoids were medium-sized. Complications:            No immediate complications. Estimated Blood Loss:     Estimated blood loss was minimal. Impression:               - Two 4 to 5 mm polyps in the sigmoid colon and in                            the descending colon, removed with a cold snare.                            Resected and retrieved.                            - Mild diverticulosis in the sigmoid colon and in                            the descending colon. Erythema was seen in                            association with the diverticular opening. Biopsied.                           - Predominantly external with smaller internal                            hemorrhoids. Recommendation:           - Patient has a contact number available for                            emergencies. The signs and symptoms of potential  delayed complications were discussed with the                            patient. Return to normal activities tomorrow.                            Written discharge instructions were provided to the                            patient.                           - Resume previous diet.                           - Continue present medications.                           - Trial of Linzess 145 mcg daily for constipation.                            This can replace MiraLax. Please let me know after                            2-3 weeks if this is helpful. Also call if diarrhea                            results as the dose can be titrated for best                            results.                           - Await pathology results.                           - Repeat colonoscopy is recommended. The                            colonoscopy date will be determined after pathology                            results from today's exam become available for                            review. Jerene Bears, MD 05/27/2021 4:21:17 PM This report has been signed electronically.

## 2021-05-31 ENCOUNTER — Telehealth: Payer: Self-pay | Admitting: *Deleted

## 2021-05-31 NOTE — Telephone Encounter (Signed)
  Follow up Call-  Call back number 05/27/2021  Post procedure Call Back phone  # [phone]  Permission to leave phone message Yes  Some recent data might be hidden     Patient questions:  Do you have a fever, pain , or abdominal swelling? No. Pain Score  0 *  Have you tolerated food without any problems? Yes.    Have you been able to return to your normal activities? Yes.    Do you have any questions about your discharge instructions: Diet   No. Medications  No. Follow up visit  No.  Do you have questions or concerns about your Care? No.  Actions: * If pain score is 4 or above: No action needed, pain <4.  Have you developed a fever since your procedure? no  2.   Have you had an respiratory symptoms (SOB or cough) since your procedure? no  3.   Have you tested positive for COVID 19 since your procedure no  4.   Have you had any family members/close contacts diagnosed with the COVID 19 since your procedure?  no   If yes to any of these questions please route to Joylene John, RN and Joella Prince, RN

## 2021-06-02 ENCOUNTER — Encounter: Payer: Self-pay | Admitting: Internal Medicine

## 2022-08-08 ENCOUNTER — Other Ambulatory Visit: Payer: Self-pay | Admitting: Family Medicine

## 2022-08-08 DIAGNOSIS — Z87891 Personal history of nicotine dependence: Secondary | ICD-10-CM

## 2022-08-19 ENCOUNTER — Encounter: Payer: Self-pay | Admitting: Family Medicine

## 2022-08-22 ENCOUNTER — Ambulatory Visit
Admission: RE | Admit: 2022-08-22 | Discharge: 2022-08-22 | Disposition: A | Discharge status: Home / Self Care | Payer: BC Managed Care – PPO | Source: Ambulatory Visit | Attending: Family Medicine | Admitting: Family Medicine

## 2022-08-22 DIAGNOSIS — Z87891 Personal history of nicotine dependence: Secondary | ICD-10-CM

## 2023-01-17 ENCOUNTER — Encounter: Payer: Self-pay | Admitting: Internal Medicine

## 2023-03-20 ENCOUNTER — Other Ambulatory Visit: Payer: Self-pay | Admitting: Internal Medicine
